# Patient Record
Sex: Female | Born: 1960 | Race: White | Hispanic: No | Smoking: Former smoker
Health system: Southern US, Community
[De-identification: ages and names within clinical notes are randomized; demographics above are authoritative.]

## PROBLEM LIST (undated history)

## (undated) DIAGNOSIS — J449 Chronic obstructive pulmonary disease, unspecified: Secondary | ICD-10-CM

## (undated) DIAGNOSIS — E039 Hypothyroidism, unspecified: Secondary | ICD-10-CM

## (undated) DIAGNOSIS — I10 Essential (primary) hypertension: Secondary | ICD-10-CM

## (undated) DIAGNOSIS — I2510 Atherosclerotic heart disease of native coronary artery without angina pectoris: Secondary | ICD-10-CM

## (undated) DIAGNOSIS — I2699 Other pulmonary embolism without acute cor pulmonale: Secondary | ICD-10-CM

## (undated) HISTORY — PX: LEFT HEART CATH: CATH118248

---

## 1998-09-12 ENCOUNTER — Encounter: Admission: RE | Admit: 1998-09-12 | Discharge: 1998-12-11 | Payer: Self-pay | Admitting: Anesthesiology

## 2002-12-08 DIAGNOSIS — I2510 Atherosclerotic heart disease of native coronary artery without angina pectoris: Secondary | ICD-10-CM

## 2002-12-08 HISTORY — DX: Atherosclerotic heart disease of native coronary artery without angina pectoris: I25.10

## 2003-04-18 ENCOUNTER — Encounter: Payer: Self-pay | Admitting: Cardiovascular Disease

## 2003-04-18 ENCOUNTER — Inpatient Hospital Stay (HOSPITAL_COMMUNITY): Admission: EM | Admit: 2003-04-18 | Discharge: 2003-04-19 | Payer: Self-pay | Admitting: Emergency Medicine

## 2004-06-28 ENCOUNTER — Encounter: Admission: RE | Admit: 2004-06-28 | Discharge: 2004-06-28 | Payer: Self-pay | Admitting: Internal Medicine

## 2015-05-09 ENCOUNTER — Emergency Department (HOSPITAL_BASED_OUTPATIENT_CLINIC_OR_DEPARTMENT_OTHER): Payer: Self-pay

## 2015-05-09 ENCOUNTER — Emergency Department (HOSPITAL_BASED_OUTPATIENT_CLINIC_OR_DEPARTMENT_OTHER)
Admission: EM | Admit: 2015-05-09 | Discharge: 2015-05-09 | Disposition: A | Discharge status: Home / Self Care | Payer: Self-pay | Attending: Emergency Medicine | Admitting: Emergency Medicine

## 2015-05-09 ENCOUNTER — Encounter (HOSPITAL_BASED_OUTPATIENT_CLINIC_OR_DEPARTMENT_OTHER): Payer: Self-pay | Admitting: *Deleted

## 2015-05-09 DIAGNOSIS — L539 Erythematous condition, unspecified: Secondary | ICD-10-CM

## 2015-05-09 DIAGNOSIS — I2510 Atherosclerotic heart disease of native coronary artery without angina pectoris: Secondary | ICD-10-CM | POA: Insufficient documentation

## 2015-05-09 DIAGNOSIS — J449 Chronic obstructive pulmonary disease, unspecified: Secondary | ICD-10-CM | POA: Insufficient documentation

## 2015-05-09 DIAGNOSIS — L03119 Cellulitis of unspecified part of limb: Secondary | ICD-10-CM

## 2015-05-09 DIAGNOSIS — L03113 Cellulitis of right upper limb: Secondary | ICD-10-CM | POA: Insufficient documentation

## 2015-05-09 HISTORY — DX: Atherosclerotic heart disease of native coronary artery without angina pectoris: I25.10

## 2015-05-09 HISTORY — DX: Chronic obstructive pulmonary disease, unspecified: J44.9

## 2015-05-09 LAB — BASIC METABOLIC PANEL
Anion gap: 8 (ref 5–15)
BUN: 12 mg/dL (ref 6–20)
CALCIUM: 9.7 mg/dL (ref 8.9–10.3)
CHLORIDE: 107 mmol/L (ref 101–111)
CO2: 27 mmol/L (ref 22–32)
CREATININE: 0.57 mg/dL (ref 0.44–1.00)
GFR calc Af Amer: >60 mL/min (ref >60)
GFR calc non Af Amer: >60 mL/min (ref >60)
Glucose, Bld: 99 mg/dL (ref 65–99)
Potassium: 4.3 mmol/L (ref 3.5–5.1)
Sodium: 142 mmol/L (ref 135–145)

## 2015-05-09 LAB — CBC
HCT: 45.1 % (ref 36.0–46.0)
HEMOGLOBIN: 15.4 g/dL — AB (ref 12.0–15.0)
MCH: 30.8 pg (ref 26.0–34.0)
MCHC: 34.1 g/dL (ref 30.0–36.0)
MCV: 90.2 fL (ref 78.0–100.0)
Platelets: 214 10*3/uL (ref 150–400)
RBC: 5 MIL/uL (ref 3.87–5.11)
RDW: 12.9 % (ref 11.5–15.5)
WBC: 11.4 10*3/uL — ABNORMAL HIGH (ref 4.0–10.5)

## 2015-05-09 MED ORDER — CLINDAMYCIN HCL 150 MG PO CAPS
300.0000 mg | ORAL_CAPSULE | Freq: Once | ORAL | Status: AC
  Administered 2015-05-09: 300 mg via ORAL
  Filled 2015-05-09: qty 2

## 2015-05-09 MED ORDER — CLINDAMYCIN HCL 300 MG PO CAPS: 300.0000 mg | ORAL_CAPSULE | Freq: Three times a day (TID) | ORAL | Status: DC

## 2015-05-09 NOTE — ED Notes (Signed)
NP at bedside.

## 2015-05-09 NOTE — ED Notes (Addendum)
Patient states she had a dog jump up on her two days ago and now has pain, redness, and swelling in there right wrist.

## 2015-05-09 NOTE — Discharge Instructions (Signed)
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
## 2017-09-04 ENCOUNTER — Encounter (HOSPITAL_COMMUNITY): Payer: Self-pay | Admitting: *Deleted

## 2017-09-04 ENCOUNTER — Emergency Department (HOSPITAL_COMMUNITY)
Admission: EM | Admit: 2017-09-04 | Discharge: 2017-09-04 | Disposition: A | Discharge status: Home / Self Care | Payer: No Typology Code available for payment source | Attending: Emergency Medicine | Admitting: Emergency Medicine

## 2017-09-04 ENCOUNTER — Emergency Department (HOSPITAL_COMMUNITY): Payer: No Typology Code available for payment source

## 2017-09-04 DIAGNOSIS — Y999 Unspecified external cause status: Secondary | ICD-10-CM | POA: Diagnosis not present

## 2017-09-04 DIAGNOSIS — M791 Myalgia: Secondary | ICD-10-CM | POA: Diagnosis not present

## 2017-09-04 DIAGNOSIS — I2510 Atherosclerotic heart disease of native coronary artery without angina pectoris: Secondary | ICD-10-CM | POA: Insufficient documentation

## 2017-09-04 DIAGNOSIS — Y9301 Activity, walking, marching and hiking: Secondary | ICD-10-CM | POA: Insufficient documentation

## 2017-09-04 DIAGNOSIS — J449 Chronic obstructive pulmonary disease, unspecified: Secondary | ICD-10-CM | POA: Insufficient documentation

## 2017-09-04 DIAGNOSIS — T07XXXA Unspecified multiple injuries, initial encounter: Secondary | ICD-10-CM | POA: Insufficient documentation

## 2017-09-04 DIAGNOSIS — Y92481 Parking lot as the place of occurrence of the external cause: Secondary | ICD-10-CM | POA: Insufficient documentation

## 2017-09-04 DIAGNOSIS — F17210 Nicotine dependence, cigarettes, uncomplicated: Secondary | ICD-10-CM | POA: Diagnosis not present

## 2017-09-04 DIAGNOSIS — M7918 Myalgia, other site: Secondary | ICD-10-CM

## 2017-09-04 MED ORDER — NAPROXEN 500 MG PO TABS: 500.0000 mg | ORAL_TABLET | Freq: Two times a day (BID) | ORAL | 0 refills | Status: DC

## 2017-09-04 MED ORDER — METHOCARBAMOL 500 MG PO TABS: 1000.0000 mg | ORAL_TABLET | Freq: Four times a day (QID) | ORAL | 0 refills | Status: DC

## 2017-09-04 MED ORDER — METHOCARBAMOL 500 MG PO TABS
500.0000 mg | ORAL_TABLET | Freq: Once | ORAL | Status: AC
  Administered 2017-09-04: 500 mg via ORAL
  Filled 2017-09-04: qty 1

## 2017-09-04 MED ORDER — TRAMADOL HCL 50 MG PO TABS: 50.0000 mg | ORAL_TABLET | Freq: Four times a day (QID) | ORAL | 0 refills | Status: DC | PRN

## 2017-09-04 MED ORDER — OXYCODONE-ACETAMINOPHEN 5-325 MG PO TABS
1.0000 | ORAL_TABLET | Freq: Once | ORAL | Status: AC
  Administered 2017-09-04: 1 via ORAL
  Filled 2017-09-04: qty 1

## 2017-09-04 MED ORDER — LIDOCAINE 5 % EX PTCH: 1.0000 | MEDICATED_PATCH | CUTANEOUS | 0 refills | Status: DC
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
## 2017-09-04 NOTE — ED Notes (Signed)
Pt to radiology via stretcher.  
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
## 2017-09-07 ENCOUNTER — Emergency Department (HOSPITAL_COMMUNITY)
Admission: EM | Admit: 2017-09-07 | Discharge: 2017-09-08 | Disposition: A | Discharge status: Home / Self Care | Payer: No Typology Code available for payment source | Attending: Emergency Medicine | Admitting: Emergency Medicine

## 2017-09-07 ENCOUNTER — Encounter (HOSPITAL_COMMUNITY): Payer: Self-pay

## 2017-09-07 DIAGNOSIS — T148XXA Other injury of unspecified body region, initial encounter: Secondary | ICD-10-CM

## 2017-09-07 DIAGNOSIS — Y998 Other external cause status: Secondary | ICD-10-CM | POA: Diagnosis not present

## 2017-09-07 DIAGNOSIS — F17210 Nicotine dependence, cigarettes, uncomplicated: Secondary | ICD-10-CM | POA: Diagnosis not present

## 2017-09-07 DIAGNOSIS — R51 Headache: Secondary | ICD-10-CM | POA: Diagnosis not present

## 2017-09-07 DIAGNOSIS — S161XXA Strain of muscle, fascia and tendon at neck level, initial encounter: Secondary | ICD-10-CM | POA: Diagnosis not present

## 2017-09-07 DIAGNOSIS — Y92410 Unspecified street and highway as the place of occurrence of the external cause: Secondary | ICD-10-CM | POA: Insufficient documentation

## 2017-09-07 DIAGNOSIS — S199XXA Unspecified injury of neck, initial encounter: Secondary | ICD-10-CM | POA: Diagnosis present

## 2017-09-07 DIAGNOSIS — I2510 Atherosclerotic heart disease of native coronary artery without angina pectoris: Secondary | ICD-10-CM | POA: Diagnosis not present

## 2017-09-07 DIAGNOSIS — Y9301 Activity, walking, marching and hiking: Secondary | ICD-10-CM | POA: Diagnosis not present

## 2017-09-07 DIAGNOSIS — J449 Chronic obstructive pulmonary disease, unspecified: Secondary | ICD-10-CM | POA: Insufficient documentation

## 2017-09-07 HISTORY — DX: Other pulmonary embolism without acute cor pulmonale: I26.99
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
## 2017-09-07 NOTE — ED Notes (Signed)
Bed: [facility]1 Expected date:  Expected time:  Means of arrival:  Comments: 

## 2017-09-07 NOTE — ED Triage Notes (Signed)
When patient was told that she had to go tot he waiting room, her visitor became angry and stated that the patient was having chest pain, patient confirmed.

## 2017-09-08 ENCOUNTER — Encounter (HOSPITAL_COMMUNITY): Payer: Self-pay | Admitting: Emergency Medicine

## 2017-09-08 ENCOUNTER — Emergency Department (HOSPITAL_COMMUNITY): Payer: No Typology Code available for payment source

## 2017-09-08 MED ORDER — DIPHENHYDRAMINE HCL 25 MG PO CAPS
25.0000 mg | ORAL_CAPSULE | Freq: Once | ORAL | Status: AC
  Administered 2017-09-08: 25 mg via ORAL
  Filled 2017-09-08: qty 1

## 2017-09-08 MED ORDER — KETOROLAC TROMETHAMINE 60 MG/2ML IM SOLN
30.0000 mg | Freq: Once | INTRAMUSCULAR | Status: AC
  Administered 2017-09-08: 30 mg via INTRAMUSCULAR
  Filled 2017-09-08: qty 2

## 2017-09-08 MED ORDER — METOCLOPRAMIDE HCL 5 MG/ML IJ SOLN
10.0000 mg | Freq: Once | INTRAMUSCULAR | Status: AC
  Administered 2017-09-08: 10 mg via INTRAMUSCULAR
  Filled 2017-09-08: qty 2

## 2017-09-08 MED ORDER — MELOXICAM 7.5 MG PO TABS: 7.5000 mg | ORAL_TABLET | Freq: Every day | ORAL | 0 refills | Status: DC
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
## 2017-10-15 ENCOUNTER — Ambulatory Visit (INDEPENDENT_AMBULATORY_CARE_PROVIDER_SITE_OTHER): Payer: Self-pay

## 2017-10-15 ENCOUNTER — Ambulatory Visit (INDEPENDENT_AMBULATORY_CARE_PROVIDER_SITE_OTHER): Payer: Self-pay | Admitting: Orthopedic Surgery

## 2017-10-15 ENCOUNTER — Encounter (INDEPENDENT_AMBULATORY_CARE_PROVIDER_SITE_OTHER): Payer: Self-pay | Admitting: Orthopedic Surgery

## 2017-10-15 DIAGNOSIS — M25512 Pain in left shoulder: Secondary | ICD-10-CM

## 2017-10-15 DIAGNOSIS — G8929 Other chronic pain: Secondary | ICD-10-CM

## 2017-10-15 DIAGNOSIS — M545 Low back pain: Secondary | ICD-10-CM

## 2017-10-15 DIAGNOSIS — M5136 Other intervertebral disc degeneration, lumbar region: Secondary | ICD-10-CM
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
## 2017-11-04 ENCOUNTER — Ambulatory Visit (HOSPITAL_COMMUNITY)
Admission: RE | Admit: 2017-11-04 | Discharge: 2017-11-04 | Disposition: A | Discharge status: Home / Self Care | Payer: No Typology Code available for payment source | Source: Ambulatory Visit | Attending: Orthopedic Surgery | Admitting: Orthopedic Surgery

## 2017-11-04 DIAGNOSIS — M5136 Other intervertebral disc degeneration, lumbar region: Secondary | ICD-10-CM | POA: Insufficient documentation

## 2017-11-04 DIAGNOSIS — M47816 Spondylosis without myelopathy or radiculopathy, lumbar region: Secondary | ICD-10-CM | POA: Diagnosis not present

## 2017-11-04 DIAGNOSIS — M1288 Other specific arthropathies, not elsewhere classified, other specified site: Secondary | ICD-10-CM | POA: Insufficient documentation

## 2017-11-04 DIAGNOSIS — M48061 Spinal stenosis, lumbar region without neurogenic claudication: Secondary | ICD-10-CM | POA: Insufficient documentation

## 2017-11-06 ENCOUNTER — Ambulatory Visit (INDEPENDENT_AMBULATORY_CARE_PROVIDER_SITE_OTHER): Payer: Self-pay | Admitting: Orthopedic Surgery

## 2017-11-06 ENCOUNTER — Encounter (INDEPENDENT_AMBULATORY_CARE_PROVIDER_SITE_OTHER): Payer: Self-pay | Admitting: Orthopedic Surgery

## 2017-11-06 DIAGNOSIS — M545 Low back pain: Secondary | ICD-10-CM

## 2017-11-07 ENCOUNTER — Encounter (INDEPENDENT_AMBULATORY_CARE_PROVIDER_SITE_OTHER): Payer: Self-pay | Admitting: Orthopedic Surgery
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
## 2017-11-19 ENCOUNTER — Encounter (INDEPENDENT_AMBULATORY_CARE_PROVIDER_SITE_OTHER): Payer: Self-pay | Admitting: Physical Medicine and Rehabilitation

## 2017-11-19 ENCOUNTER — Ambulatory Visit (INDEPENDENT_AMBULATORY_CARE_PROVIDER_SITE_OTHER): Payer: Self-pay | Admitting: Physical Medicine and Rehabilitation

## 2017-11-19 ENCOUNTER — Ambulatory Visit (INDEPENDENT_AMBULATORY_CARE_PROVIDER_SITE_OTHER): Payer: Self-pay

## 2017-11-19 VITALS — BP 154/87 | HR 98 | Temp 98.0°F

## 2017-11-19 DIAGNOSIS — M4125 Other idiopathic scoliosis, thoracolumbar region: Secondary | ICD-10-CM | POA: Insufficient documentation

## 2017-11-19 DIAGNOSIS — M5416 Radiculopathy, lumbar region: Secondary | ICD-10-CM

## 2017-11-19 MED ORDER — LIDOCAINE HCL (PF) 1 % IJ SOLN
2.0000 mL | Freq: Once | INTRAMUSCULAR | Status: AC
  Administered 2017-11-19: 2 mL

## 2017-11-19 MED ORDER — METHYLPREDNISOLONE ACETATE 80 MG/ML IJ SUSP
80.0000 mg | Freq: Once | INTRAMUSCULAR | Status: AC
  Administered 2017-11-19: 80 mg

## 2017-11-19 NOTE — Patient Instructions (Signed)
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
## 2017-11-27 NOTE — Procedures (Signed)
Cheyenne Lucero is a 56 year old female who is followed by Dr. August Saucerean in our office and is failed conservative care for her low back and right hip pain.  She reports 8 out of 10 pain particularly into the right hip.  Her symptoms are consistent potentially with radicular pain or even facet joint mediated pain.  MRI shows severe scoliosis with facet arthropathy at L4-5 and small listhesis.  There is lateral recess narrowing but no focal nerve compression and no focal stenosis of the central canal.  The injection  will be diagnostic and hopefully therapeutic. The patient has failed conservative care including time, medications and activity modification.  Lumbar Epidural Steroid Injection - Interlaminar Approach with Fluoroscopic Guidance  Patient: Cheyenne Lucero      Date of Birth: May 19, 1961 MRN: [id number] PCP: Patient, No Pcp Per      Visit Date: 11/19/2017   Universal Protocol:     Consent Given By: the patient  Position: PRONE  Additional Comments: Vital signs were monitored before and after the procedure. Patient was prepped and draped in the usual sterile fashion. The correct patient, procedure, and site was verified.   Injection Procedure Details:  Procedure Site One Meds Administered:  Meds ordered this encounter  Medications  . lidocaine (PF) (XYLOCAINE) 1 % injection 2 mL  . methylPREDNISolone acetate (DEPO-MEDROL) injection 80 mg     Laterality: Right  Location/Site:  L5-S1  Needle size: 20 G  Needle type: Tuohy  Needle Placement: Paramedian epidural  Findings:   -Comments: Excellent flow of contrast into the epidural space.  Procedure Details: Using a paramedian approach from the side mentioned above, the region overlying the inferior lamina was localized under fluoroscopic visualization and the soft tissues overlying this structure were infiltrated with 4 ml. of 1% Lidocaine without Epinephrine. The Tuohy needle was inserted into the epidural space using a paramedian  approach.   The epidural space was localized using loss of resistance along with lateral and bi-planar fluoroscopic views.  After negative aspirate for air, blood, and CSF, a 2 ml. volume of Isovue-250 was injected into the epidural space and the flow of contrast was observed. Radiographs were obtained for documentation purposes.    The injectate was administered into the level noted above.   Additional Comments:  The patient tolerated the procedure well Dressing: Band-Aid    Post-procedure details: Patient was observed during the procedure. Post-procedure instructions were reviewed.  Patient left the clinic in stable condition.  Pertinent Imaging: MRI LUMBAR SPINE WITHOUT CONTRAST  TECHNIQUE: Multiplanar, multisequence MR imaging of the lumbar spine was performed. No intravenous contrast was administered.  COMPARISON:  None.  FINDINGS: Segmentation:  Standard.  Alignment: Severe levoscoliosis of the lumbar spine. 2 mm anterolisthesis of L4 on L5 secondary to facet disease.  Vertebrae:  No fracture, evidence of discitis, or bone lesion.  Conus medullaris and cauda equina: Conus extends to the L1 level. Conus and cauda equina appear normal.  Paraspinal and other soft tissues: No paraspinal abnormality. Large left renal cyst is noted measuring 6.2 x 3.8 cm.  Disc levels:  Disc spaces: Disc desiccation throughout the lumbar spine with reactive endplate changes along the right periphery of the L4 vertebral body.  T12-L1: No significant disc bulge. No evidence of neural foraminal stenosis. No central canal stenosis.  L1-L2: No significant disc bulge. No evidence of neural foraminal stenosis. No central canal stenosis.  L2-L3: Mild broad-based disc bulge. No evidence of neural foraminal stenosis. No central canal stenosis.  L3-L4: Mild  broad-based disc bulge. Right lateral recess narrowing. No evidence of neural foraminal stenosis. No central canal  stenosis.  L4-L5: Broad-based disc bulge flattening the ventral thecal sac. Moderate bilateral facet arthropathy with ligamentum flavum infolding. Bilateral lateral recess narrowing. No evidence of neural foraminal stenosis. Mild central canal stenosis.  L5-S1: Mild broad-based disc bulge. Mild bilateral facet arthropathy. Mild left foraminal stenosis. No central canal stenosis.  IMPRESSION: 1. At L4-5 there is a broad-based disc bulge flattening the ventral thecal sac. Moderate bilateral facet arthropathy with ligamentum flavum infolding. Bilateral lateral recess narrowing. Mild central canal stenosis. 2. Otherwise mild lumbar spine spondylosis as detailed above.   Electronically Signed   By: Elige KoHetal  Patel   On: 11/04/2017 12:17

## 2017-12-10 ENCOUNTER — Encounter (INDEPENDENT_AMBULATORY_CARE_PROVIDER_SITE_OTHER): Payer: Self-pay | Admitting: Physical Medicine and Rehabilitation

## 2017-12-10 ENCOUNTER — Ambulatory Visit (INDEPENDENT_AMBULATORY_CARE_PROVIDER_SITE_OTHER): Payer: Self-pay

## 2017-12-10 ENCOUNTER — Ambulatory Visit (INDEPENDENT_AMBULATORY_CARE_PROVIDER_SITE_OTHER): Payer: BLUE CROSS/BLUE SHIELD | Admitting: Physical Medicine and Rehabilitation

## 2017-12-10 VITALS — BP 182/96 | HR 109 | Temp 98.1°F

## 2017-12-10 DIAGNOSIS — M4125 Other idiopathic scoliosis, thoracolumbar region: Secondary | ICD-10-CM

## 2017-12-10 DIAGNOSIS — M47816 Spondylosis without myelopathy or radiculopathy, lumbar region: Secondary | ICD-10-CM | POA: Diagnosis not present

## 2017-12-10 MED ORDER — METHYLPREDNISOLONE ACETATE 80 MG/ML IJ SUSP
80.0000 mg | Freq: Once | INTRAMUSCULAR | Status: AC
  Administered 2017-12-10: 80 mg

## 2017-12-10 NOTE — Patient Instructions (Signed)
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
## 2017-12-24 ENCOUNTER — Telehealth (INDEPENDENT_AMBULATORY_CARE_PROVIDER_SITE_OTHER): Payer: Self-pay | Admitting: Radiology

## 2017-12-25 NOTE — Telephone Encounter (Signed)
See messages below. I have scheduled patient for a follow up with Dr. August Saucerean on 1/30. Injections have not helped. She wants to know if there is anything she can do until that time to help relieve her back pain.

## 2017-12-25 NOTE — Telephone Encounter (Signed)
Not[facility]

## 2017-12-25 NOTE — Telephone Encounter (Signed)
If the facet injections and epidural injection did not help her back pain at all I am not sure have anything from an interventional standpoint that would help.  She should follow-up with Dr. August Saucerean or get his input on her seeing a spine surgeon for evaluation.  Other options would be to regroup with a good physical therapist looking at more of a myofascial relief and pain relief standpoint.

## 2018-01-06 ENCOUNTER — Ambulatory Visit (INDEPENDENT_AMBULATORY_CARE_PROVIDER_SITE_OTHER): Payer: BLUE CROSS/BLUE SHIELD | Admitting: Orthopedic Surgery

## 2018-01-08 ENCOUNTER — Other Ambulatory Visit: Payer: Self-pay | Admitting: Family Medicine

## 2018-01-08 DIAGNOSIS — E039 Hypothyroidism, unspecified: Secondary | ICD-10-CM

## 2018-01-18 ENCOUNTER — Other Ambulatory Visit (HOSPITAL_COMMUNITY): Payer: Self-pay | Admitting: Family Medicine

## 2018-01-18 ENCOUNTER — Ambulatory Visit
Admission: RE | Admit: 2018-01-18 | Discharge: 2018-01-18 | Disposition: A | Discharge status: Home / Self Care | Payer: BLUE CROSS/BLUE SHIELD | Source: Ambulatory Visit | Attending: Family Medicine | Admitting: Family Medicine

## 2018-01-18 DIAGNOSIS — E039 Hypothyroidism, unspecified: Secondary | ICD-10-CM

## 2018-01-21 ENCOUNTER — Other Ambulatory Visit (HOSPITAL_COMMUNITY): Payer: Self-pay | Admitting: Family Medicine

## 2018-01-21 DIAGNOSIS — E0590 Thyrotoxicosis, unspecified without thyrotoxic crisis or storm: Secondary | ICD-10-CM

## 2018-01-25 ENCOUNTER — Other Ambulatory Visit (HOSPITAL_COMMUNITY): Payer: Self-pay | Admitting: Family Medicine

## 2018-01-25 DIAGNOSIS — E0590 Thyrotoxicosis, unspecified without thyrotoxic crisis or storm: Secondary | ICD-10-CM

## 2018-01-26 ENCOUNTER — Ambulatory Visit (HOSPITAL_COMMUNITY)
Admission: RE | Admit: 2018-01-26 | Discharge: 2018-01-26 | Disposition: A | Discharge status: Home / Self Care | Payer: BLUE CROSS/BLUE SHIELD | Source: Ambulatory Visit | Attending: Family Medicine | Admitting: Family Medicine

## 2018-01-26 DIAGNOSIS — E0590 Thyrotoxicosis, unspecified without thyrotoxic crisis or storm: Secondary | ICD-10-CM | POA: Diagnosis present

## 2018-01-27 ENCOUNTER — Encounter (HOSPITAL_COMMUNITY)
Admission: RE | Admit: 2018-01-27 | Discharge: 2018-01-27 | Disposition: A | Discharge status: Home / Self Care | Payer: BLUE CROSS/BLUE SHIELD | Source: Ambulatory Visit | Attending: Family Medicine | Admitting: Family Medicine

## 2018-01-27 MED ORDER — SODIUM PERTECHNETATE TC 99M INJECTION
10.0000 | Freq: Once | INTRAVENOUS | Status: AC | PRN
  Administered 2018-01-27: 10 via INTRAVENOUS

## 2018-01-27 MED ORDER — SODIUM IODIDE I 131 CAPSULE
10.5000 | Freq: Once | INTRAVENOUS | Status: AC | PRN
  Administered 2018-01-26: 10.5 via ORAL

## 2018-01-28 ENCOUNTER — Ambulatory Visit (HOSPITAL_COMMUNITY)
Admission: RE | Admit: 2018-01-28 | Discharge: 2018-01-28 | Disposition: A | Discharge status: Home / Self Care | Payer: BLUE CROSS/BLUE SHIELD | Source: Ambulatory Visit | Attending: Family Medicine | Admitting: Family Medicine

## 2018-01-28 DIAGNOSIS — E0590 Thyrotoxicosis, unspecified without thyrotoxic crisis or storm: Secondary | ICD-10-CM | POA: Diagnosis not present

## 2018-01-28 LAB — HCG, SERUM, QUALITATIVE: Preg, Serum: NEGATIVE

## 2018-01-28 MED ORDER — SODIUM IODIDE I 131 CAPSULE
18.3000 | Freq: Once | INTRAVENOUS | Status: AC | PRN
  Administered 2018-01-28: 18.3 via ORAL

## 2018-02-06 ENCOUNTER — Encounter (HOSPITAL_COMMUNITY): Payer: Self-pay

## 2018-02-06 ENCOUNTER — Inpatient Hospital Stay (HOSPITAL_COMMUNITY)
Admission: EM | Admit: 2018-02-06 | Discharge: 2018-02-09 | DRG: 645 | Disposition: A | Discharge status: Home / Self Care | Payer: BLUE CROSS/BLUE SHIELD | Attending: Internal Medicine | Admitting: Internal Medicine

## 2018-02-06 DIAGNOSIS — F419 Anxiety disorder, unspecified: Secondary | ICD-10-CM | POA: Diagnosis present

## 2018-02-06 DIAGNOSIS — I2510 Atherosclerotic heart disease of native coronary artery without angina pectoris: Secondary | ICD-10-CM | POA: Diagnosis present

## 2018-02-06 DIAGNOSIS — E0590 Thyrotoxicosis, unspecified without thyrotoxic crisis or storm: Secondary | ICD-10-CM | POA: Diagnosis not present

## 2018-02-06 DIAGNOSIS — E0500 Thyrotoxicosis with diffuse goiter without thyrotoxic crisis or storm: Secondary | ICD-10-CM | POA: Diagnosis not present

## 2018-02-06 DIAGNOSIS — E039 Hypothyroidism, unspecified: Secondary | ICD-10-CM | POA: Insufficient documentation

## 2018-02-06 DIAGNOSIS — F17210 Nicotine dependence, cigarettes, uncomplicated: Secondary | ICD-10-CM | POA: Diagnosis present

## 2018-02-06 DIAGNOSIS — F329 Major depressive disorder, single episode, unspecified: Secondary | ICD-10-CM | POA: Diagnosis present

## 2018-02-06 DIAGNOSIS — R002 Palpitations: Secondary | ICD-10-CM | POA: Diagnosis present

## 2018-02-06 DIAGNOSIS — R110 Nausea: Secondary | ICD-10-CM | POA: Diagnosis present

## 2018-02-06 DIAGNOSIS — Z86711 Personal history of pulmonary embolism: Secondary | ICD-10-CM

## 2018-02-06 DIAGNOSIS — F411 Generalized anxiety disorder: Secondary | ICD-10-CM | POA: Diagnosis present

## 2018-02-06 DIAGNOSIS — G8929 Other chronic pain: Secondary | ICD-10-CM | POA: Diagnosis present

## 2018-02-06 DIAGNOSIS — M545 Low back pain: Secondary | ICD-10-CM | POA: Diagnosis present

## 2018-02-06 DIAGNOSIS — J449 Chronic obstructive pulmonary disease, unspecified: Secondary | ICD-10-CM | POA: Diagnosis present

## 2018-02-06 DIAGNOSIS — I10 Essential (primary) hypertension: Secondary | ICD-10-CM | POA: Diagnosis present

## 2018-02-06 DIAGNOSIS — R001 Bradycardia, unspecified: Secondary | ICD-10-CM | POA: Diagnosis not present

## 2018-02-06 LAB — COMPREHENSIVE METABOLIC PANEL
ALBUMIN: 4.3 g/dL (ref 3.5–5.0)
ALK PHOS: 130 U/L — AB (ref 38–126)
ALT: 34 U/L (ref 14–54)
AST: 30 U/L (ref 15–41)
Anion gap: 12 (ref 5–15)
BILIRUBIN TOTAL: 1 mg/dL (ref 0.3–1.2)
BUN: 10 mg/dL (ref 6–20)
CO2: 20 mmol/L — ABNORMAL LOW (ref 22–32)
CREATININE: 0.62 mg/dL (ref 0.44–1.00)
Calcium: 10.3 mg/dL (ref 8.9–10.3)
Chloride: 108 mmol/L (ref 101–111)
GFR calc Af Amer: >60 mL/min (ref >60)
GFR calc non Af Amer: >60 mL/min (ref >60)
GLUCOSE: 102 mg/dL — AB (ref 65–99)
POTASSIUM: 3.9 mmol/L (ref 3.5–5.1)
Sodium: 140 mmol/L (ref 135–145)
Total Protein: 7.6 g/dL (ref 6.5–8.1)

## 2018-02-06 LAB — CBC WITH DIFFERENTIAL/PLATELET
BASOS ABS: 0 10*3/uL (ref 0.0–0.1)
BASOS PCT: 0 %
Eosinophils Absolute: 0 10*3/uL (ref 0.0–0.7)
Eosinophils Relative: 0 %
HEMATOCRIT: 48.4 % — AB (ref 36.0–46.0)
HEMOGLOBIN: 17.4 g/dL — AB (ref 12.0–15.0)
LYMPHS PCT: 33 %
Lymphs Abs: 3.2 10*3/uL (ref 0.7–4.0)
MCH: 32.3 pg (ref 26.0–34.0)
MCHC: 36 g/dL (ref 30.0–36.0)
MCV: 90 fL (ref 78.0–100.0)
Monocytes Absolute: 0.6 10*3/uL (ref 0.1–1.0)
Monocytes Relative: 6 %
NEUTROS ABS: 6 10*3/uL (ref 1.7–7.7)
NEUTROS PCT: 61 %
Platelets: 243 10*3/uL (ref 150–400)
RBC: 5.38 MIL/uL — AB (ref 3.87–5.11)
RDW: 13.4 % (ref 11.5–15.5)
WBC: 9.9 10*3/uL (ref 4.0–10.5)

## 2018-02-06 LAB — T4, FREE: Free T4: 3.06 ng/dL — ABNORMAL HIGH (ref 0.61–1.12)

## 2018-02-06 LAB — MAGNESIUM: Magnesium: 1.9 mg/dL (ref 1.7–2.4)

## 2018-02-06 LAB — I-STAT TROPONIN, ED: Troponin i, poc: 0 ng/mL (ref 0.00–0.08)

## 2018-02-06 LAB — TSH: TSH: <0.01 u[IU]/mL — ABNORMAL LOW (ref 0.350–4.500)

## 2018-02-06 LAB — PHOSPHORUS: Phosphorus: <1 mg/dL — CL (ref 2.5–4.6)

## 2018-02-06 MED ORDER — NEBIVOLOL HCL 10 MG PO TABS
10.0000 mg | ORAL_TABLET | Freq: Two times a day (BID) | ORAL | Status: DC
  Administered 2018-02-06 – 2018-02-07 (×2): 10 mg via ORAL
  Filled 2018-02-06 (×2): qty 1

## 2018-02-06 MED ORDER — SODIUM CHLORIDE 0.9 % IV BOLUS (SEPSIS)
1000.0000 mL | Freq: Once | INTRAVENOUS | Status: AC
  Administered 2018-02-06: 1000 mL via INTRAVENOUS

## 2018-02-06 MED ORDER — METHIMAZOLE 10 MG PO TABS
20.0000 mg | ORAL_TABLET | Freq: Two times a day (BID) | ORAL | Status: DC
  Administered 2018-02-06 – 2018-02-09 (×6): 20 mg via ORAL
  Filled 2018-02-06 (×6): qty 2

## 2018-02-06 MED ORDER — K PHOS MONO-SOD PHOS DI & MONO 155-852-130 MG PO TABS
500.0000 mg | ORAL_TABLET | Freq: Once | ORAL | Status: AC
  Administered 2018-02-06: 500 mg via ORAL
  Filled 2018-02-06: qty 2

## 2018-02-06 MED ORDER — SODIUM CHLORIDE 0.9 % IV SOLN: Freq: Once | INTRAVENOUS | Status: DC

## 2018-02-06 MED ORDER — CLONAZEPAM 0.5 MG PO TABS
0.5000 mg | ORAL_TABLET | Freq: Two times a day (BID) | ORAL | Status: DC | PRN
  Administered 2018-02-07 – 2018-02-09 (×4): 0.5 mg via ORAL
  Filled 2018-02-06 (×4): qty 1

## 2018-02-06 MED ORDER — PROPRANOLOL HCL 1 MG/ML IV SOLN
1.0000 mg | Freq: Once | INTRAVENOUS | Status: AC
  Administered 2018-02-06: 1 mg via INTRAVENOUS
  Filled 2018-02-06: qty 1

## 2018-02-06 MED ORDER — ALPRAZOLAM 0.25 MG PO TABS
0.2500 mg | ORAL_TABLET | Freq: Every evening | ORAL | Status: DC | PRN
  Administered 2018-02-06: 0.25 mg via ORAL
  Filled 2018-02-06: qty 1

## 2018-02-06 MED ORDER — ACETAMINOPHEN 325 MG PO TABS: 650.0000 mg | ORAL_TABLET | Freq: Four times a day (QID) | ORAL | Status: DC | PRN

## 2018-02-06 MED ORDER — METHYLPREDNISOLONE SODIUM SUCC 125 MG IJ SOLR
125.0000 mg | Freq: Once | INTRAMUSCULAR | Status: AC
  Administered 2018-02-06: 125 mg via INTRAVENOUS
  Filled 2018-02-06: qty 2

## 2018-02-06 MED ORDER — ALPRAZOLAM 0.25 MG PO TABS: 0.2500 mg | ORAL_TABLET | Freq: Every day | ORAL | Status: DC

## 2018-02-06 MED ORDER — VITAMIN D 1000 UNITS PO TABS
2000.0000 [IU] | ORAL_TABLET | Freq: Every day | ORAL | Status: DC
  Administered 2018-02-07 – 2018-02-09 (×3): 2000 [IU] via ORAL
  Filled 2018-02-06 (×4): qty 2

## 2018-02-06 MED ORDER — METHIMAZOLE 10 MG PO TABS
10.0000 mg | ORAL_TABLET | Freq: Two times a day (BID) | ORAL | Status: DC
  Filled 2018-02-06: qty 1

## 2018-02-06 MED ORDER — CLONIDINE HCL 0.1 MG PO TABS
0.1000 mg | ORAL_TABLET | Freq: Every day | ORAL | Status: DC
  Administered 2018-02-06 – 2018-02-08 (×3): 0.1 mg via ORAL
  Filled 2018-02-06 (×3): qty 1

## 2018-02-06 MED ORDER — NICOTINE 14 MG/24HR TD PT24
14.0000 mg | MEDICATED_PATCH | Freq: Every day | TRANSDERMAL | Status: DC
  Administered 2018-02-06 – 2018-02-09 (×4): 14 mg via TRANSDERMAL
  Filled 2018-02-06 (×4): qty 1

## 2018-02-06 MED ORDER — IBUPROFEN 400 MG PO TABS
400.0000 mg | ORAL_TABLET | Freq: Four times a day (QID) | ORAL | Status: DC | PRN
  Administered 2018-02-06: 400 mg via ORAL
  Filled 2018-02-06: qty 1

## 2018-02-06 MED ORDER — LORAZEPAM 2 MG/ML IJ SOLN
1.0000 mg | Freq: Once | INTRAMUSCULAR | Status: AC
  Administered 2018-02-06: 1 mg via INTRAVENOUS
  Filled 2018-02-06: qty 1

## 2018-02-06 MED ORDER — ENOXAPARIN SODIUM 40 MG/0.4ML ~~LOC~~ SOLN
40.0000 mg | SUBCUTANEOUS | Status: DC
  Administered 2018-02-07 – 2018-02-08 (×2): 40 mg via SUBCUTANEOUS
  Filled 2018-02-06 (×3): qty 0.4

## 2018-02-06 NOTE — ED Triage Notes (Signed)
Pt brought in by EMS from MD office due to pt having abnormal TSH and T4 labs. Pt had thyroid ablation last Thursday for Hyperthyroidism. Pt c/o tremors, SOB, and CP. Pt c/o right sided neck swelling. Pt a&ox4. Pt sent here for concern of thyroid storm.

## 2018-02-06 NOTE — ED Notes (Signed)
Date and time results received: 02/06/18 13:25 (use smartphrase ".now" to insert current time)  Test: Phosphorus  Critical Value: <1.0  Name of Provider Notified: Dr. Silverio LayYao  Orders Received? Or Actions Taken?: New orders received

## 2018-02-06 NOTE — H&P (Addendum)
Date: 02/06/2018               Patient Name:  Cheyenne Lucero MRN: [id number]  DOB: [birth year] Age / Sex: 57 y.o., female   PCP: Dois Davenport, MD         Medical Service: Internal Medicine Teaching Service         Attending Physician: Dr. Levert Feinstein, MD    First Contact: Dr. Evelene Croon Pager: [phone]  Second Contact: Dr. Samuella Cota Pager: [phone]       After Hours (After 5p/  First Contact Pager: [phone]  weekends / holidays): Second Contact Pager: [phone]   Chief Complaint: Chest pain, tremors   History of Present Illness:  Cheyenne Lucero is a 57 yo female with history of Grave's disease s/p RAI 2/21, HTN, COPD, chronic LBP, and MDD who presents to the ED per PCP's advise due to concern for thyroid storm. Patient was recently diagnosed with Graves' disease and underwent radioactive iodine ablation on 2/21. States she had been doing well after the procedure until 1.5 weeks ago when she noticed tremors on upper extremities, chest pain, blurry vision, and sore throat (which is expected after RAI).  Symptoms increase in frequency she went to her PCPs office yesterday (3/1) for lab work which revealed an undetectable TSH and T4 of 19.  She was then started on methimazole and sent to the ED due to concern for thyroid storm. She denies recent illness, fever, chills, HA, palpitations, abdominal pain, N/V, changes in bowel movements, and LE edema.   ED course: On arrival patient was afebrile, HR 64, normotensive, and oxygenating well on room air.  Labs remarkable for undetectable TSH and free T4 3, undetectable phosphorus and hemoconcentration Hgb 17.4. CMP unremarkable.  She received 1L NS bolus, methylprednisolone 125 mg x1, K phos 500 ng x1, IV Ativan 1 mg x1, and IV propanolol 1 mg x1.   Review of Systems: A complete ROS was negative except as per HPI.   Meds:  Current Meds  Medication Sig  . acetaminophen (TYLENOL) 325 MG tablet Take 650 mg by mouth every 6 (six) hours as needed  for mild pain.  Marland Kitchen ALPRAZolam (XANAX) 0.25 MG tablet Take 0.25 mg by mouth at bedtime.  . chlorhexidine (PERIDEX) 0.12 % solution 15 mLs by Mouth Rinse route daily as needed.   . Cholecalciferol (VITAMIN D3) 2000 units TABS Take 1 tablet by mouth daily.  . clonazePAM (KLONOPIN) 0.5 MG tablet Take 0.5 mg by mouth daily as needed for anxiety.  . cloNIDine (CATAPRES) 0.1 MG tablet Take 0.1 mg by mouth at bedtime.  . methimazole (TAPAZOLE) 5 MG tablet Take 10 mg by mouth 2 (two) times daily.  . nebivolol (BYSTOLIC) 10 MG tablet Take 10 mg by mouth 2 (two) times daily.    Allergies: Allergies as of 02/06/2018 - Review Complete 02/06/2018  Allergen Reaction Noted  . Morphine and related Anaphylaxis 05/09/2015  . Tramadol Nausea And Vomiting 09/07/2017   Past Medical History:  Diagnosis Date  . COPD (chronic obstructive pulmonary disease) (HCC)   . Coronary artery disease 2004   heart event from a blood clot   . Pulmonary embolus (HCC)     Family History:  Family History  Problem Relation Age of Onset  . Lupus Mother     Social History:  Social History   Tobacco Use  . Smoking status: Current Every Day Smoker    Packs/day: 0.45    Types: Cigarettes  .  Smokeless tobacco: Never Used  . Tobacco comment: Pt states she is ready to quit smoking  Substance Use Topics  . Alcohol use: No  . Drug use: No    Physical Exam: Blood pressure (!) 183/96, pulse 76, temperature 97.8 F (36.6 C), temperature source Oral, resp. rate 20, height 5\' 4"  (1.626 m), weight 112 lb 14.4 oz (51.2 kg), SpO2 97 %.  Physical Exam  Constitutional: She is oriented to person, place, and time. She appears well-developed and well-nourished. No distress.  HENT:  Head: Normocephalic and atraumatic.  Mouth/Throat: Oropharynx is clear and moist. No oropharyngeal exudate.  No goiter noted   Eyes:  No exophthalmos was noted  Neck: Normal range of motion. Neck supple. No thyromegaly present.  Brisk R carotid  pulse, no carotid bruit noted   Cardiovascular: Normal rate, regular rhythm, normal heart sounds and intact distal pulses. Exam reveals no gallop and no friction rub.  No murmur heard. Pulmonary/Chest: Breath sounds normal. No respiratory distress. She has no wheezes. She has no rales.  Abdominal: Soft. Bowel sounds are normal. She exhibits no distension. There is no tenderness.  Musculoskeletal: Normal range of motion. She exhibits no edema.  Neurological: She is alert and oriented to person, place, and time.  Skin: Skin is warm. No rash noted. She is not diaphoretic. No erythema.    EKG: personally reviewed my interpretation is SR, nl intervals, no signs of acute ischemia. Prior ones from 09/2017 with ST and RAD   CXR: none obtained   Assessment & Plan by Problem:  [name] is a 57 yo female with history of Grave's disease s/p RAI 2/21, HTN, COPD, chronic LBP, and MDD who presented to the ED per PCP's advise due to concern for thyroid storm.    # Hyperthyroidism: Patient has a history of Grave's disease and is s/p RAI on 2/21. She has been experiencing tremors, chest pain, blurry vision and sore throat. Labs collected at PCP's office on 3/1 revealed undetectable TSH and T4 19. She was sent to the ED due to concern for thyroid storm. However, low suspicion for this at this time as she is alert, hemodynamically stable, and responding well to symptomatic treatment.  Suspect this is persistent hyperthyroidism after RAI.  Radiation thyroiditis also differential given recent RAI (usually seen 1-3 weeks after abltation), but would expect painful goiter. Per literature, symptomatic management is indicated at this time with follow-up TSH and free T4 in 4-6 weeks.  Continue to monitor patient as she could be at risk for developing thyroid storm. - Follow up T3 - Continue methimazole 10 mg QD  - Continue Bystolic 10 mg BID  - Continue home Xanax and Klonopin as below  - Will continue to monitor for  symptoms   # HTN:  - Continue home clonidine 0.1 mg QD   # Anxiety:  - Continue home Xanax 0.25 mg QHS PRN. May increase as she is on 1 mg at home.  - Continue home Klonopin 0.5 mg BID PRN  - Continue home clonidine 0.1 mg QD   # Tobacco abuse: Smokes ~0.5 ppd, usually less.   - Nicotine patch 14 mg QD  - Encouraged smoking cessation   F: none  E: monitoring  N: HH diet   VTE ppx: SQ Lovenox  Code status: Full code, not confirmed on admission    Dispo: Admit patient to Observation with expected length of stay less than 2 midnights.  Signed: Burna CashSantos-Sanchez, Mathieu Schloemer, MD 02/06/2018, 7:24 PM  Pager:  [phone]  

## 2018-02-06 NOTE — ED Provider Notes (Signed)
[facility] EMERGENCY DEPARTMENT Provider Note   CSN: [id number] Arrival date & time: 02/06/18  1219     History   Chief Complaint Chief Complaint  Patient presents with  . Abnormal Lab    TSH & T4- From MD office    HPI Cheyenne Lucero is a 57 y.o. female history of previous PE but not on anticoagulation, Graves' disease, here presenting with symptomatic hyperthyroidism.  Patient was diagnosed with Graves' disease recently by her doctor based on lab tests and clinical exam.  Patient had radioactive iodine ablation done on 2/21.  She was doing well but then about several days ago she started having palpitations and jittery feeling.  She went back to the office 2 days ago and labs came back today that showed TSH that is undetectably low and free T4 is 20.  Patient started back on methimazole yesterday and is currently on Bystolic but she still felt jittery and shaky.  She has some mild neck pain but denies trouble swallowing or significant swelling or fevers. She is sent for symptomatic hypethyroidism vs thyroid storm.   The history is provided by the patient and the spouse.    Past Medical History:  Diagnosis Date  . COPD (chronic obstructive pulmonary disease) (HCC)   . Coronary artery disease 2004   heart event from a blood clot   . Pulmonary embolus [facility])     Patient Active Problem List   Diagnosis Date Noted  . Lumbar radiculopathy 11/19/2017  . Other idiopathic scoliosis, thoracolumbar region 11/19/2017    Past Surgical History:  Procedure Laterality Date  . LEFT HEART CATH      OB History    No data available       Home Medications    Prior to Admission medications   Medication Sig Start Date End Date Taking? Authorizing Provider  clindamycin (CLEOCIN) 300 MG capsule Take 1 capsule (300 mg total) by mouth 3 (three) times daily. Patient not taking: Reported on 11/19/2017 05/09/15   Teressa Lower, NP  lidocaine (LIDODERM) 5 % Place 1 patch  onto the skin daily. Remove & Discard patch within 12 hours or as directed by MD Patient not taking: Reported on 11/19/2017 09/04/17   Antony Madura, PA-C  meloxicam (MOBIC) 7.5 MG tablet Take 1 tablet (7.5 mg total) by mouth daily. Patient not taking: Reported on 11/19/2017 09/08/17   Palumbo, April, MD  methocarbamol (ROBAXIN) 500 MG tablet Take 2 tablets (1,000 mg total) by mouth 4 (four) times daily. Patient not taking: Reported on 11/19/2017 09/04/17   Renne Crigler, PA-C  naproxen (NAPROSYN) 500 MG tablet Take 1 tablet (500 mg total) by mouth 2 (two) times daily. Patient not taking: Reported on 11/19/2017 09/04/17   Antony Madura, PA-C    Family History Family History  Problem Relation Age of Onset  . Lupus Mother     Social History Social History   Tobacco Use  . Smoking status: Current Every Day Smoker    Packs/day: 0.45    Types: Cigarettes  . Smokeless tobacco: Never Used  . Tobacco comment: Pt states she is ready to quit smoking  Substance Use Topics  . Alcohol use: No  . Drug use: No     Allergies   Morphine and related and Tramadol   Review of Systems Review of Systems  Cardiovascular: Positive for palpitations.  Psychiatric/Behavioral: The patient is nervous/anxious.   All other systems reviewed and are negative.    Physical Exam Updated Vital Signs  BP (!) 134/106   Pulse 66   Temp 97.6 F (36.4 C) (Oral)   Resp 16   Ht 5\' 4"  (1.626 m)   Wt 50.3 kg (111 lb)   SpO2 97%   BMI 19.05 kg/m   Physical Exam  Constitutional: She is oriented to person, place, and time.  Anxious, tremulous   HENT:  Head: Normocephalic.  MM slightly dry   Eyes: Conjunctivae and EOM are normal. Pupils are equal, round, and reactive to light.  Neck: Normal range of motion. Neck supple.  Cardiovascular: Normal rate, regular rhythm and normal heart sounds.  Pulmonary/Chest: Effort normal and breath sounds normal. No stridor. No respiratory distress. She has no wheezes.    Abdominal: Soft. Bowel sounds are normal. She exhibits no distension. There is no tenderness. There is no guarding.  Musculoskeletal: Normal range of motion.  Neurological: She is alert and oriented to person, place, and time. She displays normal reflexes. No cranial nerve deficit. Coordination normal.  Skin: Skin is warm.  Psychiatric: She has a normal mood and affect.  Anxious   Nursing note and vitals reviewed.    ED Treatments / Results  Labs (all labs ordered are listed, but only abnormal results are displayed) Labs Reviewed  PHOSPHORUS - Abnormal; Notable for the following components:      Result Value   Phosphorus <1.0 (*)    All other components within normal limits  CBC WITH DIFFERENTIAL/PLATELET - Abnormal; Notable for the following components:   RBC 5.38 (*)    Hemoglobin 17.4 (*)    HCT 48.4 (*)    All other components within normal limits  COMPREHENSIVE METABOLIC PANEL - Abnormal; Notable for the following components:   CO2 20 (*)    Glucose, Bld 102 (*)    Alkaline Phosphatase 130 (*)    All other components within normal limits  TSH - Abnormal; Notable for the following components:   TSH <0.010 (*)    All other components within normal limits  T4, FREE - Abnormal; Notable for the following components:   Free T4 3.06 (*)    All other components within normal limits  MAGNESIUM  T3  I-STAT TROPONIN, ED    EKG  EKG Interpretation  Date/Time:  Saturday February 06 2018 12:21:24 EST Ventricular Rate:  75 PR Interval:    QRS Duration: 93 QT Interval:  581 QTC Calculation: 650 R Axis:   94 Text Interpretation:  Sinus rhythm Borderline right axis deviation ST depr, consider ischemia, inferior leads Prolonged QT interval Baseline wander in lead(s) II III aVR aVL aVF V3 V4 V5 V6 No significant change since last tracing Confirmed by Richardean CanalYao, Zakhai Meisinger H [phone]([id number]) on 02/06/2018 12:24:53 PM       Radiology No results found.  Procedures Procedures (including critical care  time)  CRITICAL CARE Performed by: Richardean Canalavid H Gisselle Galvis   Total critical care time: 30 minutes  Critical care time was exclusive of separately billable procedures and treating other patients.  Critical care was necessary to treat or prevent imminent or life-threatening deterioration.  Critical care was time spent personally by me on the following activities: development of treatment plan with patient and/or surrogate as well as nursing, discussions with consultants, evaluation of patient's response to treatment, examination of patient, obtaining history from patient or surrogate, ordering and performing treatments and interventions, ordering and review of laboratory studies, ordering and review of radiographic studies, pulse oximetry and re-evaluation of patient's condition.   Medications Ordered in ED Medications  phosphorus (K PHOS NEUTRAL) tablet 500 mg (not administered)  methylPREDNISolone sodium succinate (SOLU-MEDROL) 125 mg/2 mL injection 125 mg (not administered)  sodium chloride 0.9 % bolus 1,000 mL (0 mLs Intravenous Stopped 02/06/18 1406)  propranolol (INDERAL) injection 1 mg (1 mg Intravenous Given 02/06/18 1410)  LORazepam (ATIVAN) injection 1 mg (1 mg Intravenous Given 02/06/18 1345)     Initial Impression / Assessment and Plan / ED Course  I have reviewed the triage vital signs and the nursing notes.  Pertinent labs & imaging results that were available during my care of the patient were reviewed by me and considered in my medical decision making (see chart for details).     Nirvi Lennon is a 57 y.o. female with recent radioactive ablation here with symptomatic hyperthyroidism. Reviewed labs from clinic. TSH < 0.01, T4 19. She is not tachycardic and has no fever. Will repeat TSH, free T4, T3, labs. Will hydrate and give IV propanolol and ativan. She just started back on methimazole already   2:15pm Phos < 1. TSH < 0.01, free T4 3. Given propanolol and ativan and IV steroids. Will  admit for observation and symptom control.     Final Clinical Impressions(s) / ED Diagnoses   Final diagnoses:  None    ED Discharge Orders    None       Charlynne Pander, MD 02/06/18 1437

## 2018-02-06 NOTE — Progress Notes (Signed)
Informed by CNA patients BP 200/162. I manually  rechecked BP and got 180/88. 2200 BP med's given. Message sent to covering HSP via Amion

## 2018-02-06 NOTE — ED Notes (Signed)
Admitting MD at bedside.

## 2018-02-07 ENCOUNTER — Other Ambulatory Visit: Payer: Self-pay

## 2018-02-07 DIAGNOSIS — I10 Essential (primary) hypertension: Secondary | ICD-10-CM | POA: Diagnosis present

## 2018-02-07 DIAGNOSIS — E0590 Thyrotoxicosis, unspecified without thyrotoxic crisis or storm: Secondary | ICD-10-CM | POA: Diagnosis present

## 2018-02-07 DIAGNOSIS — R001 Bradycardia, unspecified: Secondary | ICD-10-CM | POA: Diagnosis not present

## 2018-02-07 DIAGNOSIS — J449 Chronic obstructive pulmonary disease, unspecified: Secondary | ICD-10-CM | POA: Diagnosis present

## 2018-02-07 DIAGNOSIS — M545 Low back pain: Secondary | ICD-10-CM | POA: Diagnosis present

## 2018-02-07 DIAGNOSIS — G8929 Other chronic pain: Secondary | ICD-10-CM | POA: Diagnosis present

## 2018-02-07 DIAGNOSIS — F419 Anxiety disorder, unspecified: Secondary | ICD-10-CM | POA: Diagnosis present

## 2018-02-07 DIAGNOSIS — E0500 Thyrotoxicosis with diffuse goiter without thyrotoxic crisis or storm: Secondary | ICD-10-CM | POA: Diagnosis present

## 2018-02-07 DIAGNOSIS — F411 Generalized anxiety disorder: Secondary | ICD-10-CM | POA: Diagnosis present

## 2018-02-07 DIAGNOSIS — Z923 Personal history of irradiation: Secondary | ICD-10-CM | POA: Diagnosis not present

## 2018-02-07 DIAGNOSIS — H6123 Impacted cerumen, bilateral: Secondary | ICD-10-CM | POA: Diagnosis not present

## 2018-02-07 DIAGNOSIS — I2510 Atherosclerotic heart disease of native coronary artery without angina pectoris: Secondary | ICD-10-CM | POA: Diagnosis present

## 2018-02-07 DIAGNOSIS — R110 Nausea: Secondary | ICD-10-CM | POA: Diagnosis present

## 2018-02-07 DIAGNOSIS — R232 Flushing: Secondary | ICD-10-CM

## 2018-02-07 DIAGNOSIS — R002 Palpitations: Secondary | ICD-10-CM | POA: Diagnosis present

## 2018-02-07 DIAGNOSIS — F329 Major depressive disorder, single episode, unspecified: Secondary | ICD-10-CM

## 2018-02-07 DIAGNOSIS — F17210 Nicotine dependence, cigarettes, uncomplicated: Secondary | ICD-10-CM

## 2018-02-07 DIAGNOSIS — Z79899 Other long term (current) drug therapy: Secondary | ICD-10-CM

## 2018-02-07 DIAGNOSIS — Z86711 Personal history of pulmonary embolism: Secondary | ICD-10-CM | POA: Diagnosis not present

## 2018-02-07 LAB — CBC
HCT: 40.3 % (ref 36.0–46.0)
Hemoglobin: 14.3 g/dL (ref 12.0–15.0)
MCH: 32 pg (ref 26.0–34.0)
MCHC: 35.5 g/dL (ref 30.0–36.0)
MCV: 90.2 fL (ref 78.0–100.0)
PLATELETS: 196 10*3/uL (ref 150–400)
RBC: 4.47 MIL/uL (ref 3.87–5.11)
RDW: 13.1 % (ref 11.5–15.5)
WBC: 17 10*3/uL — ABNORMAL HIGH (ref 4.0–10.5)

## 2018-02-07 LAB — PHOSPHORUS: PHOSPHORUS: 3.1 mg/dL (ref 2.5–4.6)

## 2018-02-07 LAB — T3: T3 TOTAL: 247 ng/dL — AB (ref 71–180)

## 2018-02-07 LAB — HIV ANTIBODY (ROUTINE TESTING W REFLEX): HIV Screen 4th Generation wRfx: NONREACTIVE

## 2018-02-07 MED ORDER — NEBIVOLOL HCL 10 MG PO TABS
10.0000 mg | ORAL_TABLET | Freq: Once | ORAL | Status: AC
  Administered 2018-02-07: 10 mg via ORAL
  Filled 2018-02-07: qty 1

## 2018-02-07 MED ORDER — ALPRAZOLAM 0.25 MG PO TABS
0.2500 mg | ORAL_TABLET | Freq: Every evening | ORAL | Status: DC | PRN
  Administered 2018-02-07 – 2018-02-08 (×2): 0.5 mg via ORAL
  Filled 2018-02-07 (×2): qty 2

## 2018-02-07 MED ORDER — ALPRAZOLAM 0.25 MG PO TABS
0.2500 mg | ORAL_TABLET | Freq: Once | ORAL | Status: AC
  Administered 2018-02-07: 0.25 mg via ORAL
  Filled 2018-02-07: qty 1

## 2018-02-07 MED ORDER — NEBIVOLOL HCL 10 MG PO TABS
20.0000 mg | ORAL_TABLET | Freq: Every day | ORAL | Status: DC
  Administered 2018-02-08: 20 mg via ORAL
  Filled 2018-02-07: qty 2

## 2018-02-07 MED ORDER — NEBIVOLOL HCL 10 MG PO TABS
10.0000 mg | ORAL_TABLET | Freq: Every day | ORAL | Status: DC
  Administered 2018-02-07: 10 mg via ORAL
  Filled 2018-02-07: qty 1

## 2018-02-07 MED ORDER — ONDANSETRON HCL 4 MG/2ML IJ SOLN: 4.0000 mg | Freq: Three times a day (TID) | INTRAMUSCULAR | Status: DC | PRN

## 2018-02-07 MED ORDER — NEBIVOLOL HCL 10 MG PO TABS
10.0000 mg | ORAL_TABLET | Freq: Once | ORAL | Status: DC
  Filled 2018-02-07: qty 1

## 2018-02-07 NOTE — Progress Notes (Signed)
Patient c/o "jumpiness/ shakiness " and inability to sleep. RN reported this to teaching service, new order initiated  with instructions to page teaching service if shakiness does  Not subside

## 2018-02-07 NOTE — Discharge Summary (Signed)
Name: Cheyenne Lucero MRN: [id number] DOB: 01-Sep-1961 57 y.o. PCP: Dois Davenport, MD  Date of Admission: 02/06/2018 12:19 PM Date of Discharge: 02/09/2018 Attending Physician: Dr. Mikey Bussing   Discharge Diagnosis: 1. Hyperthyroidism   Active Problems:   Hyperthyroidism   Anxiety state   Essential hypertension   Discharge Medications: Allergies as of 02/09/2018      Reactions   Morphine And Related Anaphylaxis   Tramadol Nausea And Vomiting      Medication List    STOP taking these medications   BYSTOLIC 10 MG tablet Generic drug:  nebivolol     TAKE these medications   acetaminophen 325 MG tablet Commonly known as:  TYLENOL Take 650 mg by mouth every 6 (six) hours as needed for mild pain.   ALPRAZolam 0.25 MG tablet Commonly known as:  XANAX Take 0.25 mg by mouth at bedtime.   chlorhexidine 0.12 % solution Commonly known as:  PERIDEX 15 mLs by Mouth Rinse route daily as needed.   clonazePAM 1 MG tablet Commonly known as:  KLONOPIN Take 1 tablet (1 mg total) by mouth 2 (two) times daily as needed for anxiety. What changed:    medication strength  how much to take  when to take this   cloNIDine 0.1 MG tablet Commonly known as:  CATAPRES Take 1 tablet (0.1 mg total) by mouth 2 (two) times daily. What changed:  when to take this   methimazole 10 MG tablet Commonly known as:  TAPAZOLE Take 2 tablets (20 mg total) by mouth 2 (two) times daily. What changed:    medication strength  how much to take   propranolol 10 MG tablet Commonly known as:  INDERAL Take 1 tablet (10 mg total) by mouth 3 (three) times daily.   Vitamin D3 2000 units Tabs Take 1 tablet by mouth daily.       Disposition and follow-up:   Ms.Londan Fallen was discharged from [facility] in Stable condition.  At the hospital follow up visit please address:  1.  Please assess for ongoing symptoms of hyperthyroidism (chest pain, tremors, anxiety). Please assess for  compliance with methimazole 20 mg QD and propanolol 10 mg TID. Will need close follow up and likely further adjustments medications doses.   2.  Labs / imaging needed at time of follow-up: None. Will need repeat TSH and free T4 in 4 weeks   3.  Pending labs/ test needing follow-up: None   Follow-up Appointments: Follow-up Information    Dois Davenport, MD Follow up.   Specialty:  Family Medicine Why:  Please make sure to go to your appointment with your doctor on Thursday.  Contact information: [address] [place] [address] [phone]           Hospital Course by problem list: Active Problems:   Hyperthyroidism   Anxiety state   Essential hypertension   1. Hyperthyroidism: Patient has a history of Grave's disease s/p RAI ablation 10 days prior to presentation. One week after procedure she developed worsening chest pain, tremors, and anxiety. Thyroid studies with undetectable TSH and T4 19 and she was advice to go to the ED due to concern for thyroid storm. She was hemodynamically stable on arrival and treatment consisted on symptom management. Nevibolol initially increased with poor response in symptoms and changed to propanolol. Adjustments in dose limited by bradycardia. Klonopin increased as above during this admission for tremors and anxiety. She remained hemodynamically stable during this admission  and was advised to continue to follow up closely for PCP.    Discharge Vitals:   BP (!) 159/74 (BP Location: Right Arm)   Pulse (!) 52   Temp 98 F (36.7 C) (Oral)   Resp 16   Ht 5\' 4"  (1.626 m)   Wt 112 lb 14.4 oz (51.2 kg)   SpO2 94%   BMI 19.38 kg/m   Pertinent Labs, Studies, and Procedures:   CBC Latest Ref Rng & Units 02/07/2018 02/06/2018 05/09/2015  WBC 4.0 - 10.5 K/uL 17.0(H) 9.9 11.4(H)  Hemoglobin 12.0 - 15.0 g/dL 16.114.3 17.4(H) 15.4(H)  Hematocrit 36.0 - 46.0 % 40.3 48.4(H) 45.1  Platelets 150 - 400 K/uL 196 243 214   BMP Latest Ref Rng &  Units 02/06/2018 05/09/2015  Glucose 65 - 99 mg/dL 096(E102(H) 99  BUN 6 - 20 mg/dL 10 12  Creatinine 4.540.44 - 1.00 mg/dL 0.980.62 1.190.57  Sodium 147135 - 145 mmol/L 140 142  Potassium 3.5 - 5.1 mmol/L 3.9 4.3  Chloride 101 - 111 mmol/L 108 107  CO2 22 - 32 mmol/L 20(L) 27  Calcium 8.9 - 10.3 mg/dL 82.910.3 9.7   TSH undetectable  FT4 3.06   Discharge Instructions: Discharge Instructions    Call MD for:  difficulty breathing, headache or visual disturbances   Complete by:  As directed    Call MD for:  extreme fatigue   Complete by:  As directed    Call MD for:  persistant dizziness or light-headedness   Complete by:  As directed    Call MD for:  persistant nausea and vomiting   Complete by:  As directed    Call MD for:  temperature >100.4   Complete by:  As directed    Diet - low sodium heart healthy   Complete by:  As directed    Discharge instructions   Complete by:  As directed    Ms. Finch,   The symptoms you are experiencing can take some time to resolve after an ablation. It is important that we continue to treat symptoms while they are still occurring.   STOP taking nevibolol. We started you on propanolol 10 mg three times a day. We hope this will help with the chest pain, tachycardia, anxiety and tremors. Your doctor can increase the dose if needed.   We increase your methimazole dose to 20 mg daily. Will send a new prescription to your pharmacy for this medication.   We also increase your Klonopin dose. You will take 1 mg twice a day as needed. I sent a prescription for the next 5 days. Your doctor can refill it if you still needed after then.   Please follow up with your primary doctor within 1 week of discharge. This is very important. You will need repeat labs in 4 weeks. This includes TSH and free T4.   Consider asking your primary care doctor for a referral to an endocrinologist if your symptoms do not improve.   Please call us if you have any questions.   Increase activity  slowly   Complete by:  As directed       Signed: Burna CashSantos-Sanchez, Cadan Maggart, MD 02/10/2018, 11:49 AM   Pager: [phone]

## 2018-02-07 NOTE — Progress Notes (Signed)
   Subjective:  No acute events overnight.  Patient reports that she is feeling worse today and endorses tremulousness, intermittent chest pain, palpitations, and nausea. Discussed with patient symptoms will take time to resolve and symptom targeted treatment is best course of action at this time.  Patient is understandable but does not feel comfortable going home today.  All questions answered.  Objective:  Vital signs in last 24 hours: Vitals:   02/06/18 2216 02/07/18 0000 02/07/18 0645 02/07/18 0700  BP: (!) 180/88 (!) 150/72 (!) 132/54 (!) 160/82  Pulse:   70   Resp:   18   Temp:   97.8 F (36.6 C)   TempSrc:   Oral   SpO2:   95%   Weight:      Height:       Physical Exam  Constitutional: She is oriented to person, place, and time. She appears well-developed and well-nourished. No distress.  Appears anxious on exam. Skin flushing noted    HENT:  Head: Normocephalic and atraumatic.  Mouth/Throat: No oropharyngeal exudate.  Eyes: Conjunctivae and EOM are normal. Pupils are equal, round, and reactive to light.  No exophthalmos or lid lag retraction noted   Neck: Normal range of motion. Neck supple. No thyromegaly present.  Cardiovascular: Normal rate, regular rhythm and normal heart sounds. Exam reveals no gallop and no friction rub.  No murmur heard. Pulmonary/Chest: Effort normal and breath sounds normal. No respiratory distress. She has no wheezes. She has no rales.  Abdominal: Soft. Bowel sounds are normal. She exhibits no distension. There is no tenderness.  Musculoskeletal: Normal range of motion. She exhibits no edema.  Neurological: She is alert and oriented to person, place, and time. She displays normal reflexes. No cranial nerve deficit. Coordination normal.  Skin: Skin is warm. No rash noted. She is not diaphoretic. There is erythema.  Palmar ertyhema    Assessment/Plan:  Cheyenne Lucero is a 57 yo female with history of Grave's disease s/p RAI 2/21, HTN, COPD,  chronic LBP, and MDD who presented to the ED per PCP's advise due to concern for thyroid storm.    # Hyperthyroidism: Patient has a history of Grave's disease and is s/p RAI on 2/21. States she is feeling worse this morning. Endorses nausea and appears more tremulous on exam. Cardiac and neuro exam are reassuring. Discussed with patient symptoms will take time to resolved as ablation does not provide immediate resolution of hyperthyroidism. Will continue symptomatic management. Will increase nebivolol as below and continue to monitor. Will need repeat TSH and FT4 in 4 weeks.  - Methimazole 20 mg QD  - Bystolic 10 mg BID --> 20 mg in AM and 10 mg in PM  - Continue home Xanax and Klonopin as below  - Will continue to monitor symptoms   # HTN:  - Continue home clonidine 0.1 mg QD   # Anxiety:  - Continue home Xanax 0.25 mg QHS PRN. May increase as she is on 1 mg at home.  - Continue home Klonopin 0.5 mg BID PRN  - Continue home clonidine 0.1 mg QD   # Tobacco abuse: Smokes ~0.5 ppd, usually less.   - Nicotine patch 14 mg QD  - Encouraged smoking cessation    Dispo: Anticipated discharge in approximately 1 day(s).   Burna CashSantos-Sanchez, Cheyenne Mcmasters, MD 02/07/2018, 7:54 AM Pager: [phone]

## 2018-02-08 DIAGNOSIS — R001 Bradycardia, unspecified: Secondary | ICD-10-CM

## 2018-02-08 MED ORDER — PROPRANOLOL HCL 10 MG PO TABS
10.0000 mg | ORAL_TABLET | Freq: Three times a day (TID) | ORAL | Status: DC
  Administered 2018-02-08 – 2018-02-09 (×3): 10 mg via ORAL
  Filled 2018-02-08 (×3): qty 1

## 2018-02-08 NOTE — Progress Notes (Signed)
   Subjective:  Patient bradycardic overnight with lowest HR 40 after increase in nevibolol. This morning she feels frustrated as she believes nevibolol is not helping with her symptoms. She is asking about changing to propanolol. Discussed with patient this is an appropriate change and will change therapy today. Once again discussed will continue symptom management at this time.   Objective:  Vital signs in last 24 hours: Vitals:   02/07/18 0700 02/07/18 1309 02/07/18 2100 02/08/18 0500  BP: (!) 160/82 (!) 113/96 (!) 164/102 (!) 149/84  Pulse:  68 80 (!) 55  Resp:  17 18 18   Temp:  97.9 F (36.6 C) 97.9 F (36.6 C) 97.7 F (36.5 C)  TempSrc:  Oral Oral Oral  SpO2:  98% 95% 97%  Weight:      Height:       Physical Exam  Constitutional: She is oriented to person, place, and time. She appears well-developed and well-nourished. No distress.  Pleasant female, noted to be tremulous but in no acute distress   HENT:  Head: Normocephalic and atraumatic.  Neck: Normal range of motion. Neck supple. No thyromegaly present.  Cardiovascular: Regular rhythm and normal heart sounds. Exam reveals no gallop and no friction rub.  No murmur heard. Bradycardic   Pulmonary/Chest: Effort normal and breath sounds normal. No respiratory distress. She has no wheezes. She has no rales.  Abdominal: Soft. Bowel sounds are normal. She exhibits no distension. There is no tenderness.  Musculoskeletal: Normal range of motion. She exhibits no edema.  Neurological: She is alert and oriented to person, place, and time.  Skin: Skin is warm and dry.    Assessment/Plan:  Active Problems:   Hyperthyroidism  Alver FisherDawn Sherr is a 57 yo female with history of Grave's disease s/p RAI 2/21, HTN, COPD, chronic LBP, and MDD who presented to the ED per PCP's advise due to concern for thyroid storm.   # Hyperthyroidism:Patient has a history of Grave's disease and is s/p RAI on 2/21. She continues to experience  intermittent chest pain, anxiety and tremulousness despite increase in nevibolol dose. Discussed with patient symptoms will take time to resolved as ablation does not provide immediate resolution of hyperthyroidism. Will continue symptomatic management, but will change therapy to propanolol as below. Will need repeat TSH and FT4 in 4 weeks.  - Start propranolol 10 mg TID  - Methimazole 20 mg QD  - Discontinue Bystolic  - Continue home Xanax and Klonopin as below  - Will continue to monitor symptoms   # HTN:  - Continue home clonidine 0.1 mg QD   # Anxiety:  - Continue home Xanax 0.25 mg QHS PRN. May increase as she is on 1 mg at home.  - Continue home Klonopin 0.5 mg BID PRN  - Continue home clonidine 0.1 mg QD   # Tobacco abuse:Smokes ~0.5 ppd, usually less.  - Nicotine patch 14 mg QD  - Encouraged smoking cessation    Dispo: Anticipated discharge in approximately 1-2 day(s).   Burna CashSantos-Sanchez, Berneita Sanagustin, MD 02/08/2018, 7:26 AM Pager: [phone]

## 2018-02-09 DIAGNOSIS — I10 Essential (primary) hypertension: Secondary | ICD-10-CM | POA: Diagnosis present

## 2018-02-09 DIAGNOSIS — H6123 Impacted cerumen, bilateral: Secondary | ICD-10-CM

## 2018-02-09 DIAGNOSIS — F411 Generalized anxiety disorder: Secondary | ICD-10-CM | POA: Diagnosis present

## 2018-02-09 DIAGNOSIS — Z885 Allergy status to narcotic agent status: Secondary | ICD-10-CM

## 2018-02-09 MED ORDER — METHIMAZOLE 10 MG PO TABS: 20.0000 mg | ORAL_TABLET | Freq: Two times a day (BID) | ORAL | 0 refills | Status: DC

## 2018-02-09 MED ORDER — CLONIDINE HCL 0.1 MG PO TABS: 0.1000 mg | ORAL_TABLET | Freq: Two times a day (BID) | ORAL | 0 refills | Status: DC

## 2018-02-09 MED ORDER — CLONAZEPAM 1 MG PO TABS: 1.0000 mg | ORAL_TABLET | Freq: Two times a day (BID) | ORAL | 0 refills | Status: DC | PRN

## 2018-02-09 MED ORDER — PROPRANOLOL HCL 10 MG PO TABS: 10.0000 mg | ORAL_TABLET | Freq: Three times a day (TID) | ORAL | 0 refills | Status: DC

## 2018-02-09 MED ORDER — CLONIDINE HCL 0.1 MG PO TABS
0.1000 mg | ORAL_TABLET | Freq: Two times a day (BID) | ORAL | Status: DC
  Administered 2018-02-09: 0.1 mg via ORAL
  Filled 2018-02-09: qty 1

## 2018-02-09 NOTE — Care Management Note (Signed)
Case Management Note  Patient Details  Name: Cheyenne FisherDawn Lucero MRN: [id number] Date of Birth: 04-04-1961  Subjective/Objective:     Pt admitted with hyperthyroidism. She is from home alone.               Action/Plan: Pt discharging home with self care. Pt has PCP, insurance and transportation home. CM signing off.   Expected Discharge Date:  02/07/18               Expected Discharge Plan:  Home/Self Care  In-House Referral:     Discharge planning Services     Post Acute Care Choice:    Choice offered to:     DME Arranged:    DME Agency:     HH Arranged:    HH Agency:     Status of Service:  Completed, signed off  If discussed at [employer] Length of Stay Meetings, dates discussed:    Additional Comments:  [name], RN 02/09/2018, 3:42 PM

## 2018-02-09 NOTE — Progress Notes (Signed)
Patient is complaining of right sided neck pain of 5/10.  Patient stated that she received steroid's around admission time that relieved her neck pain and it is getting sore again.  Will continue to monitor.

## 2018-02-09 NOTE — Progress Notes (Signed)
   Subjective:  No acute events overnight.  Remains bradycardic overnight but stable. This morning patient states her symptoms have not improved. States she is aware it will take time for the propanolol to help with symptoms. States Klonopin on longer helping with tremors. Also continues to complain of sore throat and neck pain that extends to her ear. Discussed with patient we expect symptoms to improve with time and to continue to follow up closely with her PCP.   Objective:  Vital signs in last 24 hours: Vitals:   02/08/18 0900 02/08/18 1335 02/08/18 2150 02/09/18 0448  BP: (!) 162/78 (!) 188/86 (!) 180/60 (!) 159/74  Pulse:  (!) 55 (!) 54 (!) 52  Resp:  17 18 16   Temp:  97.7 F (36.5 C) 97.8 F (36.6 C) 98 F (36.7 C)  TempSrc:  Oral Oral Oral  SpO2:  100% 97% 94%  Weight:      Height:       Physical Exam  Constitutional: She is oriented to person, place, and time. She appears well-developed and well-nourished. No distress.  Noticeable tremors of bilateral UE and LE. Patient appears anxious   HENT:  Head: Normocephalic and atraumatic.  Mouth/Throat: No oropharyngeal exudate.  Cerumen impaction on bilateral ear canals   Neck: Normal range of motion. Neck supple. No thyromegaly present.  Cardiovascular: Regular rhythm and normal heart sounds. Exam reveals no gallop and no friction rub.  No murmur heard. Bradycardic   Pulmonary/Chest: Effort normal and breath sounds normal. No respiratory distress. She has no wheezes.  Abdominal: Soft. Bowel sounds are normal. She exhibits no distension. There is no tenderness.  Musculoskeletal: Normal range of motion. She exhibits no edema.  Neurological: She is alert and oriented to person, place, and time.  Skin: Skin is warm and dry.    Assessment/Plan:  Active Problems:   Hyperthyroidism  Cheyenne Lucero is a 57 yo female with history of Grave's disease s/p RAI 2/21, HTN, COPD, chronic LBP, and MDD who presented to the ED per PCP's  advise due to concern for thyroid storm.   # Hyperthyroidism:Patient has a history of Grave's disease and is s/p RAI on 2/21.She continues to experience intermittent chest pain, anxiety and tremulousness despite change to propanolol yesterday. Discussed with patient symptoms will take time to resolve as ablation does not provide immediate resolution of hyperthyroidism. Will continue symptomatic managemen. Will increase clonidine and Klonopin dose as below. Will need repeat TSH and FT4 in 4 weeks. - Continue propranolol 10 mg TID  -Methimazole20mg  QD  - Continue home Xanax and Klonopin as below  - Will continue to monitor symptoms. Advised patient to continue to follow up closely with PCP.   # HTN:  - Increase home clonidine 0.1--> 0.2 mg QD   # Anxiety:  - Continue home Xanax 0.25 mg QHS PRN. May increase as she is on 1 mg at home.  - Increase home Klonopin 0.5 mg --> 1mg  BID PRN  - Increase home clonidine as above   # Tobacco abuse:Smokes ~0.5 ppd, usually less.  - Nicotine patch 14 mg QD  - Encouraged smoking cessation   Dispo: Anticipated discharge today.   Burna CashSantos-Sanchez, Leron Stoffers, MD 02/09/2018, 6:27 AM Pager: [phone]

## 2018-02-09 NOTE — Progress Notes (Signed)
Patient's heart rate decreased while sleeping into the upper 40's, once awakened it was back into the upper 50's.  Patient resting with no complaints.  Will continue to monitor.

## 2018-02-23 ENCOUNTER — Emergency Department (HOSPITAL_COMMUNITY)
Admission: EM | Admit: 2018-02-23 | Discharge: 2018-02-23 | Disposition: A | Discharge status: Home / Self Care | Payer: BLUE CROSS/BLUE SHIELD | Attending: Emergency Medicine | Admitting: Emergency Medicine

## 2018-02-23 ENCOUNTER — Emergency Department (HOSPITAL_COMMUNITY): Payer: BLUE CROSS/BLUE SHIELD

## 2018-02-23 ENCOUNTER — Encounter (HOSPITAL_COMMUNITY): Payer: Self-pay | Admitting: Emergency Medicine

## 2018-02-23 DIAGNOSIS — G44221 Chronic tension-type headache, intractable: Secondary | ICD-10-CM | POA: Diagnosis not present

## 2018-02-23 DIAGNOSIS — R0789 Other chest pain: Secondary | ICD-10-CM | POA: Diagnosis not present

## 2018-02-23 DIAGNOSIS — E0590 Thyrotoxicosis, unspecified without thyrotoxic crisis or storm: Secondary | ICD-10-CM | POA: Diagnosis not present

## 2018-02-23 DIAGNOSIS — F17210 Nicotine dependence, cigarettes, uncomplicated: Secondary | ICD-10-CM | POA: Diagnosis not present

## 2018-02-23 DIAGNOSIS — I2510 Atherosclerotic heart disease of native coronary artery without angina pectoris: Secondary | ICD-10-CM | POA: Insufficient documentation

## 2018-02-23 DIAGNOSIS — J441 Chronic obstructive pulmonary disease with (acute) exacerbation: Secondary | ICD-10-CM | POA: Insufficient documentation

## 2018-02-23 DIAGNOSIS — R5383 Other fatigue: Secondary | ICD-10-CM | POA: Diagnosis present

## 2018-02-23 DIAGNOSIS — Z79899 Other long term (current) drug therapy: Secondary | ICD-10-CM | POA: Insufficient documentation

## 2018-02-23 DIAGNOSIS — R51 Headache: Secondary | ICD-10-CM

## 2018-02-23 DIAGNOSIS — I119 Hypertensive heart disease without heart failure: Secondary | ICD-10-CM | POA: Insufficient documentation

## 2018-02-23 DIAGNOSIS — G8929 Other chronic pain: Secondary | ICD-10-CM

## 2018-02-23 LAB — I-STAT BETA HCG BLOOD, ED (MC, WL, AP ONLY): I-stat hCG, quantitative: 11.2 m[IU]/mL — ABNORMAL HIGH (ref <5)

## 2018-02-23 LAB — BASIC METABOLIC PANEL
ANION GAP: 10 (ref 5–15)
BUN: 8 mg/dL (ref 6–20)
CALCIUM: 9.5 mg/dL (ref 8.9–10.3)
CO2: 22 mmol/L (ref 22–32)
CREATININE: 0.52 mg/dL (ref 0.44–1.00)
Chloride: 109 mmol/L (ref 101–111)
GFR calc Af Amer: >60 mL/min (ref >60)
Glucose, Bld: 85 mg/dL (ref 65–99)
Potassium: 3.9 mmol/L (ref 3.5–5.1)
Sodium: 141 mmol/L (ref 135–145)

## 2018-02-23 LAB — CBC
HCT: 42.6 % (ref 36.0–46.0)
Hemoglobin: 14.9 g/dL (ref 12.0–15.0)
MCH: 31.4 pg (ref 26.0–34.0)
MCHC: 35 g/dL (ref 30.0–36.0)
MCV: 89.9 fL (ref 78.0–100.0)
PLATELETS: 184 10*3/uL (ref 150–400)
RBC: 4.74 MIL/uL (ref 3.87–5.11)
RDW: 13.1 % (ref 11.5–15.5)
WBC: 12.3 10*3/uL — AB (ref 4.0–10.5)

## 2018-02-23 LAB — D-DIMER, QUANTITATIVE: D-Dimer, Quant: 0.28 ug/mL-FEU (ref 0.00–0.50)

## 2018-02-23 LAB — I-STAT TROPONIN, ED
Troponin i, poc: 0 ng/mL (ref 0.00–0.08)
Troponin i, poc: 0 ng/mL (ref 0.00–0.08)

## 2018-02-23 LAB — MAGNESIUM: MAGNESIUM: 1.9 mg/dL (ref 1.7–2.4)

## 2018-02-23 LAB — T4, FREE: Free T4: 3.3 ng/dL — ABNORMAL HIGH (ref 0.61–1.12)

## 2018-02-23 MED ORDER — CLONIDINE HCL 0.1 MG PO TABS
0.1000 mg | ORAL_TABLET | Freq: Once | ORAL | Status: AC
  Administered 2018-02-23: 0.1 mg via ORAL
  Filled 2018-02-23: qty 1

## 2018-02-23 MED ORDER — ALBUTEROL SULFATE HFA 108 (90 BASE) MCG/ACT IN AERS
2.0000 | INHALATION_SPRAY | Freq: Once | RESPIRATORY_TRACT | Status: AC
  Administered 2018-02-23: 2 via RESPIRATORY_TRACT
  Filled 2018-02-23: qty 6.7

## 2018-02-23 MED ORDER — SODIUM CHLORIDE 0.9 % IV BOLUS (SEPSIS)
1000.0000 mL | Freq: Once | INTRAVENOUS | Status: AC
  Administered 2018-02-23: 1000 mL via INTRAVENOUS

## 2018-02-23 MED ORDER — ASPIRIN 81 MG PO CHEW: 324.0000 mg | CHEWABLE_TABLET | Freq: Once | ORAL | Status: DC

## 2018-02-23 MED ORDER — KETOROLAC TROMETHAMINE 30 MG/ML IJ SOLN
30.0000 mg | Freq: Once | INTRAMUSCULAR | Status: AC
  Administered 2018-02-23: 30 mg via INTRAVENOUS
  Filled 2018-02-23: qty 1

## 2018-02-23 MED ORDER — DOXYCYCLINE HYCLATE 100 MG PO CAPS: 100.0000 mg | ORAL_CAPSULE | Freq: Two times a day (BID) | ORAL | 0 refills | Status: DC

## 2018-02-23 MED ORDER — DIPHENHYDRAMINE HCL 50 MG/ML IJ SOLN
25.0000 mg | Freq: Once | INTRAMUSCULAR | Status: AC
  Administered 2018-02-23: 25 mg via INTRAVENOUS
  Filled 2018-02-23: qty 1

## 2018-02-23 MED ORDER — PROCHLORPERAZINE EDISYLATE 5 MG/ML IJ SOLN
10.0000 mg | Freq: Once | INTRAMUSCULAR | Status: AC
  Administered 2018-02-23: 10 mg via INTRAVENOUS
  Filled 2018-02-23: qty 2

## 2018-02-23 MED ORDER — METOCLOPRAMIDE HCL 5 MG/ML IJ SOLN
10.0000 mg | Freq: Once | INTRAMUSCULAR | Status: AC
  Administered 2018-02-23: 10 mg via INTRAVENOUS
  Filled 2018-02-23: qty 2

## 2018-02-23 NOTE — Discharge Instructions (Addendum)
We discussed your symptoms including headache, fatigue, diarrhea, chest pain, shortness of breath. Today we ruled out emergent causes of chest pain. Your chest x-ray, EKG, heart enzymes were ok today. Given your recent cough with phlegm, we will treat you with doxycyline.   Call Dr Hal Hopeichter in 24 hours to check in. You need endocrinology follow up.

## 2018-02-23 NOTE — ED Notes (Signed)
Dr. Jeraldine LootsLockwood explained plan of care to pt.

## 2018-02-23 NOTE — ED Triage Notes (Signed)
Pt was at dr office called out for CP been going on for 3 weeks, increased in last 3 days, intermittent, dull center of chest to jaw, N, diarrhea, SOB and dizziness as well. 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 Pt was changed to propanolol 3 weeks ago. Given 324 mg aspirin, 4/10 pain, 1 nitro now 0/10 pain, 4mg  zofran

## 2018-02-23 NOTE — ED Provider Notes (Signed)
[facility]   CSN: [id number] Arrival date & time: 02/23/18  1742     History   Chief Complaint No chief complaint on file.   HPI Cheyenne Lucero is a 57 y.o. female with history of Graves' disease status post ablation on 2/21, hypertension, COPD, chronic low back pain, depression and anxiety is sent to the ED by PCP (Dr.Richter) for evaluation of persistent symptoms related to hyperthyroidism. These include generalized fatigue, weight loss, headaches, chest pain, jitteriness, fluctuating HTN, intermittent blurred vision.  Pt states all these symptoms started and worsened after ablation on 2/21.  Specifically, chest pain, shortness of breath, cough, headache worsened in last 2-3 days. Chest discomfort is described as moderate, intermittent, no precipitation factors and occasionally occurs while at reset and randomly.  Exertion or deep inspirations do not affect it. CP is central and radiates to her lateral rib cage, jaw, teeth acutely worsening over the last 4 days. Associated symptoms include worsening cough with sputum 2 days, worsening shortness of breath 2 days.  History of PE 15-20 years ago. Reports over 3-4 days she has developed posterior bilateral calf pain worse with palpation, no leg swelling.  Diarrhea today after every meal 3. Patient has been compliant with clonidine, methimazole, propranolol. Does take Klonopin when necessary for anxiety and Xanax daily at bedtime. 10 cigarettes daily.   HPI  Past Medical History:  Diagnosis Date  . COPD (chronic obstructive pulmonary disease) (HCC)   . Coronary artery disease 2004   heart event from a blood clot   . Pulmonary embolus [facility](HCC)     Patient Active Problem List   Diagnosis Date Noted  . Anxiety state 02/09/2018  . Essential hypertension 02/09/2018  . Hypothyroidism 02/06/2018  . Hyperthyroidism 02/06/2018  . Lumbar radiculopathy 11/19/2017  . Other idiopathic scoliosis,  thoracolumbar region 11/19/2017    Past Surgical History:  Procedure Laterality Date  . LEFT HEART CATH      OB History    No data available       Home Medications    Prior to Admission medications   Medication Sig Start Date End Date Taking? Authorizing Provider  acetaminophen (TYLENOL) 325 MG tablet Take 650 mg by mouth every 6 (six) hours as needed for mild pain.   Yes [provider]  ALPRAZolam Prudy Feeler(XANAX) 0.5 MG tablet Take 0.5 mg by mouth at bedtime.   Yes [provider]  amoxicillin (AMOXIL) 500 MG tablet Take 500 mg by mouth 3 (three) times daily. 02/16/18  Yes [provider]  Cholecalciferol (VITAMIN D3) 2000 units TABS Take 1 tablet by mouth daily.   Yes [provider]  clonazePAM (KLONOPIN) 1 MG tablet Take 1 tablet (1 mg total) by mouth 2 (two) times daily as needed for anxiety. 02/09/18  Yes Santos-Sanchez, Chelsea PrimusIdalys, MD  cloNIDine (CATAPRES) 0.1 MG tablet Take 1 tablet (0.1 mg total) by mouth 2 (two) times daily. 02/09/18  Yes Santos-Sanchez, Chelsea PrimusIdalys, MD  methimazole (TAPAZOLE) 10 MG tablet Take 2 tablets (20 mg total) by mouth 2 (two) times daily. 02/09/18  Yes Santos-Sanchez, Chelsea PrimusIdalys, MD  propranolol (INDERAL) 10 MG tablet Take 1 tablet (10 mg total) by mouth 3 (three) times daily. 02/09/18  Yes Santos-Sanchez, Chelsea PrimusIdalys, MD  doxycycline (VIBRAMYCIN) 100 MG capsule Take 1 capsule (100 mg total) by mouth 2 (two) times daily. 02/23/18   Liberty HandyGibbons, Mashelle Busick J, PA-C    Family History Family History  Problem Relation Age of Onset  . Lupus Mother  Social History Social History   Tobacco Use  . Smoking status: Current Every Day Smoker    Packs/day: 0.45    Types: Cigarettes  . Smokeless tobacco: Never Used  . Tobacco comment: Pt states she is ready to quit smoking  Substance Use Topics  . Alcohol use: No  . Drug use: No     Allergies   Morphine and related and Tramadol   Review of Systems Review of Systems  HENT: Positive for sore  throat.   Respiratory: Positive for cough and shortness of breath.   Cardiovascular: Positive for chest pain.       Calf pain bilateral  Gastrointestinal: Positive for diarrhea.  Musculoskeletal: Positive for neck pain.  Neurological: Positive for headaches.  All other systems reviewed and are negative.    Physical Exam Updated Vital Signs BP (!) 144/113   Pulse 78   Temp 98.7 F (37.1 C) (Oral)   Resp 18   Ht 5\' 4"  (1.626 m)   Wt 50.8 kg (112 lb)   SpO2 93%   BMI 19.22 kg/m   Physical Exam  Constitutional: She is oriented to person, place, and time. She appears well-developed and well-nourished. No distress.  Non toxic  HENT:  Head: Normocephalic and atraumatic.  Nose: Nose normal.  Mouth/Throat: No oropharyngeal exudate.  Dry lips and mucous membranes.   Eyes: Conjunctivae and EOM are normal. Pupils are equal, round, and reactive to light.  Neck: Normal range of motion.  Cardiovascular: Normal rate, regular rhythm and intact distal pulses.  No murmur heard. Bilateral calf tenderness without edema or skin changes. 2+ DP and radial pulses bilaterally.   Pulmonary/Chest: Effort normal and breath sounds normal. No respiratory distress. She has no wheezes. She has no rales.  Abdominal: Soft. Bowel sounds are normal. There is no tenderness.  No G/R/R. No suprapubic or CVA tenderness.   Musculoskeletal: Normal range of motion. She exhibits no deformity.  Neurological: She is alert and oriented to person, place, and time.  Skin: Skin is warm and dry. Capillary refill takes less than 2 seconds.  Psychiatric: She has a normal mood and affect. Her behavior is normal. Judgment and thought content normal.  Nursing note and vitals reviewed.    ED Treatments / Results  Labs (all labs ordered are listed, but only abnormal results are displayed) Labs Reviewed  CBC - Abnormal; Notable for the following components:      Result Value   WBC 12.3 (*)    All other components within  normal limits  T4, FREE - Abnormal; Notable for the following components:   Free T4 3.30 (*)    All other components within normal limits  I-STAT BETA HCG BLOOD, ED ([facility], [facility], [facility] ONLY) - Abnormal; Notable for the following components:   I-stat hCG, quantitative 11.2 (*)    All other components within normal limits  BASIC METABOLIC PANEL  MAGNESIUM  D-DIMER, QUANTITATIVE (NOT AT [facility])  TSH  I-STAT TROPONIN, ED  I-STAT TROPONIN, ED  I-STAT TROPONIN, ED  CBG MONITORING, ED    EKG  EKG Interpretation  Date/Time:  Tuesday February 23 2018 18:09:59 EDT Ventricular Rate:  72 PR Interval:    QRS Duration: 96 QT Interval:  427 QTC Calculation: 468 R Axis:   80 Text Interpretation:  Sinus rhythm Probable left ventricular hypertrophy Baseline wander in lead(s) II aVF Artifact Abnormal ekg Confirmed by Gerhard Munch [phone]) on 02/23/2018 7:20:44 PM       Radiology Dg Chest 2 View  Result Date: 02/23/2018 CLINICAL DATA:  Chest pain EXAM: CHEST - 2 VIEW COMPARISON:  09/04/2017 FINDINGS: Scoliosis of the spine. No acute consolidation or pleural effusion. Minimal atelectasis at the lingula. Stable cardiomediastinal silhouette. No pneumothorax. IMPRESSION: No active cardiopulmonary disease. Minimal atelectasis at the lingula. Electronically Signed   By: Jasmine Pang M.D.   On: 02/23/2018 20:14    Procedures Procedures (including critical care time)  Medications Ordered in ED Medications  aspirin chewable tablet 324 mg (324 mg Oral Not Given 02/23/18 1910)  cloNIDine (CATAPRES) tablet 0.1 mg (not administered)  metoCLOPramide (REGLAN) injection 10 mg (10 mg Intravenous Given 02/23/18 2204)  sodium chloride 0.9 % bolus 1,000 mL (1,000 mLs Intravenous New Bag/Given 02/23/18 2200)  prochlorperazine (COMPAZINE) injection 10 mg (10 mg Intravenous Given 02/23/18 2310)  diphenhydrAMINE (BENADRYL) injection 25 mg (25 mg Intravenous Given 02/23/18 2310)  ketorolac (TORADOL) 30 MG/ML injection 30 mg (30  mg Intravenous Given 02/23/18 2310)     Initial Impression / Assessment and Plan / ED Course  I have reviewed the triage vital signs and the nursing notes.  Pertinent labs & imaging results that were available during my care of the patient were reviewed by me and considered in my medical decision making (see chart for details).  Clinical Course as of Feb 24 2336  Tue Feb 23, 2018  2042 T4,Free(Direct): (!) 3.30 [CG]  2042 WBC: (!) 12.3 [CG]  2311 Malignant htn weak 0.01 TSH clonidine 0.1 yest 240/120s, bystolic.   [CG]    Clinical Course User Index [CG] Liberty Handy, PA-C   57 yo female with CP.  Intermittent.  Non pleuritic non exertional. Associated with SOB, cough, sputum in setting of tobacco abuse and COPD. CP pertinent risk factors include HTN, smoking.  Ho PE.  On exam VS are wnl. CXR, EKG, troponin x2 WNL.  CBC and BMP unremarkable. D-dimer negative.  EKG with borderline LVH.  Pt complaining of headache and given migraine cocktail. HEART score = 3. Given symptoms, reassuring ED work up, low risk HEART score will d/c with cardiology in regards to today's hospital visit and chest pain.  Given productive cough in setting of COPD will d/c with doxycyline.   2330: Pt and family requested to speak to me. Frustrated because they were under the impression they would see an endocrinologist and radiologist today and get admitted. Dr. Hal Hope called and we spoke regarding pt ED work up. She was concerned about fluctuating HTN with readings as high as 240/120 yesterday despite antihypertensives and improvement in TSH/T3 levels. Pt's HR and BP have been acceptable in ED ranging from 107-180/64-125, last 144/113. No tachycardia. Other than 3 episodes of diarrhea today, pt has no diagnostic criteria for thyroid storm, score = 10 due to diarrhea but has no fever, agitation, delirium, psychosis, seizure, abdominal pain, tachycardia, signs of HF. Complete work up today shared with Dr Hal Hope via  phone, I will also send her a message privately. Discussed CT head for ongoing HA not indicated, and DR Hal Hope agreed. Pt had complete resolution of HA after second migraine cocktail. Had lengthy discussion with pt and family at bedside. They are agreeable with discharge at this time and f/u with Dr Hal Hope in 24 hrs and endocrinology urgentntly. Discussed return precautions.  Final Clinical Impressions(s) / ED Diagnoses   Final diagnoses:  Hyperthyroidism  Atypical chest pain  Chronic nonintractable headache, unspecified headache type  COPD exacerbation [facility])    ED Discharge Orders  Ordered    doxycycline (VIBRAMYCIN) 100 MG capsule  2 times daily     02/23/18 2326       Jerrell Mylar 02/23/18 2337    Gerhard Munch, MD 02/25/18 [phone]

## 2018-02-23 NOTE — ED Notes (Signed)
Cheyenne Lucero. Gibbons PA at bedside speaking with pt. and family on plan of care .

## 2018-03-04 ENCOUNTER — Other Ambulatory Visit (HOSPITAL_COMMUNITY): Payer: Self-pay | Admitting: Family Medicine

## 2018-03-04 DIAGNOSIS — I10 Essential (primary) hypertension: Secondary | ICD-10-CM

## 2018-03-09 ENCOUNTER — Telehealth: Payer: Self-pay

## 2018-03-09 NOTE — Telephone Encounter (Signed)
SENT REFERRAL TO SCHEDULING 

## 2018-03-12 ENCOUNTER — Ambulatory Visit (HOSPITAL_COMMUNITY)
Admission: RE | Admit: 2018-03-12 | Discharge: 2018-03-12 | Disposition: A | Discharge status: Home / Self Care | Payer: BLUE CROSS/BLUE SHIELD | Source: Ambulatory Visit | Attending: Cardiovascular Disease | Admitting: Cardiovascular Disease

## 2018-03-12 DIAGNOSIS — I10 Essential (primary) hypertension: Secondary | ICD-10-CM | POA: Diagnosis present

## 2018-03-12 DIAGNOSIS — F17200 Nicotine dependence, unspecified, uncomplicated: Secondary | ICD-10-CM | POA: Diagnosis not present

## 2018-04-06 ENCOUNTER — Other Ambulatory Visit: Payer: Self-pay | Admitting: Ophthalmology

## 2018-04-06 DIAGNOSIS — H5340 Unspecified visual field defects: Secondary | ICD-10-CM

## 2018-04-13 ENCOUNTER — Ambulatory Visit
Admission: RE | Admit: 2018-04-13 | Discharge: 2018-04-13 | Disposition: A | Discharge status: Home / Self Care | Payer: BLUE CROSS/BLUE SHIELD | Source: Ambulatory Visit | Attending: Ophthalmology | Admitting: Ophthalmology

## 2018-04-13 DIAGNOSIS — H5340 Unspecified visual field defects: Secondary | ICD-10-CM

## 2018-04-13 MED ORDER — IOPAMIDOL (ISOVUE-300) INJECTION 61%
75.0000 mL | Freq: Once | INTRAVENOUS | Status: AC | PRN
  Administered 2018-04-13: 75 mL via INTRAVENOUS

## 2018-06-27 IMAGING — CR DG CHEST 2V
2 series · 2 of 2 positions shown · non-contrast
Comparison: 09/04/2017

CLINICAL DATA: Chest pain

EXAM:
CHEST - 2 VIEW

[chest lat]
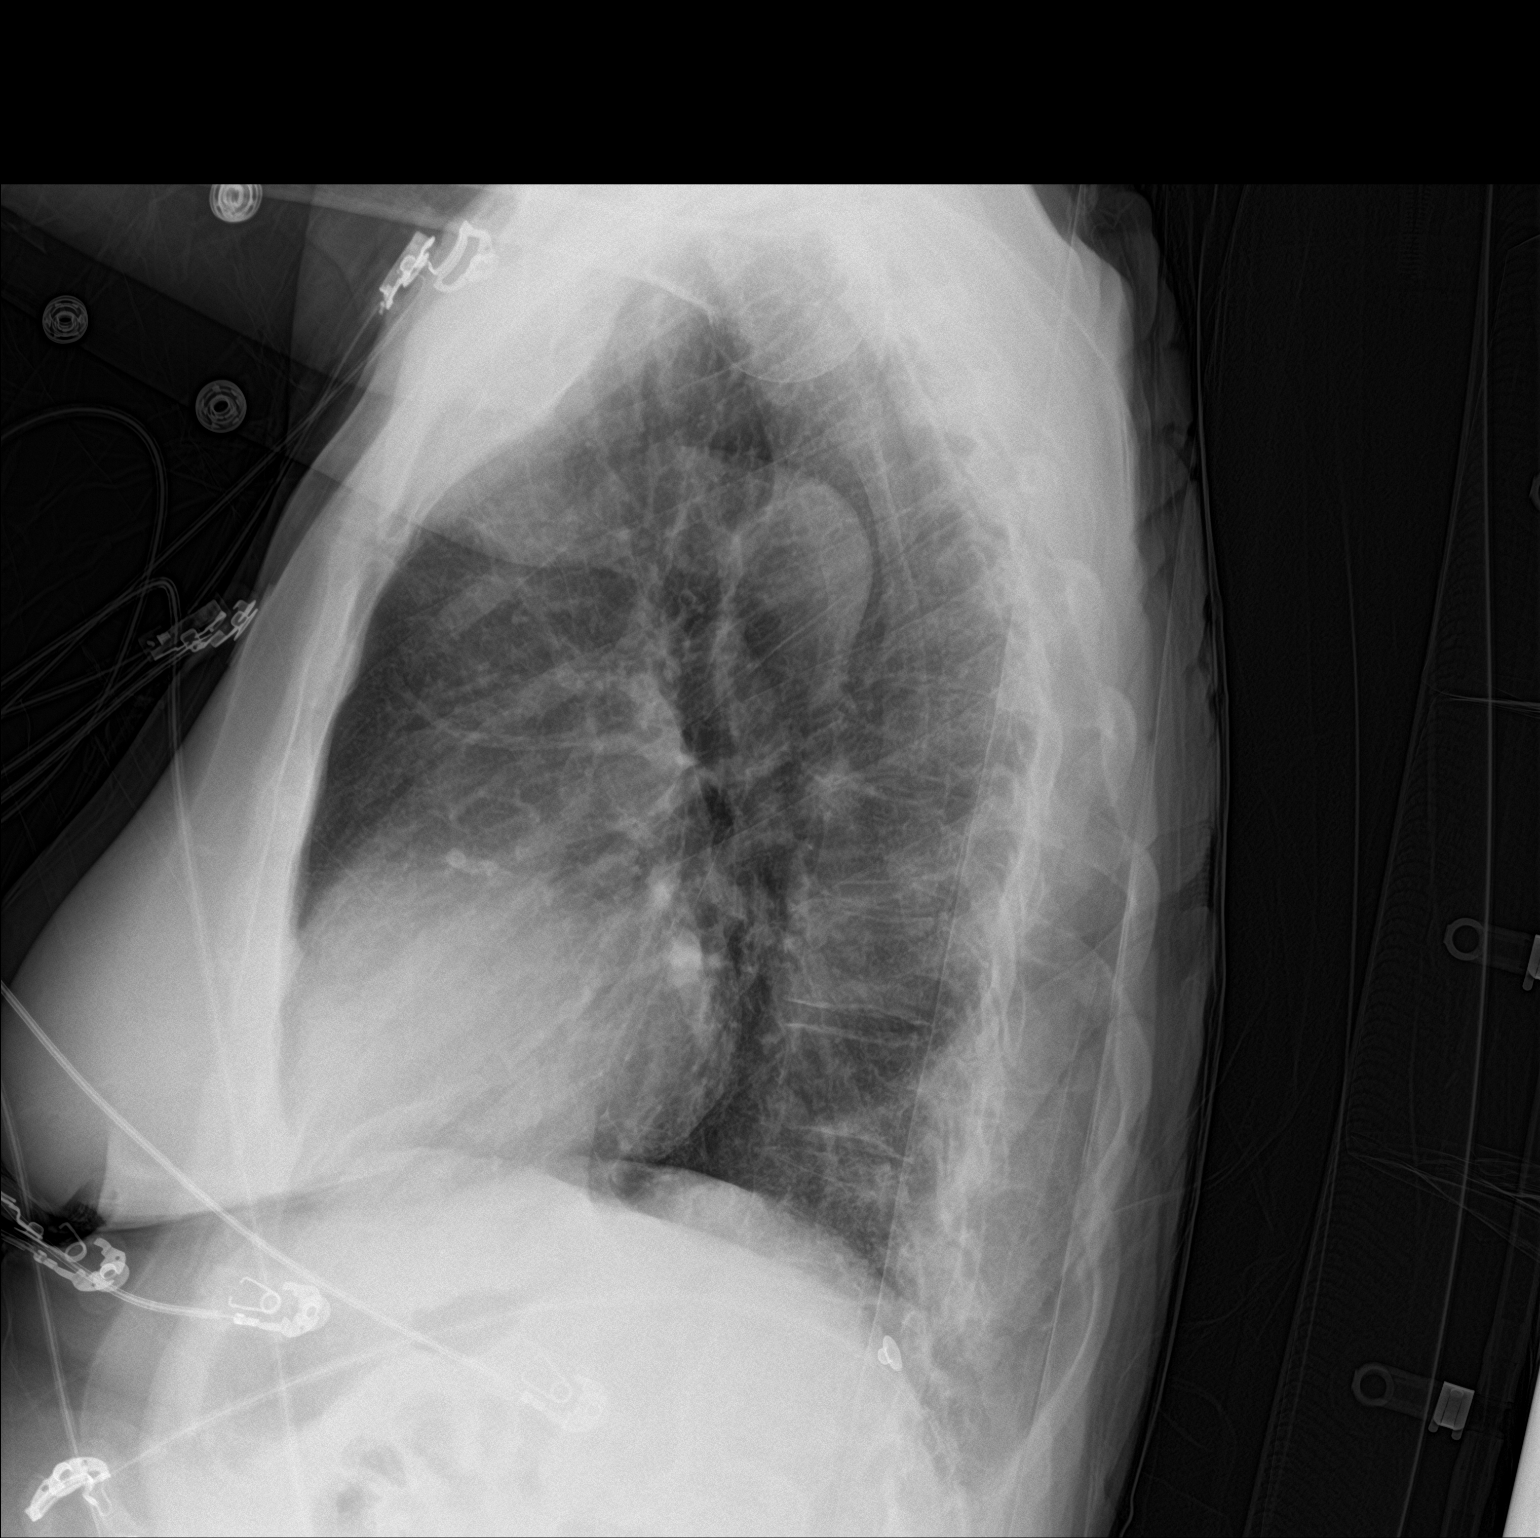

[chest ap]
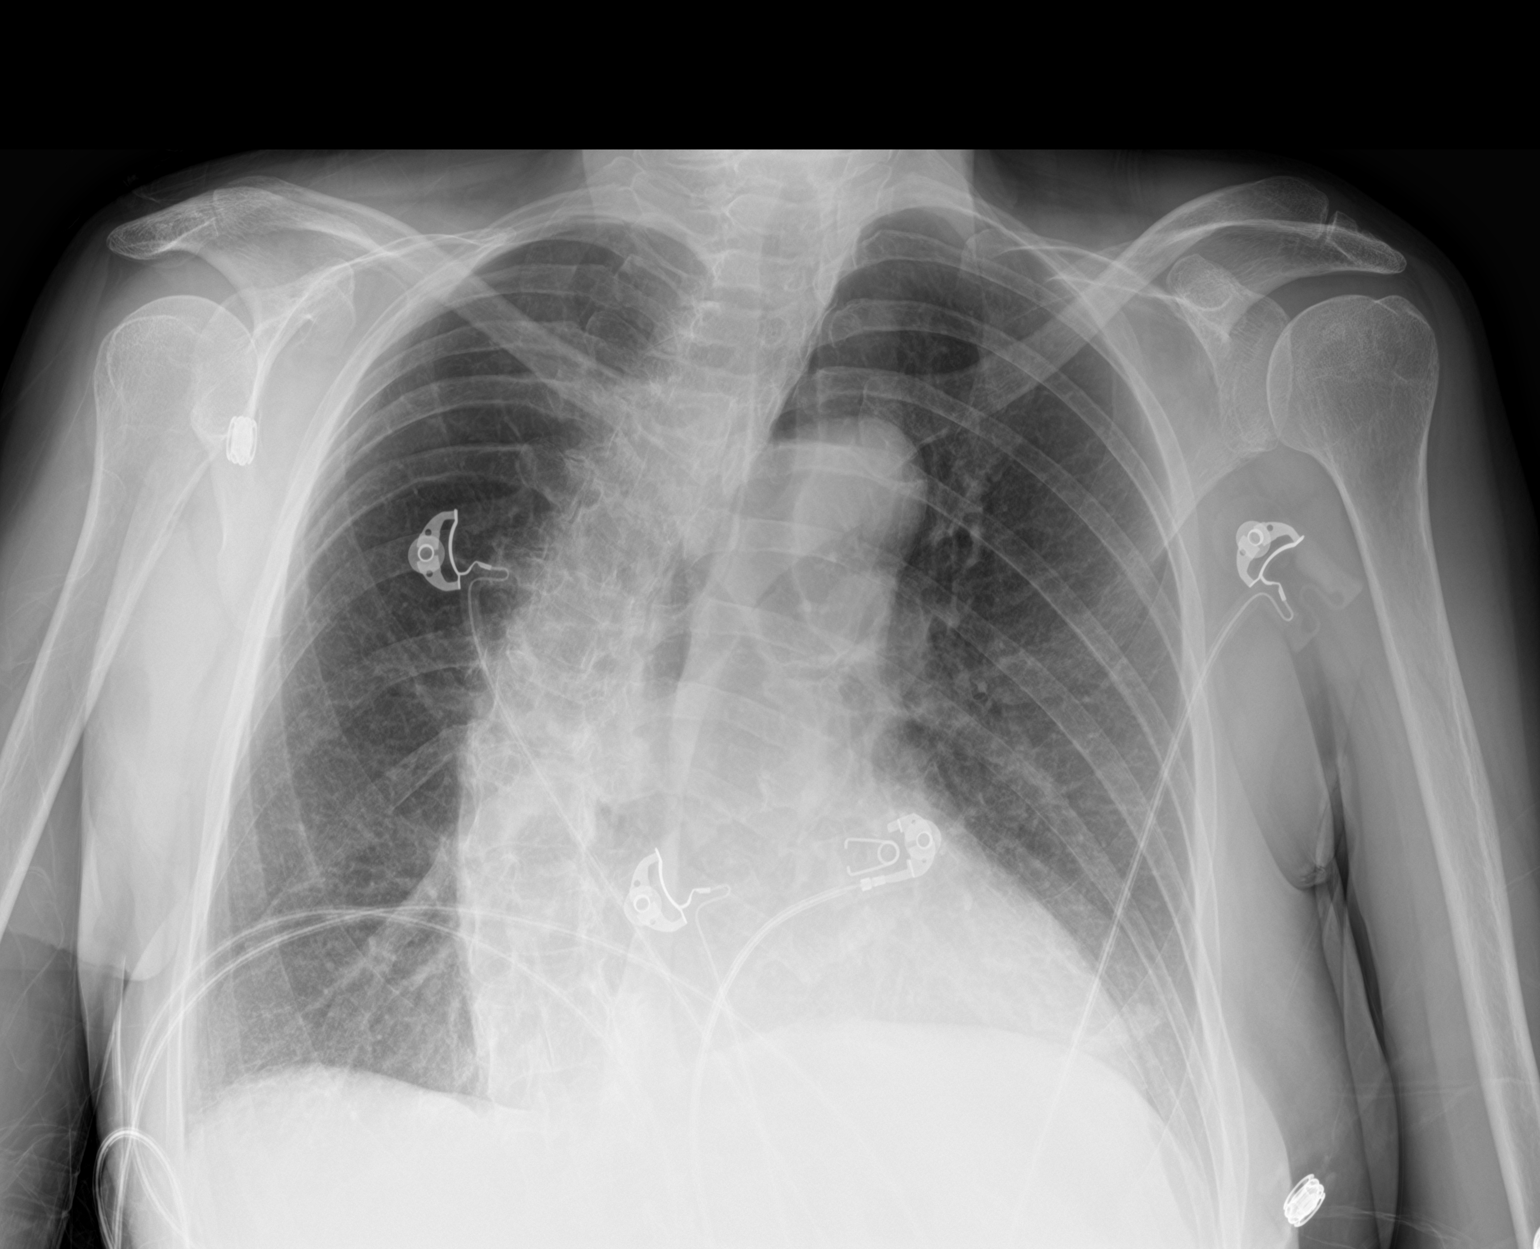

[2 of 2 positions shown; findings below may reference images not displayed]

FINDINGS: Scoliosis of the spine. No acute consolidation or pleural effusion.
Minimal atelectasis at the lingula. Stable cardiomediastinal
silhouette. No pneumothorax.
IMPRESSION: No active cardiopulmonary disease. Minimal atelectasis at the
lingula.

## 2018-08-15 IMAGING — CT CT ORBITS W/ CM
3 of 4 series · 15 of 47 positions shown, 18 images · IV contrast (iopamidol)
Comparison: Head CT 09/08/2017

CLINICAL DATA: Visual field defect. History of Graves disease and
headaches.

EXAM:
CT ORBITS WITH CONTRAST
TECHNIQUE: Multidetector CT images was performed according to the standard
protocol following intravenous contrast administration.
CONTRAST:  75mL KGTW7Y-PEE IOPAMIDOL (KGTW7Y-PEE) INJECTION 61%

[Series 3: orbits 2.00 hr36 s3 orbits soft · axial · 0.31mm/px · z∈[-512,-410]mm · 10 of 61 slices shown, 13 images]
[im 5/61  brain]
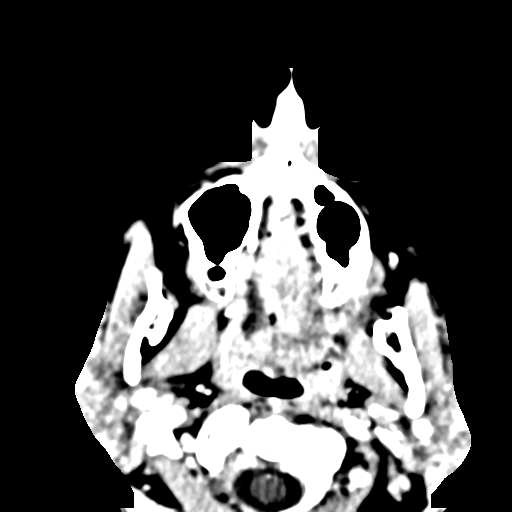
[im 5/61  bone]
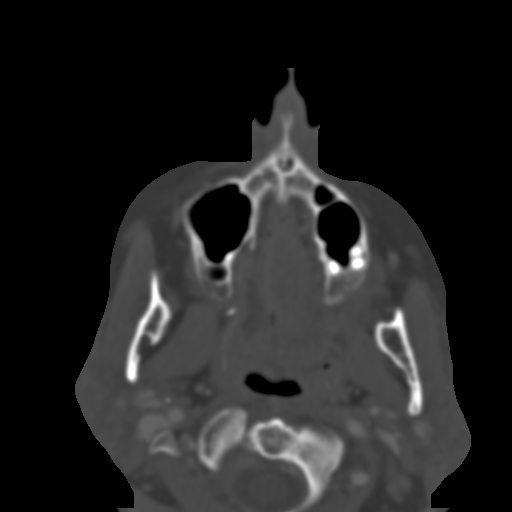
[im 11/61  bone]
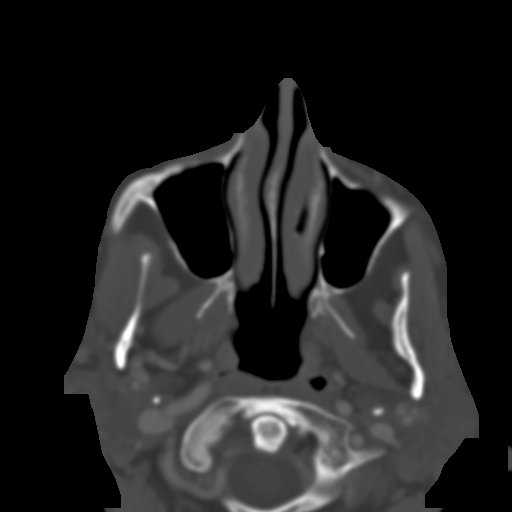
[im 17/61  bone]
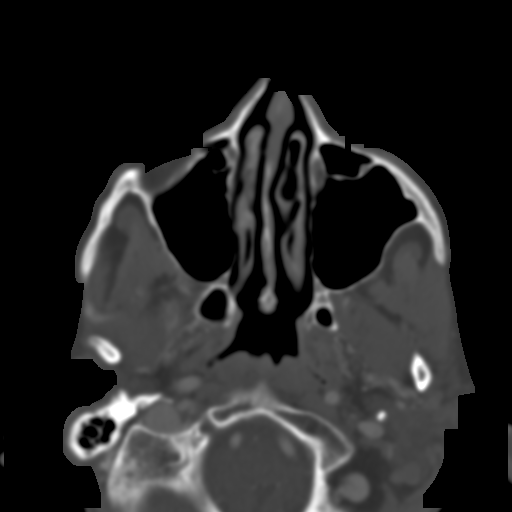
[im 21/61  bone]
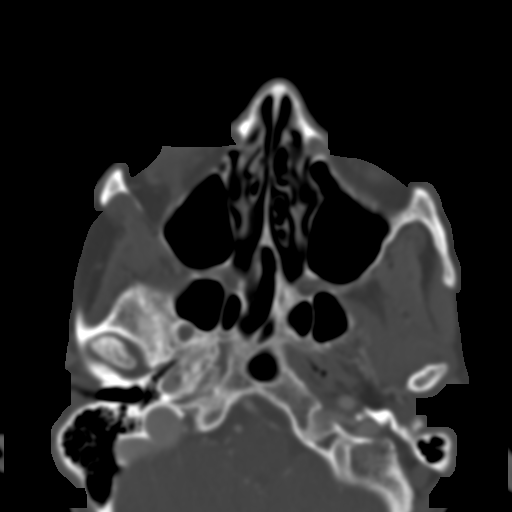
[im 27/61  brain]
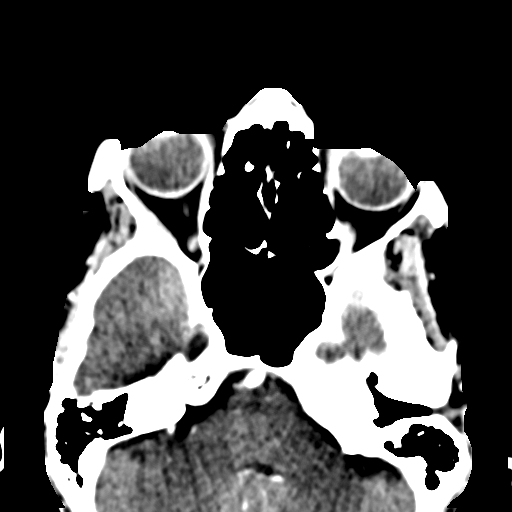
[im 27/61  bone]
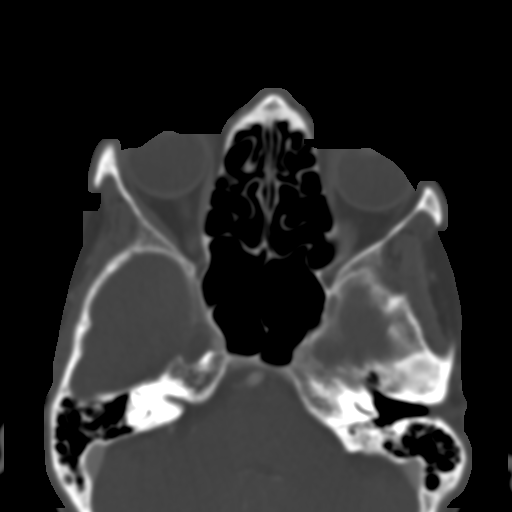
[im 34/61  bone]
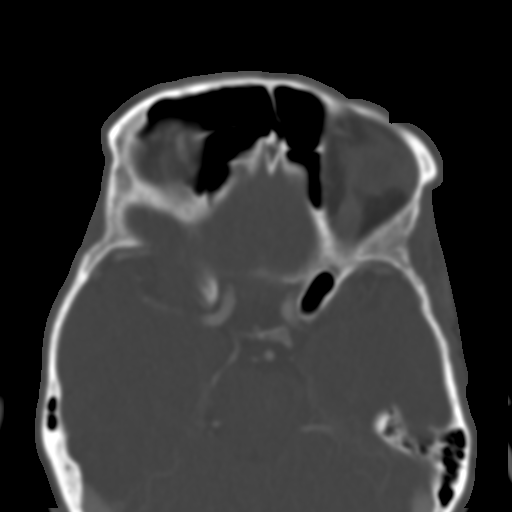
[im 40/61  bone]
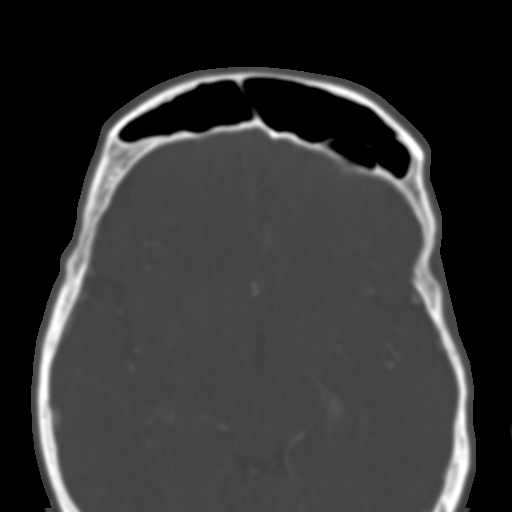
[im 46/61  bone]
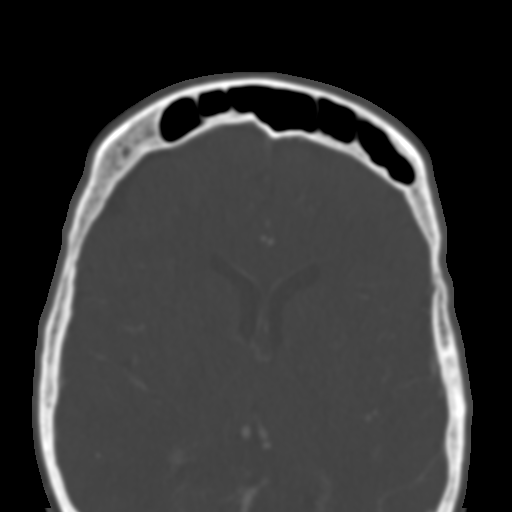
[im 50/61  brain]
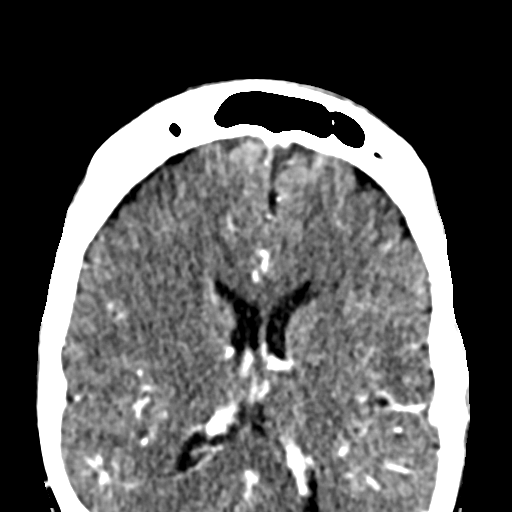
[im 50/61  bone]
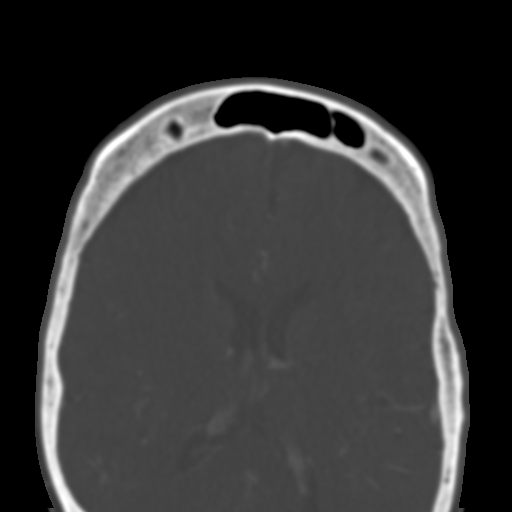
[im 56/61  bone]
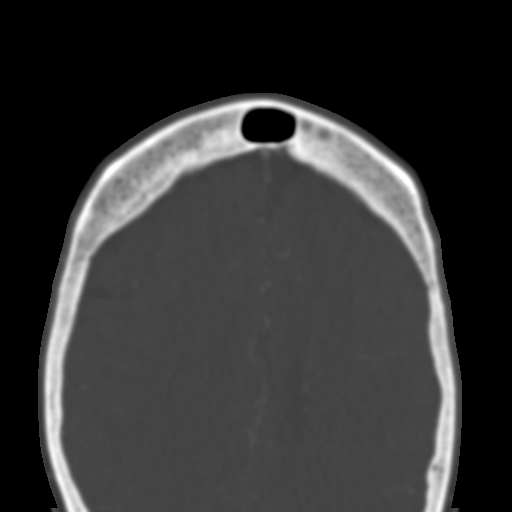

[Series 4: orbits 2.00 br36 s3 cor cor soft · coronal · 0.24mm/px · 3 of 72 slices shown]
[im 24/72  bone]
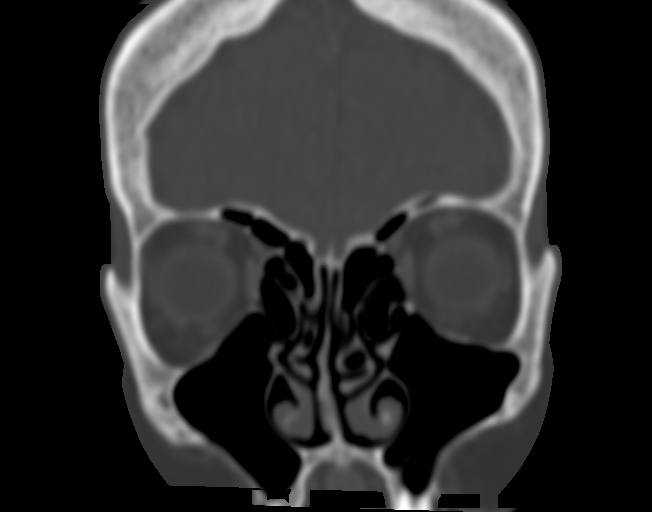
[im 32/72  bone]
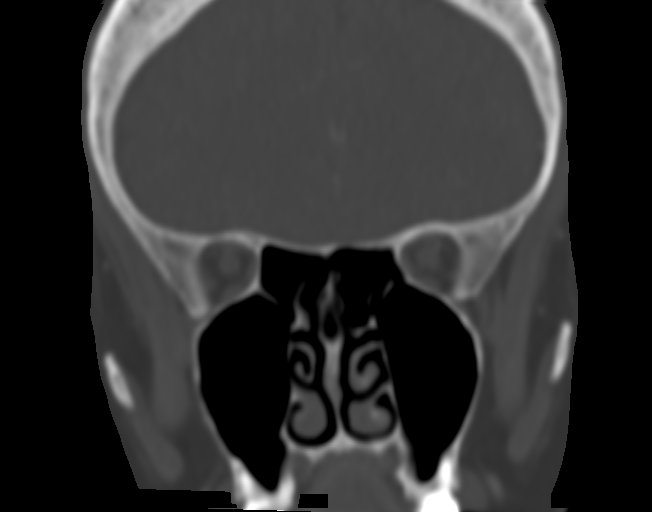
[im 40/72  bone]
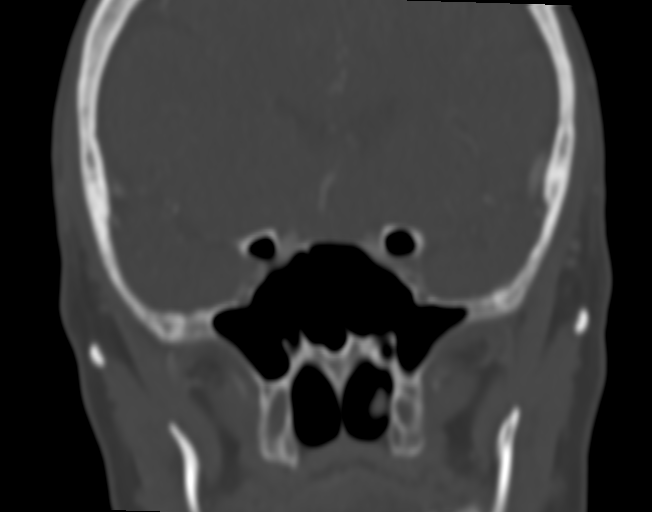

[Series 10: orbits 2.00 hr60 s3 sag sag soft · sagittal · 0.24mm/px · 2 of 78 slices shown]
[im 26/78  bone]
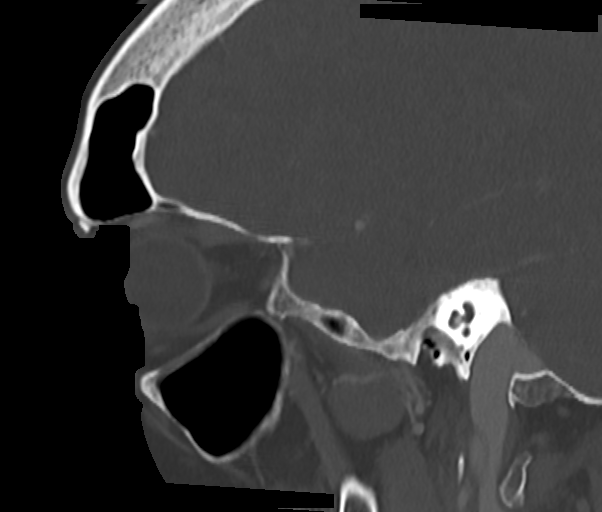
[im 52/78  bone]
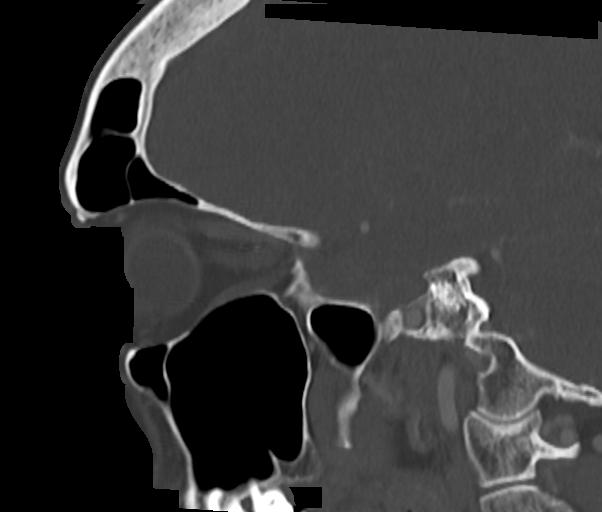

[15 of 47 positions shown; findings below may reference images not displayed]

FINDINGS: Orbits: No extraocular muscle thickening or proptosis for [place]
ophthalmopathy. Normal appearance of the globes, lacrimal glands,
orbital fat and optic nerve sheath complexes. Symmetric superior
ophthalmic veins.

Visualized sinuses: Clear

Soft tissues: Negative

Limited intracranial: Negative
IMPRESSION: No explanation for visual field defect. No evidence of [place]
ophthalmopathy.

## 2018-08-23 ENCOUNTER — Other Ambulatory Visit: Payer: Self-pay | Admitting: Family Medicine

## 2018-08-23 DIAGNOSIS — E2839 Other primary ovarian failure: Secondary | ICD-10-CM

## 2018-08-23 DIAGNOSIS — Z1231 Encounter for screening mammogram for malignant neoplasm of breast: Secondary | ICD-10-CM

## 2018-09-14 ENCOUNTER — Other Ambulatory Visit: Payer: Self-pay | Admitting: Internal Medicine

## 2018-10-19 ENCOUNTER — Ambulatory Visit
Admission: RE | Admit: 2018-10-19 | Discharge: 2018-10-19 | Disposition: A | Discharge status: Home / Self Care | Payer: BLUE CROSS/BLUE SHIELD | Source: Ambulatory Visit | Attending: Family Medicine | Admitting: Family Medicine

## 2018-10-19 DIAGNOSIS — Z1231 Encounter for screening mammogram for malignant neoplasm of breast: Secondary | ICD-10-CM

## 2018-10-19 DIAGNOSIS — E2839 Other primary ovarian failure: Secondary | ICD-10-CM

## 2018-11-22 ENCOUNTER — Other Ambulatory Visit: Payer: Self-pay | Admitting: Family Medicine

## 2018-11-23 ENCOUNTER — Other Ambulatory Visit: Payer: Self-pay | Admitting: Family Medicine

## 2018-11-23 DIAGNOSIS — M545 Low back pain, unspecified: Secondary | ICD-10-CM

## 2018-11-29 ENCOUNTER — Other Ambulatory Visit: Payer: BLUE CROSS/BLUE SHIELD

## 2019-08-29 ENCOUNTER — Other Ambulatory Visit (HOSPITAL_COMMUNITY): Payer: Self-pay | Admitting: Family Medicine

## 2019-08-29 DIAGNOSIS — R0789 Other chest pain: Secondary | ICD-10-CM

## 2019-09-02 ENCOUNTER — Ambulatory Visit (HOSPITAL_COMMUNITY): Payer: BC Managed Care – PPO | Attending: Cardiology

## 2019-09-02 ENCOUNTER — Other Ambulatory Visit: Payer: Self-pay

## 2019-09-02 DIAGNOSIS — R0789 Other chest pain: Secondary | ICD-10-CM | POA: Diagnosis not present

## 2020-03-09 ENCOUNTER — Ambulatory Visit: Payer: BC Managed Care – PPO | Attending: Internal Medicine

## 2020-03-09 DIAGNOSIS — Z23 Encounter for immunization: Secondary | ICD-10-CM

## 2020-03-09 NOTE — Progress Notes (Signed)
   Covid-19 Vaccination Clinic  Name:  Cheyenne Lucero    MRN: [id number] DOB: Mar 17, 1961  03/09/2020  [name] was observed post Covid-19 immunization for 15 minutes without incident. She was provided with Vaccine Information Sheet and instruction to access the V-Safe system.   [name] was instructed to call 911 with any severe reactions post vaccine: Marland Kitchen Difficulty breathing  . Swelling of face and throat  . A fast heartbeat  . A bad rash all over body  . Dizziness and weakness   Immunizations Administered    Name Date Dose VIS Date Route   Pfizer COVID-19 Vaccine 03/09/2020 12:58 PM 0.3 mL 11/18/2019 Intramuscular   Manufacturer: [employer], [employer]   Lot: [id number]   NDC: [address]-1

## 2020-04-02 ENCOUNTER — Ambulatory Visit: Payer: BC Managed Care – PPO | Attending: Internal Medicine

## 2020-04-02 DIAGNOSIS — Z23 Encounter for immunization: Secondary | ICD-10-CM

## 2020-04-02 NOTE — Progress Notes (Signed)
   Covid-19 Vaccination Clinic  Name:  Sahar Ryback    MRN: [id number] DOB: [birth year]  04/02/2020  Ms. Lehnen was observed post Covid-19 immunization for 15 minutes without incident. She was provided with Vaccine Information Sheet and instruction to access the V-Safe system.   Ms. Orebaugh was instructed to call 911 with any severe reactions post vaccine: Marland Kitchen Difficulty breathing  . Swelling of face and throat  . A fast heartbeat  . A bad rash all over body  . Dizziness and weakness   Immunizations Administered    Name Date Dose VIS Date Route   Pfizer COVID-19 Vaccine 04/02/2020  1:28 PM 0.3 mL 02/01/2019 Intramuscular   Manufacturer: [employer], [employer]   Lot: [id number]   NDC: [address]-7

## 2021-01-02 ENCOUNTER — Other Ambulatory Visit: Payer: Self-pay | Admitting: Family Medicine

## 2021-01-02 DIAGNOSIS — M8580 Other specified disorders of bone density and structure, unspecified site: Secondary | ICD-10-CM

## 2021-01-02 DIAGNOSIS — Z1231 Encounter for screening mammogram for malignant neoplasm of breast: Secondary | ICD-10-CM

## 2021-01-23 LAB — COLOGUARD: COLOGUARD: NEGATIVE

## 2021-05-08 ENCOUNTER — Telehealth: Payer: Self-pay | Admitting: Hematology and Oncology

## 2021-05-08 NOTE — Telephone Encounter (Signed)
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 Cheyenne Lucero returned my call and has been scheduled to see Cheyenne Lucero on 6/6 at 1pm. Pt aware to arrive 20 minutes early.

## 2021-05-09 ENCOUNTER — Other Ambulatory Visit: Payer: Self-pay

## 2021-05-09 ENCOUNTER — Ambulatory Visit
Admission: RE | Admit: 2021-05-09 | Discharge: 2021-05-09 | Disposition: A | Discharge status: Home / Self Care | Payer: BC Managed Care – PPO | Source: Ambulatory Visit | Attending: Family Medicine | Admitting: Family Medicine

## 2021-05-09 DIAGNOSIS — Z1231 Encounter for screening mammogram for malignant neoplasm of breast: Secondary | ICD-10-CM

## 2021-05-09 DIAGNOSIS — M8580 Other specified disorders of bone density and structure, unspecified site: Secondary | ICD-10-CM

## 2021-05-13 ENCOUNTER — Inpatient Hospital Stay: Payer: BC Managed Care – PPO

## 2021-05-13 ENCOUNTER — Inpatient Hospital Stay: Payer: BC Managed Care – PPO | Attending: Hematology and Oncology | Admitting: Hematology and Oncology

## 2021-05-13 ENCOUNTER — Other Ambulatory Visit: Payer: Self-pay

## 2021-05-13 ENCOUNTER — Encounter: Payer: Self-pay | Admitting: Hematology and Oncology

## 2021-05-13 VITALS — BP 140/90 | HR 58 | Temp 97.9°F | Resp 19 | Ht 64.0 in | Wt 152.7 lb

## 2021-05-13 DIAGNOSIS — J449 Chronic obstructive pulmonary disease, unspecified: Secondary | ICD-10-CM | POA: Insufficient documentation

## 2021-05-13 DIAGNOSIS — F17210 Nicotine dependence, cigarettes, uncomplicated: Secondary | ICD-10-CM | POA: Diagnosis not present

## 2021-05-13 DIAGNOSIS — Z86711 Personal history of pulmonary embolism: Secondary | ICD-10-CM | POA: Diagnosis not present

## 2021-05-13 DIAGNOSIS — D751 Secondary polycythemia: Secondary | ICD-10-CM | POA: Diagnosis present

## 2021-05-13 DIAGNOSIS — G4733 Obstructive sleep apnea (adult) (pediatric): Secondary | ICD-10-CM | POA: Insufficient documentation

## 2021-05-13 DIAGNOSIS — Z79899 Other long term (current) drug therapy: Secondary | ICD-10-CM | POA: Diagnosis not present

## 2021-05-13 LAB — CBC WITH DIFFERENTIAL (CANCER CENTER ONLY)
Abs Immature Granulocytes: 0.03 10*3/uL (ref 0.00–0.07)
Basophils Absolute: 0.1 10*3/uL (ref 0.0–0.1)
Basophils Relative: 1 %
Eosinophils Absolute: 0.1 10*3/uL (ref 0.0–0.5)
Eosinophils Relative: 1 %
HCT: 46.2 % — ABNORMAL HIGH (ref 36.0–46.0)
Hemoglobin: 16.2 g/dL — ABNORMAL HIGH (ref 12.0–15.0)
Immature Granulocytes: 0 %
Lymphocytes Relative: 31 %
Lymphs Abs: 2.8 10*3/uL (ref 0.7–4.0)
MCH: 34.1 pg — ABNORMAL HIGH (ref 26.0–34.0)
MCHC: 35.1 g/dL (ref 30.0–36.0)
MCV: 97.3 fL (ref 80.0–100.0)
Monocytes Absolute: 0.6 10*3/uL (ref 0.1–1.0)
Monocytes Relative: 7 %
Neutro Abs: 5.5 10*3/uL (ref 1.7–7.7)
Neutrophils Relative %: 60 %
Platelet Count: 276 10*3/uL (ref 150–400)
RBC: 4.75 MIL/uL (ref 3.87–5.11)
RDW: 12.4 % (ref 11.5–15.5)
WBC Count: 9 10*3/uL (ref 4.0–10.5)
nRBC: 0 % (ref 0.0–0.2)

## 2021-05-13 LAB — CMP (CANCER CENTER ONLY)
ALT: 32 U/L (ref 0–44)
AST: 21 U/L (ref 15–41)
Albumin: 4 g/dL (ref 3.5–5.0)
Alkaline Phosphatase: 105 U/L (ref 38–126)
Anion gap: 10 (ref 5–15)
BUN: 19 mg/dL (ref 6–20)
CO2: 28 mmol/L (ref 22–32)
Calcium: 9.7 mg/dL (ref 8.9–10.3)
Chloride: 102 mmol/L (ref 98–111)
Creatinine: 1.39 mg/dL — ABNORMAL HIGH (ref 0.44–1.00)
GFR, Estimated: 43 mL/min — ABNORMAL LOW (ref >60)
Glucose, Bld: 101 mg/dL — ABNORMAL HIGH (ref 70–99)
Potassium: 3.5 mmol/L (ref 3.5–5.1)
Sodium: 140 mmol/L (ref 135–145)
Total Bilirubin: 0.7 mg/dL (ref 0.3–1.2)
Total Protein: 7.6 g/dL (ref 6.5–8.1)

## 2021-05-13 LAB — IRON AND TIBC
Iron: 96 ug/dL (ref 41–142)
Saturation Ratios: 28 % (ref 21–57)
TIBC: 339 ug/dL (ref 236–444)
UIBC: 242 ug/dL (ref 120–384)

## 2021-05-13 LAB — FERRITIN: Ferritin: 126 ng/mL (ref 11–307)

## 2021-05-13 NOTE — Progress Notes (Signed)
[facility] [phone]   [phone]  INITIAL CONSULT NOTE  Patient Care Team: Dois Davenport, MD as PCP - General (Family Medicine)  Hematological/Oncological History # Polycythemia # Leukocytosis 05/09/2015: WBC 11.4, Hgb 15.4, MCV 90.2, Plt 214 02/07/2018: WBC 17.0, Hgb 14.3, MCV 90.2, Plt 196 05/13/2021: establish care with Dr. Leonides Schanz   CHIEF COMPLAINTS/PURPOSE OF CONSULTATION:  "Polycythemia "  HISTORY OF PRESENTING ILLNESS:  Cheyenne Lucero 60 y.o. female with medical history significant for COPD, PE, CAD, and OSA who presents for evaluation of polycythemia.   On review of the previous records [name] has a history of polycythemia dating back to at least 05/09/2015.  At that time she had a hemoglobin of 15.4.  In March 2019 she was found to have a hemoglobin of 17.4.  Most recently on 02/23/2018 the patient was found to have a hemoglobin 14.9, MCV of 89.9, white blood cell count of 12.3, and a platelet count of 184.  Due to concern for this patient's longstanding history of polycythemia she was referred to hematology for further evaluation management.  On exam today [name] reports that she has been told her red blood cell was high previously by the [employer].  She notes that this has been a problem for her for a few years.  She notes that she was a smoker but quit approximately 3 years ago.  She also has symptoms of snoring at night "more often than not".  She does not have any headaches or sleepiness throughout the day.  She does have a history of Graves' disease with radiation to the thyroid.  She does endorse having some occasional itching when getting out of the shower.  On further discussion her family history is remarkable for lupus in her mother.  She notes that her daughter also has hypothyroidism.  The patient does not currently drink any alcohol and is currently unemployed but previously worked in a [employer].  She notes that she  did recently have 1 episode of hives but no other major changes in her health.  Also of note the patient had a pulmonary embolism in November 2003 with an unclear cause.  She does that she received heparin in the hospital but never received any additional therapy.  She currently denies any fevers, chills, sweats, nausea, vomiting or diarrhea.  A full 10 point ROS is listed below.  MEDICAL HISTORY:  Past Medical History:  Diagnosis Date   COPD (chronic obstructive pulmonary disease) (HCC)    Coronary artery disease 2004   heart event from a blood clot    Pulmonary embolus (HCC)     SURGICAL HISTORY: Past Surgical History:  Procedure Laterality Date   LEFT HEART CATH      SOCIAL HISTORY: Social History   Socioeconomic History   Marital status: Media planner    Spouse name: Not on file   Number of children: Not on file   Years of education: Not on file   Highest education level: Not on file  Occupational History   Not on file  Tobacco Use   Smoking status: Every Day    Packs/day: 0.45    Pack years: 0.00    Types: Cigarettes   Smokeless tobacco: Never   Tobacco comments:    Pt states she is ready to quit smoking  Vaping Use   Vaping Use: Never used  Substance and Sexual Activity   Alcohol use: No   Drug use: No   Sexual  activity: Not on file  Other Topics Concern   Not on file  Social History Narrative   Not on file   Social Determinants of Health   Financial Resource Strain: Not on file  Food Insecurity: Not on file  Transportation Needs: Not on file  Physical Activity: Not on file  Stress: Not on file  Social Connections: Not on file  Intimate Partner Violence: Not on file    FAMILY HISTORY: Family History  Problem Relation Age of Onset   Lupus Mother     ALLERGIES:  is allergic to morphine and related and tramadol.  MEDICATIONS:  Current Outpatient Medications  Medication Sig Dispense Refill   cloNIDine (CATAPRES) 0.3 MG tablet Take 0.3 mg by  mouth daily.     Evolocumab (REPATHA) 140 MG/ML SOSY Inject into the skin.     acetaminophen (TYLENOL) 325 MG tablet Take 650 mg by mouth every 6 (six) hours as needed for mild pain.     buPROPion (WELLBUTRIN SR) 150 MG 12 hr tablet Take 150 mg by mouth daily.     Cholecalciferol (VITAMIN D3) 2000 units TABS Take 1 tablet by mouth daily.     EUTHYROX 100 MCG tablet Take 100 mcg by mouth every morning.     gabapentin (NEURONTIN) 300 MG capsule gabapentin 300 mg capsule     hydrochlorothiazide (HYDRODIURIL) 25 MG tablet      liothyronine (CYTOMEL) 5 MCG tablet liothyronine 5 mcg tablet     losartan (COZAAR) 100 MG tablet Take 1 tablet by mouth daily.     Metoprolol Succinate 50 MG CS24 1 in am, 2 at HS     No current facility-administered medications for this visit.    REVIEW OF SYSTEMS:   Constitutional: ( - ) fevers, ( - )  chills , ( - ) night sweats Eyes: ( - ) blurriness of vision, ( - ) double vision, ( - ) watery eyes Ears, nose, mouth, throat, and face: ( - ) mucositis, ( - ) sore throat Respiratory: ( - ) cough, ( - ) dyspnea, ( - ) wheezes Cardiovascular: ( - ) palpitation, ( - ) chest discomfort, ( - ) lower extremity swelling Gastrointestinal:  ( - ) nausea, ( - ) heartburn, ( - ) change in bowel habits Skin: ( - ) abnormal skin rashes Lymphatics: ( - ) new lymphadenopathy, ( - ) easy bruising Neurological: ( - ) numbness, ( - ) tingling, ( - ) new weaknesses Behavioral/Psych: ( - ) mood change, ( - ) new changes  All other systems were reviewed with the patient and are negative.  PHYSICAL EXAMINATION:  Vitals:   05/13/21 1309  BP: 140/90  Pulse: (!) 58  Resp: 19  Temp: 97.9 F (36.6 C)  SpO2: 98%   Filed Weights   05/13/21 1309  Weight: 152 lb 11.2 oz (69.3 kg)    GENERAL: well appearing middle-aged Caucasian female in NAD  SKIN: skin color, texture, turgor are normal, no rashes or significant lesions EYES: conjunctiva are pink and non-injected, sclera  clear LUNGS: clear to auscultation and percussion with normal breathing effort HEART: regular rate & rhythm and no murmurs and no lower extremity edema PSYCH: alert & oriented x 3, fluent speech NEURO: no focal motor/sensory deficits  LABORATORY DATA:  I have reviewed the data as listed CBC Latest Ref Rng & Units 05/13/2021 02/23/2018 02/07/2018  WBC 4.0 - 10.5 K/uL 9.0 12.3(H) 17.0(H)  Hemoglobin 12.0 - 15.0 g/dL 16.2(H) 14.9 14.3  Hematocrit  36.0 - 46.0 % 46.2(H) 42.6 40.3  Platelets 150 - 400 K/uL 276 184 196    CMP Latest Ref Rng & Units 05/13/2021 02/23/2018 02/06/2018  Glucose 70 - 99 mg/dL 956(O101(H) 85 130(Q102(H)  BUN 6 - 20 mg/dL 19 8 10   Creatinine 0.44 - 1.00 mg/dL 6.57(Q1.39(H) 4.690.52 6.290.62  Sodium 135 - 145 mmol/L 140 141 140  Potassium 3.5 - 5.1 mmol/L 3.5 3.9 3.9  Chloride 98 - 111 mmol/L 102 109 108  CO2 22 - 32 mmol/L 28 22 20(L)  Calcium 8.9 - 10.3 mg/dL 9.7 9.5 52.810.3  Total Protein 6.5 - 8.1 g/dL 7.6 - 7.6  Total Bilirubin 0.3 - 1.2 mg/dL 0.7 - 1.0  Alkaline Phos 38 - 126 U/L 105 - 130(H)  AST 15 - 41 U/L 21 - 30  ALT 0 - 44 U/L 32 - 34    RADIOGRAPHIC STUDIES: DG BONE DENSITY (DXA)  Result Date: 05/09/2021 EXAM: DUAL X-RAY ABSORPTIOMETRY (DXA) FOR BONE MINERAL DENSITY IMPRESSION: Referring Physician:  Dois DavenportKAREN L RICHTER Your patient completed a bone mineral density test using GE Lunar iDXA system (analysis version: 16). Technologist: EH PATIENT: Name: Jola BabinskiSirmans, Beckham M Patient ID: [id number] Birth Date: [birth year] Height: 64.0 in. Sex: Female Measured: 05/09/2021 Weight: 150.6 lbs. Indications: Caucasian, Estrogen Deficient, Gabapentin, Graves Disease, History of Fracture (Adult) (V15.51), Hypothyroid, Levothyroxine, scoliosis, Wellbutrin, Secondary Osteoporosis Fractures: Finger, Left wrist Treatments: Vitamin D (E933.5) ASSESSMENT: The BMD measured at Forearm Radius 33% is 0.698 g/cm2 with a T-score of -2.1. This patient is considered osteopenic/low bone mass according to World Health  Organization [facility](WHO) criteria. The quality of the exam is good. The lumbar spine was excluded due to scoliosis as it was excluded on previous exam. Site Region Measured Date Measured Age YA BMD Significant CHANGE T-score Left Forearm Radius 33% 05/09/2021 60.6 -2.1 0.698 g/cm2 Left Forearm Radius 33% 10/19/2018 58.0 -2.3 0.683 g/cm2 DualFemur Total Left 05/09/2021 60.6 -1.6 0.800 g/cm2 * DualFemur Total Left 10/19/2018 58.0 -2.2 0.726 g/cm2 DualFemur Total Mean 05/09/2021 60.6 -1.6 0.801 g/cm2 * DualFemur Total Mean 10/19/2018 58.0 -2.1 0.741 g/cm2 World Health Organization [facility](WHO) criteria for post-menopausal, Caucasian Women: Normal       T-score at or above -1 SD Osteopenia   T-score between -1 and -2.5 SD Osteoporosis T-score at or below -2.5 SD RECOMMENDATION: 1. All patients should optimize calcium and vitamin D intake. 2. Consider FDA-approved medical therapies in postmenopausal women and men aged 61 years and older, based on the following: a. A hip or vertebral (clinical or morphometric) fracture. b. T-score = -2.5 at the femoral neck or spine after appropriate evaluation to exclude secondary causes. c. Low bone mass (T-score between -1.0 and -2.5 at the femoral neck or spine) and a 10-year probability of a hip fracture = 3% or a 10-year probability of a major osteoporosis-related fracture = 20% based on the [place]-adapted WHO algorithm. d. Clinician judgment and/or patient preferences may indicate treatment for people with 10-year fracture probabilities above or below these levels. FOLLOW-UP: Patients with diagnosis of osteoporosis or at high risk for fracture should have regular bone mineral density tests.? Patients eligible for Medicare are allowed routine testing every 2 years.? The testing frequency can be increased to one year for patients who have rapidly progressing disease, are receiving or discontinuing medical therapy to restore bone mass, or have additional risk factors. I have reviewed this study and  agree with the findings. [facility] FRAX* 10-year Probability of Fracture Based on femoral neck BMD: DualFemur (  Left) Major Osteoporotic Fracture: 14.4% Hip Fracture:                1.4% Population:                  [place] (Caucasian) Risk Factors: History of Fracture (Adult) (V15.51), Secondary Osteoporosis *FRAX is a Armed forces logistics/support/administrative officer of the [employer] for Metabolic Bone Disease, a World Science writer (WHO) [employer]. ASSESSMENT: The probability of a major osteoporotic fracture is 14.4% within the next ten years. The probability of a hip fracture is 14.4% within the next ten years. Electronically Signed   By: Bretta Bang III M.D.   On: 05/09/2021 15:11   MM 3D SCREEN BREAST BILATERAL  Result Date: 05/14/2021 CLINICAL DATA:  Screening. EXAM: DIGITAL SCREENING BILATERAL MAMMOGRAM WITH TOMOSYNTHESIS AND CAD TECHNIQUE: Bilateral screening digital craniocaudal and mediolateral oblique mammograms were obtained. Bilateral screening digital breast tomosynthesis was performed. The images were evaluated with computer-aided detection. COMPARISON:  Previous exam(s). ACR Breast Density Category b: There are scattered areas of fibroglandular density. FINDINGS: There are no findings suspicious for malignancy. The images were evaluated with computer-aided detection. IMPRESSION: No mammographic evidence of malignancy. A result letter of this screening mammogram will be mailed directly to the patient. RECOMMENDATION: Screening mammogram in one year. (Code:SM-B-01Y) BI-RADS CATEGORY  1: Negative. Electronically Signed   By: Frederico Hamman M.D.   On: 05/14/2021 09:00     ASSESSMENT & PLAN Cheyenne Lucero 60 y.o. female with medical history significant for COPD, PE, CAD, and OSA who presents for evaluation of polycythemia.   After review of the labs, review of the records, and discussion with the patient the patients findings are most consistent with  polycythemia 2/2 to OSA.  Patient has a known history of OSA and her current levels of hemoglobin which fluctuate currently support that diagnosis.  In order to assure that this is the diagnosis we will also order erythropoietin levels, JAK2 panel with reflex, and BCR/ABL FISH.  Assuming these levels are normal there is no need for routine follow-up in our clinic.  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 --The transient nature of this patient's polycythemia also supports this diagnosis. --In order to support the diagnosis we will also order erythropoietin levels, JAK2 panel with reflex, and BCR/ABL FISH -- Encourage patient to follow-up with her obstructive sleep apnea physician and use a CPAP machine. --No need for routine follow-up in our clinic as long as the genetic testing is negative.  Orders Placed This Encounter  Procedures   CBC with Differential (Cancer Center Only)    Standing Status:   Future    Number of Occurrences:   1    Standing Expiration Date:   05/13/2022   CMP (Cancer Center only)    Standing Status:   Future    Number of Occurrences:   1    Standing Expiration Date:   05/13/2022   JAK2 (INCLUDING V617F AND EXON 12), MPL,& CALR W/RFL MPN PANEL (NGS)    Standing Status:   Future    Number of Occurrences:   1    Standing Expiration Date:   05/13/2022   BCR ABL1 FISH ([facility])    Standing Status:   Future    Number of Occurrences:   1    Standing Expiration Date:   05/13/2022   Iron and TIBC    Standing Status:  Future    Number of Occurrences:   1    Standing Expiration Date:   05/13/2022   Ferritin    Standing Status:   Future    Number of Occurrences:   1    Standing Expiration Date:   05/13/2022   Erythropoietin    Standing Status:   Future    Number of Occurrences:   1    Standing Expiration Date:   05/13/2022    All questions were answered. The patient knows to call  the clinic with any problems, questions or concerns.  A total of more than 60 minutes were spent on this encounter with face-to-face time and non-face-to-face time, including preparing to see the patient, ordering tests and/or medications, counseling the patient and coordination of care as outlined above.   Ulysees Barns, MD Department of Hematology/Oncology [facility] at [facility] Phone: [phone] Pager: [phone] Email: [email or web address]  05/21/2021 12:51 PM

## 2021-05-14 LAB — ERYTHROPOIETIN: Erythropoietin: 9.4 m[IU]/mL (ref 2.6–18.5)

## 2021-05-21 ENCOUNTER — Telehealth: Payer: Self-pay

## 2021-05-21 LAB — JAK2 (INCLUDING V617F AND EXON 12), MPL,& CALR W/RFL MPN PANEL (NGS)

## 2021-05-21 LAB — BCR ABL1 FISH (GENPATH)

## 2021-05-21 NOTE — Telephone Encounter (Signed)
-----   Message from Jaci Standard, MD sent at 05/21/2021 12:53 PM EDT ----- Baird Lyons,  Please let Mrs. Tamayo know that her labs showed no concerning abnormalities causing her high Hemoglobin. The high hemoglobin is most likely secondary to her obstructive sleep apnea. There is no need for routine f/u in our clinic.  ----- Message ----- From: Interface, Lab In [place] Sent: 05/13/2021   2:40 PM EDT To: Jaci Standard, MD

## 2021-05-21 NOTE — Telephone Encounter (Signed)
I spoke with the patient to relay the lab results. She verbalized understanding.

## 2022-11-11 ENCOUNTER — Other Ambulatory Visit: Payer: Self-pay | Admitting: Family Medicine

## 2022-11-11 DIAGNOSIS — Z1231 Encounter for screening mammogram for malignant neoplasm of breast: Secondary | ICD-10-CM

## 2023-01-12 ENCOUNTER — Ambulatory Visit
Admission: RE | Admit: 2023-01-12 | Discharge: 2023-01-12 | Disposition: A | Discharge status: Home / Self Care | Payer: BC Managed Care – PPO | Source: Ambulatory Visit | Attending: Family Medicine | Admitting: Family Medicine

## 2023-01-12 DIAGNOSIS — Z1231 Encounter for screening mammogram for malignant neoplasm of breast: Secondary | ICD-10-CM

## 2024-05-19 ENCOUNTER — Encounter (HOSPITAL_COMMUNITY): Payer: Self-pay | Admitting: Family Medicine

## 2024-05-19 ENCOUNTER — Other Ambulatory Visit (HOSPITAL_COMMUNITY): Payer: Self-pay | Admitting: Family Medicine

## 2024-05-19 DIAGNOSIS — I10 Essential (primary) hypertension: Secondary | ICD-10-CM

## 2024-05-20 ENCOUNTER — Ambulatory Visit (HOSPITAL_COMMUNITY)
Admission: RE | Admit: 2024-05-20 | Discharge: 2024-05-20 | Disposition: A | Discharge status: Home / Self Care | Source: Ambulatory Visit | Attending: Vascular Surgery

## 2024-05-20 DIAGNOSIS — I10 Essential (primary) hypertension: Secondary | ICD-10-CM | POA: Diagnosis present

## 2024-07-13 ENCOUNTER — Emergency Department (HOSPITAL_BASED_OUTPATIENT_CLINIC_OR_DEPARTMENT_OTHER): Admitting: Radiology

## 2024-07-13 ENCOUNTER — Inpatient Hospital Stay (HOSPITAL_BASED_OUTPATIENT_CLINIC_OR_DEPARTMENT_OTHER)
Admission: EM | Admit: 2024-07-13 | Discharge: 2024-07-17 | DRG: 871 | Disposition: A | Discharge status: Home / Self Care | Attending: Student | Admitting: Student

## 2024-07-13 ENCOUNTER — Emergency Department (HOSPITAL_BASED_OUTPATIENT_CLINIC_OR_DEPARTMENT_OTHER)

## 2024-07-13 ENCOUNTER — Other Ambulatory Visit: Payer: Self-pay

## 2024-07-13 ENCOUNTER — Encounter (HOSPITAL_BASED_OUTPATIENT_CLINIC_OR_DEPARTMENT_OTHER): Payer: Self-pay | Admitting: Emergency Medicine

## 2024-07-13 DIAGNOSIS — F32A Depression, unspecified: Secondary | ICD-10-CM | POA: Diagnosis present

## 2024-07-13 DIAGNOSIS — J189 Pneumonia, unspecified organism: Secondary | ICD-10-CM | POA: Diagnosis present

## 2024-07-13 DIAGNOSIS — Z7982 Long term (current) use of aspirin: Secondary | ICD-10-CM

## 2024-07-13 DIAGNOSIS — R079 Chest pain, unspecified: Secondary | ICD-10-CM | POA: Diagnosis not present

## 2024-07-13 DIAGNOSIS — F17210 Nicotine dependence, cigarettes, uncomplicated: Secondary | ICD-10-CM | POA: Diagnosis present

## 2024-07-13 DIAGNOSIS — J9601 Acute respiratory failure with hypoxia: Secondary | ICD-10-CM | POA: Diagnosis present

## 2024-07-13 DIAGNOSIS — I3139 Other pericardial effusion (noninflammatory): Secondary | ICD-10-CM | POA: Diagnosis present

## 2024-07-13 DIAGNOSIS — I2489 Other forms of acute ischemic heart disease: Secondary | ICD-10-CM | POA: Diagnosis present

## 2024-07-13 DIAGNOSIS — I951 Orthostatic hypotension: Secondary | ICD-10-CM | POA: Diagnosis not present

## 2024-07-13 DIAGNOSIS — E785 Hyperlipidemia, unspecified: Secondary | ICD-10-CM | POA: Diagnosis present

## 2024-07-13 DIAGNOSIS — G629 Polyneuropathy, unspecified: Secondary | ICD-10-CM | POA: Diagnosis present

## 2024-07-13 DIAGNOSIS — R072 Precordial pain: Principal | ICD-10-CM

## 2024-07-13 DIAGNOSIS — N179 Acute kidney failure, unspecified: Secondary | ICD-10-CM | POA: Diagnosis present

## 2024-07-13 DIAGNOSIS — N1831 Chronic kidney disease, stage 3a: Secondary | ICD-10-CM | POA: Diagnosis present

## 2024-07-13 DIAGNOSIS — Z8679 Personal history of other diseases of the circulatory system: Secondary | ICD-10-CM

## 2024-07-13 DIAGNOSIS — I301 Infective pericarditis: Secondary | ICD-10-CM | POA: Diagnosis present

## 2024-07-13 DIAGNOSIS — Z79899 Other long term (current) drug therapy: Secondary | ICD-10-CM

## 2024-07-13 DIAGNOSIS — R0789 Other chest pain: Secondary | ICD-10-CM | POA: Diagnosis present

## 2024-07-13 DIAGNOSIS — R7989 Other specified abnormal findings of blood chemistry: Secondary | ICD-10-CM

## 2024-07-13 DIAGNOSIS — Z1152 Encounter for screening for COVID-19: Secondary | ICD-10-CM | POA: Diagnosis not present

## 2024-07-13 DIAGNOSIS — I129 Hypertensive chronic kidney disease with stage 1 through stage 4 chronic kidney disease, or unspecified chronic kidney disease: Secondary | ICD-10-CM | POA: Diagnosis present

## 2024-07-13 DIAGNOSIS — A419 Sepsis, unspecified organism: Principal | ICD-10-CM | POA: Diagnosis present

## 2024-07-13 DIAGNOSIS — Z86711 Personal history of pulmonary embolism: Secondary | ICD-10-CM

## 2024-07-13 DIAGNOSIS — J441 Chronic obstructive pulmonary disease with (acute) exacerbation: Secondary | ICD-10-CM | POA: Diagnosis not present

## 2024-07-13 DIAGNOSIS — I2510 Atherosclerotic heart disease of native coronary artery without angina pectoris: Secondary | ICD-10-CM | POA: Diagnosis present

## 2024-07-13 DIAGNOSIS — R6520 Severe sepsis without septic shock: Secondary | ICD-10-CM | POA: Diagnosis present

## 2024-07-13 DIAGNOSIS — Z79890 Hormone replacement therapy (postmenopausal): Secondary | ICD-10-CM | POA: Diagnosis not present

## 2024-07-13 DIAGNOSIS — Z8269 Family history of other diseases of the musculoskeletal system and connective tissue: Secondary | ICD-10-CM

## 2024-07-13 DIAGNOSIS — J440 Chronic obstructive pulmonary disease with acute lower respiratory infection: Secondary | ICD-10-CM | POA: Diagnosis present

## 2024-07-13 DIAGNOSIS — E538 Deficiency of other specified B group vitamins: Secondary | ICD-10-CM | POA: Diagnosis present

## 2024-07-13 DIAGNOSIS — R0602 Shortness of breath: Secondary | ICD-10-CM

## 2024-07-13 DIAGNOSIS — I10 Essential (primary) hypertension: Secondary | ICD-10-CM | POA: Diagnosis not present

## 2024-07-13 DIAGNOSIS — E039 Hypothyroidism, unspecified: Secondary | ICD-10-CM | POA: Diagnosis not present

## 2024-07-13 DIAGNOSIS — Z885 Allergy status to narcotic agent status: Secondary | ICD-10-CM | POA: Diagnosis not present

## 2024-07-13 HISTORY — DX: Essential (primary) hypertension: I10

## 2024-07-13 HISTORY — DX: Hypothyroidism, unspecified: E03.9

## 2024-07-13 LAB — CBC
HCT: 43.9 % (ref 36.0–46.0)
Hemoglobin: 15.1 g/dL — ABNORMAL HIGH (ref 12.0–15.0)
MCH: 33.6 pg (ref 26.0–34.0)
MCHC: 34.4 g/dL (ref 30.0–36.0)
MCV: 97.8 fL (ref 80.0–100.0)
Platelets: 385 K/uL (ref 150–400)
RBC: 4.49 MIL/uL (ref 3.87–5.11)
RDW: 14.1 % (ref 11.5–15.5)
WBC: 14.1 K/uL — ABNORMAL HIGH (ref 4.0–10.5)
nRBC: 0 % (ref 0.0–0.2)

## 2024-07-13 LAB — BASIC METABOLIC PANEL WITH GFR
Anion gap: 15 (ref 5–15)
BUN: 39 mg/dL — ABNORMAL HIGH (ref 8–23)
CO2: 25 mmol/L (ref 22–32)
Calcium: 10 mg/dL (ref 8.9–10.3)
Chloride: 97 mmol/L — ABNORMAL LOW (ref 98–111)
Creatinine, Ser: 2.08 mg/dL — ABNORMAL HIGH (ref 0.44–1.00)
GFR, Estimated: 26 mL/min — ABNORMAL LOW (ref >60)
Glucose, Bld: 129 mg/dL — ABNORMAL HIGH (ref 70–99)
Potassium: 4.4 mmol/L (ref 3.5–5.1)
Sodium: 136 mmol/L (ref 135–145)

## 2024-07-13 LAB — TROPONIN T, HIGH SENSITIVITY
Troponin T High Sensitivity: 34 ng/L — ABNORMAL HIGH (ref <19)
Troponin T High Sensitivity: 29 ng/L — ABNORMAL HIGH (ref <19)

## 2024-07-13 LAB — RESP PANEL BY RT-PCR (RSV, FLU A&B, COVID)  RVPGX2
Influenza A by PCR: NEGATIVE
Influenza B by PCR: NEGATIVE
Resp Syncytial Virus by PCR: NEGATIVE
SARS Coronavirus 2 by RT PCR: NEGATIVE

## 2024-07-13 LAB — D-DIMER, QUANTITATIVE: D-Dimer, Quant: 3.89 ug{FEU}/mL — ABNORMAL HIGH (ref 0.00–0.50)

## 2024-07-13 MED ORDER — MELATONIN 3 MG PO TABS: 3.0000 mg | ORAL_TABLET | Freq: Every evening | ORAL | Status: DC | PRN

## 2024-07-13 MED ORDER — SODIUM CHLORIDE 0.9 % IV SOLN: INTRAVENOUS | Status: DC

## 2024-07-13 MED ORDER — HYDROMORPHONE HCL 1 MG/ML IJ SOLN
1.0000 mg | Freq: Once | INTRAMUSCULAR | Status: AC
  Administered 2024-07-13: 1 mg via INTRAVENOUS
  Filled 2024-07-13: qty 1

## 2024-07-13 MED ORDER — GUAIFENESIN ER 600 MG PO TB12
600.0000 mg | ORAL_TABLET | Freq: Two times a day (BID) | ORAL | Status: DC
  Administered 2024-07-13 – 2024-07-17 (×8): 600 mg via ORAL
  Filled 2024-07-13 (×8): qty 1

## 2024-07-13 MED ORDER — SODIUM CHLORIDE 0.9 % IV SOLN
1.0000 g | Freq: Once | INTRAVENOUS | Status: AC
  Administered 2024-07-13: 1 g via INTRAVENOUS
  Filled 2024-07-13: qty 10

## 2024-07-13 MED ORDER — METHYLPREDNISOLONE SODIUM SUCC 125 MG IJ SOLR
80.0000 mg | Freq: Two times a day (BID) | INTRAMUSCULAR | Status: DC
  Administered 2024-07-14: 80 mg via INTRAVENOUS
  Filled 2024-07-13: qty 1.28

## 2024-07-13 MED ORDER — NALOXONE HCL 0.4 MG/ML IJ SOLN: 0.4000 mg | INTRAMUSCULAR | Status: DC | PRN

## 2024-07-13 MED ORDER — SODIUM CHLORIDE 0.9 % IV SOLN
1.0000 g | INTRAVENOUS | Status: DC
  Administered 2024-07-14 – 2024-07-16 (×3): 1 g via INTRAVENOUS
  Filled 2024-07-13 (×3): qty 10

## 2024-07-13 MED ORDER — ENOXAPARIN SODIUM 80 MG/0.8ML IJ SOSY
1.0000 mg/kg | PREFILLED_SYRINGE | Freq: Once | INTRAMUSCULAR | Status: AC
  Administered 2024-07-13: 70 mg via SUBCUTANEOUS
  Filled 2024-07-13: qty 0.8

## 2024-07-13 MED ORDER — ACETAMINOPHEN 325 MG PO TABS
650.0000 mg | ORAL_TABLET | Freq: Four times a day (QID) | ORAL | Status: DC | PRN
  Administered 2024-07-14: 650 mg via ORAL
  Filled 2024-07-13: qty 2

## 2024-07-13 MED ORDER — ACETAMINOPHEN 650 MG RE SUPP: 650.0000 mg | Freq: Four times a day (QID) | RECTAL | Status: DC | PRN

## 2024-07-13 MED ORDER — BENZONATATE 100 MG PO CAPS: 200.0000 mg | ORAL_CAPSULE | Freq: Three times a day (TID) | ORAL | Status: DC | PRN

## 2024-07-13 MED ORDER — SODIUM CHLORIDE 0.9 % IV SOLN
500.0000 mg | Freq: Once | INTRAVENOUS | Status: AC
  Administered 2024-07-13: 500 mg via INTRAVENOUS
  Filled 2024-07-13: qty 5

## 2024-07-13 MED ORDER — ALBUTEROL SULFATE (2.5 MG/3ML) 0.083% IN NEBU: 2.5000 mg | INHALATION_SOLUTION | RESPIRATORY_TRACT | Status: DC | PRN

## 2024-07-13 MED ORDER — ONDANSETRON HCL 4 MG/2ML IJ SOLN
4.0000 mg | Freq: Once | INTRAMUSCULAR | Status: AC
  Administered 2024-07-13: 4 mg via INTRAVENOUS
  Filled 2024-07-13: qty 2

## 2024-07-13 MED ORDER — SODIUM CHLORIDE 0.9 % IV SOLN
500.0000 mg | INTRAVENOUS | Status: DC
  Filled 2024-07-13: qty 5

## 2024-07-13 MED ORDER — SODIUM CHLORIDE 0.9 % IV BOLUS
500.0000 mL | Freq: Once | INTRAVENOUS | Status: AC
  Administered 2024-07-13: 500 mL via INTRAVENOUS

## 2024-07-13 MED ORDER — ONDANSETRON HCL 4 MG/2ML IJ SOLN
4.0000 mg | Freq: Four times a day (QID) | INTRAMUSCULAR | Status: DC | PRN
  Administered 2024-07-14: 4 mg via INTRAVENOUS
  Filled 2024-07-13: qty 2

## 2024-07-13 MED ORDER — FENTANYL CITRATE PF 50 MCG/ML IJ SOSY
25.0000 ug | PREFILLED_SYRINGE | INTRAMUSCULAR | Status: DC | PRN
  Administered 2024-07-14: 25 ug via INTRAVENOUS
  Filled 2024-07-13: qty 1

## 2024-07-13 MED ORDER — IPRATROPIUM-ALBUTEROL 0.5-2.5 (3) MG/3ML IN SOLN
3.0000 mL | Freq: Four times a day (QID) | RESPIRATORY_TRACT | Status: DC
  Administered 2024-07-15 – 2024-07-16 (×5): 3 mL via RESPIRATORY_TRACT
  Filled 2024-07-13 (×6): qty 3

## 2024-07-13 NOTE — Plan of Care (Signed)

## 2024-07-13 NOTE — ED Notes (Signed)
 Report given to Merlynn PEAK at [facility]. Awaiting carelink for transfer.

## 2024-07-13 NOTE — ED Provider Notes (Addendum)
 [place] [facility] AT [facility] Provider Note   CSN: [id number] Arrival date & time: 07/13/24  1245     Patient presents with: Chest Pain   Cheyenne Lucero is a 63 y.o. female.   Patient with a 4-day history of some cough some exertional shortness of breath and chest pain across the upper left and right side of the chest.  Shortness make is worse with the coughing.  Fatigue some joint pain Home COVID test was negative.  Patient has been diagnosed with COPD in the past.  But not ever had it caused her much problem.  Past medical history significant for COPD coronary artery disease past history of pulmonary embolus.  Patient states that she is quit smoking.  Patient with some respiratory symptoms.  She says does not necessarily feel like COVID.  Somewhat reassuring home COVID test was positive.  Will go ahead and do a respiratory panel to evaluate for flu and RSV.       Prior to Admission medications   Medication Sig Start Date End Date Taking? Authorizing Provider  amLODipine (NORVASC) 10 MG tablet Take 10 mg by mouth daily. 03/31/24  Yes [provider]  ezetimibe  (ZETIA ) 10 MG tablet Take 10 mg by mouth daily. 04/25/24  Yes [provider]  metoprolol  tartrate (LOPRESSOR ) 50 MG tablet Take 50 mg by mouth 2 (two) times daily. 04/25/24  Yes [provider]  pregabalin  (LYRICA ) 50 MG capsule Take 50 mg by mouth 3 (three) times daily. 04/25/24  Yes [provider]  acetaminophen  (TYLENOL ) 325 MG tablet Take 650 mg by mouth every 6 (six) hours as needed for mild pain.    [provider]  buPROPion  (WELLBUTRIN  SR) 150 MG 12 hr tablet Take 150 mg by mouth daily.    [provider]  Cholecalciferol  (VITAMIN D3) 2000 units TABS Take 1 tablet by mouth daily.    [provider]  cloNIDine  (CATAPRES ) 0.3 MG tablet Take 0.3 mg by mouth daily.    [provider]  EUTHYROX  100 MCG tablet Take 100 mcg by  mouth every morning. 03/07/21   [provider]  Evolocumab (REPATHA) 140 MG/ML SOSY Inject into the skin.    [provider]  gabapentin (NEURONTIN) 300 MG capsule gabapentin 300 mg capsule    [provider]  hydrochlorothiazide (HYDRODIURIL) 25 MG tablet     [provider]  liothyronine  (CYTOMEL ) 5 MCG tablet liothyronine  5 mcg tablet    [provider]  losartan  (COZAAR ) 100 MG tablet Take 1 tablet by mouth daily. 03/13/21   [provider]  Metoprolol  Succinate 50 MG CS24 1 in am, 2 at [facility]    [provider]  mirtazapine  (REMERON ) 30 MG tablet Take 30 mg by mouth at bedtime.    [provider]    Allergies: Morphine and codeine and Tramadol     Review of Systems  Constitutional:  Negative for chills and fever.  HENT:  Negative for ear pain and sore throat.   Eyes:  Negative for pain and visual disturbance.  Respiratory:  Positive for cough and shortness of breath.   Cardiovascular:  Positive for chest pain. Negative for palpitations and leg swelling.  Gastrointestinal:  Negative for abdominal pain and vomiting.  Genitourinary:  Negative for dysuria and hematuria.  Musculoskeletal:  Positive for arthralgias. Negative for back pain.  Skin:  Negative for color change and rash.  Neurological:  Negative for seizures and syncope.  All other systems  reviewed and are negative.   Updated Vital Signs BP (!) 120/58 (BP Location: Right Arm)   Pulse (!) 58   Temp 97.8 F (36.6 C) (Oral)   Resp 18   SpO2 94%   Physical Exam Vitals and nursing note reviewed.  Constitutional:      General: She is not in acute distress.    Appearance: Normal appearance. She is well-developed. She is not ill-appearing.  HENT:     Head: Normocephalic and atraumatic.     Mouth/Throat:     Mouth: Mucous membranes are moist.  Eyes:     Extraocular Movements: Extraocular movements intact.     Conjunctiva/sclera: Conjunctivae normal.      Pupils: Pupils are equal, round, and reactive to light.  Cardiovascular:     Rate and Rhythm: Normal rate and regular rhythm.     Heart sounds: No murmur heard. Pulmonary:     Effort: Pulmonary effort is normal. No respiratory distress.     Breath sounds: Normal breath sounds. No stridor. No wheezing, rhonchi or rales.  Abdominal:     Palpations: Abdomen is soft.     Tenderness: There is no abdominal tenderness.  Musculoskeletal:        General: No swelling.     Cervical back: Normal range of motion and neck supple. No rigidity.     Right lower leg: No edema.     Left lower leg: No edema.  Skin:    General: Skin is warm and dry.     Capillary Refill: Capillary refill takes less than 2 seconds.  Neurological:     General: No focal deficit present.     Mental Status: She is alert and oriented to person, place, and time.  Psychiatric:        Mood and Affect: Mood normal.     (all labs ordered are listed, but only abnormal results are displayed) Labs Reviewed  BASIC METABOLIC PANEL WITH GFR - Abnormal; Notable for the following components:      Result Value   Chloride 97 (*)    Glucose, Bld 129 (*)    BUN 39 (*)    Creatinine, Ser 2.08 (*)    GFR, Estimated 26 (*)    All other components within normal limits  CBC - Abnormal; Notable for the following components:   WBC 14.1 (*)    Hemoglobin 15.1 (*)    All other components within normal limits  D-DIMER, QUANTITATIVE - Abnormal; Notable for the following components:   D-Dimer, Quant 3.89 (*)    All other components within normal limits  TROPONIN T, HIGH SENSITIVITY - Abnormal; Notable for the following components:   Troponin T High Sensitivity 34 (*)    All other components within normal limits  RESP PANEL BY RT-PCR (RSV, FLU A&B, COVID)  [id number]  TROPONIN T, HIGH SENSITIVITY    EKG: EKG Interpretation Date/Time:  Wednesday July 13 2024 12:56:04 EDT Ventricular Rate:  60 PR Interval:  162 QRS Duration:  92 QT  Interval:  344 QTC Calculation: 344 R Axis:   106  Text Interpretation: Sinus rhythm Right axis deviation Borderline T abnormalities, anterior leads Borderline ST elevation, inferior leads Confirmed by Jazzma Neidhardt [id number]) on 07/13/2024 1:38:34 PM  Radiology: [facility] Chest 2 View Result Date: 07/13/2024 CLINICAL DATA:  chest pain EXAM: CHEST - 2 VIEW COMPARISON:  February 23, 2018 FINDINGS: Small left pleural effusion with patchy left basilar airspace opacities, likely atelectasis. Mild cardiomegaly. Tortuous aorta with aortic atherosclerosis. Moderate dextrocurvature  of the thoracic spine, unchanged. No acute fracture or destructive lesions. Multilevel thoracic osteophytosis. IMPRESSION: Small left pleural effusion. Patchy left basilar airspace opacities, which may represent atelectasis or a developing bronchopneumonia, in the correct clinical context. Electronically Signed   By: Rogelia Myers M.D.   On: 07/13/2024 14:27     Procedures   Medications Ordered in the ED - No data to display                                  Medical Decision Making Amount and/or Complexity of Data Reviewed Labs: ordered. Radiology: ordered.  Risk Prescription drug management. Decision regarding hospitalization.   Patient's oxygen level 94%.  Does have cough does have exertional shortness of breath past history of pulmonary embolus.  Will get D-dimer.  Initial troponin was somewhat elevated at 34.  Will need delta troponin.  Symptoms somewhat atypical for coronary disease.  But needs to be ruled out.  White count is 14.1 hemoglobin 15.1 platelets 385.  Chest x-ray is pending.  And D-dimer has been ordered and pending.  Also will get respiratory panel.  Renal function GFR is 26 with a creatinine of 2.08.  Last time patient had anything checked was in 2022.  And creatinine was 1.38 at that time.  2019 renal function was normal.  And renal function was essentially normal then.  Electrolyte wise sodium potassium  glucose all normal.  Anion gap is normal at 15.  Patient is D-dimer markedly elevated at 3.89.  As mentioned already patient's GFR 26 with a creatinine of 2.80 is significantly changed.  Will probably add on CT abdomen as well just to make sure there is no renal obstruction.  Patient's D-dimer being elevated raises concerns for PE.  Patient's GFR probably does not allow for CT angio so probably need VQ scan.  Will need admission probably due to the acute kidney injury and also for possible PE and VQ scan.  Patient's respiratory panel was negative.  Will going get CT chest/CT abdomen without to evaluate for the chest x-ray findings were pneumonia is raised.  And to make sure no renal obstruction.  Chest x-ray noted small left pleural effusion patchy left basilar airspace opacities which could be represent developing bronchopneumonia.  Patient does have a little bit elevated white count so this could be appropriate as well.  Will start with antibiotics for the pneumonia.  Will get CT chest without to further evaluate possibility of pneumonia.  And may need to be admitted for VQ scan and treatment for the acute kidney injury.  Started on low Lovenox  for now.  Delta troponin second 129 which is lower.  No evidence of any acute cardiac event.  Will contact hospitalist for admission.  In addition patient now has an oxygen requirement does not have oxygen at home.  Sats on on room air were going down to around 88%.  So started on 2 L.  Discussed with the hospitalist they will admit.      Final diagnoses:  Precordial pain  Exertional shortness of breath  Acute kidney injury (HCC)  Elevated d-dimer  Pneumonia of left lower lobe due to infectious organism    ED Discharge Orders     None          Geraldene Hamilton, MD 07/13/24 1352    Geraldene Hamilton, MD 07/13/24 1359    Geraldene Hamilton, MD 07/13/24 1454    Swayzie Choate,  MD 07/13/24 1524    Geraldene Hamilton, MD 07/13/24  1652    Geraldene Hamilton, MD 07/13/24 1726

## 2024-07-13 NOTE — H&P (Signed)
 History and Physical      Cheyenne Lucero FMW:[id number] DOB: 1961-11-01 DOA: 07/13/2024; DOS: 07/13/2024  PCP: Burney Darice CROME, MD *** Patient coming from: home ***  I have personally briefly reviewed patient's old medical records in [facility]  Chief Complaint: ***  HPI: Cheyenne Lucero is a 63 y.o. female with medical history significant for *** who is admitted to [facility] on 07/13/2024 with *** after presenting from home*** to [facility] ED complaining of ***.    ***       ***   ED Course:  Vital signs in the ED were notable for the following: ***  Labs were notable for the following: ***  Per my interpretation, EKG in ED demonstrated the following:  ***  Imaging in the ED, per corresponding formal radiology read, was notable for the following:  ***  While in the ED, the following were administered: ***  Subsequently, the patient was admitted  ***  ***red    Review of Systems: As per HPI otherwise 10 point review of systems negative.   Past Medical History:  Diagnosis Date   COPD (chronic obstructive pulmonary disease) (HCC)    Coronary artery disease 2004   heart event from a blood clot    Pulmonary embolus [facility])     Past Surgical History:  Procedure Laterality Date   LEFT HEART CATH      Social History:  reports that she has been smoking cigarettes. She has never used smokeless tobacco. She reports that she does not drink alcohol and does not use drugs.   Allergies  Allergen Reactions   Morphine And Codeine Anaphylaxis   Tramadol  Nausea And Vomiting    Family History  Problem Relation Age of Onset   Lupus Mother     Family history reviewed and not pertinent ***   Prior to Admission medications   Medication Sig Start Date End Date Taking? Authorizing Provider  amLODipine (NORVASC) 10 MG tablet Take 10 mg by mouth daily. 03/31/24  Yes [provider]  ezetimibe  (ZETIA ) 10 MG tablet Take 10 mg by mouth daily.  04/25/24  Yes [provider]  metoprolol  tartrate (LOPRESSOR ) 50 MG tablet Take 50 mg by mouth 2 (two) times daily. 04/25/24  Yes [provider]  pregabalin  (LYRICA ) 50 MG capsule Take 50 mg by mouth 3 (three) times daily. 04/25/24  Yes [provider]  acetaminophen  (TYLENOL ) 325 MG tablet Take 650 mg by mouth every 6 (six) hours as needed for mild pain.    [provider]  buPROPion  (WELLBUTRIN  SR) 150 MG 12 hr tablet Take 150 mg by mouth daily.    [provider]  Cholecalciferol  (VITAMIN D3) 2000 units TABS Take 1 tablet by mouth daily.    [provider]  cloNIDine  (CATAPRES ) 0.3 MG tablet Take 0.3 mg by mouth daily.    [provider]  EUTHYROX  100 MCG tablet Take 100 mcg by mouth every morning. 03/07/21   [provider]  Evolocumab (REPATHA) 140 MG/ML SOSY Inject into the skin.    [provider]  gabapentin (NEURONTIN) 300 MG capsule gabapentin 300 mg capsule    [provider]  hydrochlorothiazide (HYDRODIURIL) 25 MG tablet     [provider]  liothyronine  (CYTOMEL ) 5 MCG tablet liothyronine  5 mcg tablet    [provider]  losartan  (COZAAR ) 100 MG tablet Take 1 tablet by mouth daily. 03/13/21   [provider]  Metoprolol  Succinate 50 MG CS24  1 in am, 2 at [facility]    [provider]  mirtazapine  (REMERON ) 30 MG tablet Take 30 mg by mouth at bedtime.    [provider]     Objective    Physical Exam: Vitals:   07/13/24 1930 07/13/24 2015 07/13/24 2100 07/13/24 2210  BP: (!) 142/73   (!) 171/82  Pulse: 65 72 63 87  Resp: (!) 22 (!) 24 (!) 28 (!) 24  Temp:    98.6 F (37 C)  TempSrc:    Oral  SpO2: 95% 95% 91% 92%  Weight:    71.4 kg  Height:    5' 4 (1.626 m)    General: appears to be stated age; alert, oriented Skin: warm, dry, no rash Head:  AT/[place] Mouth:  Oral mucosa membranes appear moist, normal dentition Neck: supple; trachea  midline Heart:  RRR; did not appreciate any M/R/G Lungs: CTAB, did not appreciate any wheezes, rales, or rhonchi Abdomen: + BS; soft, ND, NT Vascular: 2+ pedal pulses b/l; 2+ radial pulses b/l Extremities: no peripheral edema, no muscle wasting Neuro: strength and sensation intact in upper and lower extremities b/l ***   *** Neuro: 5/5 strength of the proximal and distal flexors and extensors of the upper and lower extremities bilaterally; sensation intact in upper and lower extremities b/l; cranial nerves II through XII grossly intact; no pronator drift; no evidence suggestive of slurred speech, dysarthria, or facial droop; Normal muscle tone. No tremors.  *** Neuro: In the setting of the patient's current mental status and associated inability to follow instructions, unable to perform full neurologic exam at this time.  As such, assessment of strength, sensation, and cranial nerves is limited at this time. Patient noted to spontaneously move all 4 extremities. No tremors.  ***    Labs on Admission: I have personally reviewed following labs and imaging studies  CBC: Recent Labs  Lab 07/13/24 1257  WBC 14.1*  HGB 15.1*  HCT 43.9  MCV 97.8  PLT 385   Basic Metabolic Panel: Recent Labs  Lab 07/13/24 1257  NA 136  K 4.4  CL 97*  CO2 25  GLUCOSE 129*  BUN 39*  CREATININE 2.08*  CALCIUM  10.0   GFR: Estimated Creatinine Clearance: 26.8 mL/min (A) (by C-G formula based on SCr of 2.08 mg/dL (H)). Liver Function Tests: No results for input(s): AST, ALT, ALKPHOS, BILITOT, PROT, ALBUMIN  in the last 168 hours. No results for input(s): LIPASE, AMYLASE in the last 168 hours. No results for input(s): AMMONIA in the last 168 hours. Coagulation Profile: No results for input(s): INR, PROTIME in the last 168 hours. Cardiac Enzymes: No results for input(s): CKTOTAL, CKMB, CKMBINDEX, TROPONINI in the last 168 hours. BNP (last 3 results) No results for  input(s): PROBNP in the last 8760 hours. HbA1C: No results for input(s): HGBA1C in the last 72 hours. CBG: No results for input(s): GLUCAP in the last 168 hours. Lipid Profile: No results for input(s): CHOL, HDL, LDLCALC, TRIG, CHOLHDL, LDLDIRECT in the last 72 hours. Thyroid  Function Tests: No results for input(s): TSH, T4TOTAL, FREET4, T3FREE, THYROIDAB in the last 72 hours. Anemia Panel: No results for input(s): VITAMINB12, FOLATE, FERRITIN, TIBC, IRON, RETICCTPCT in the last 72 hours. Urine analysis: No results found for: COLORURINE, APPEARANCEUR, LABSPEC, PHURINE, GLUCOSEU, HGBUR, BILIRUBINUR, KETONESUR, PROTEINUR, UROBILINOGEN, NITRITE, LEUKOCYTESUR  Radiological Exams on Admission: CT CHEST ABDOMEN PELVIS WO CONTRAST Result Date: 07/13/2024 CLINICAL DATA:  Abdominal pain, acute, nonlocalized Acute kidney injury 4-day history of some cough  some exertional shortness of breath and chest pain across the upper left and right side of the chest. Shortness make is worse with the coughing. HX COPD EXAM: CT CHEST, ABDOMEN AND PELVIS WITHOUT CONTRAST TECHNIQUE: Multidetector CT imaging of the chest, abdomen and pelvis was performed following the standard protocol without IV contrast. RADIATION DOSE REDUCTION: This exam was performed according to the departmental dose-optimization program which includes automated exposure control, adjustment of the mA and/or kV according to patient size and/or use of iterative reconstruction technique. COMPARISON:  None Available. FINDINGS: CT CHEST FINDINGS Cardiovascular: Normal heart size. Small volume pericardial effusion. The thoracic aorta is normal in caliber. No atherosclerotic plaque of the thoracic aorta. No coronary artery calcifications. Mediastinum/Nodes: No enlarged mediastinal, hilar, or axillary lymph nodes. Thyroid  gland, trachea, and esophagus demonstrate no significant findings.  Lungs/Pleura: Moderate centrilobular emphysematous changes. Left lower lobe atelectasis. No pulmonary nodule. No pulmonary mass. Trace a small volume left pleural effusion. No pneumothorax. Musculoskeletal: No chest wall abnormality. No suspicious lytic or blastic osseous lesions. No acute displaced fracture. Dextroscoliosis of the thoracolumbar spine. CT ABDOMEN PELVIS FINDINGS Hepatobiliary: The hepatic parenchyma is diffusely hypodense compared to the splenic parenchyma consistent with fatty infiltration. No focal liver abnormality. No gallstones, gallbladder wall thickening, or pericholecystic fluid. No biliary dilatation. Pancreas: No focal lesion. Normal pancreatic contour. No surrounding inflammatory changes. No main pancreatic ductal dilatation. Spleen: Normal in size without focal abnormality. Adrenals/Urinary Tract: No adrenal nodule bilaterally. No nephrolithiasis and no hydronephrosis. Fluid density lesions likely represent simple renal cysts. Simple renal cysts, in the absence of clinically indicated signs/symptoms, require no independent follow-up. No ureterolithiasis or hydroureter. The urinary bladder is unremarkable. Stomach/Bowel: Stomach is within normal limits. No evidence of bowel wall thickening or dilatation. Stool throughout the majority of the colon. Colonic diverticulosis. Appendix appears normal. Vascular/Lymphatic: No abdominal aorta or iliac aneurysm. Mild atherosclerotic plaque of the aorta and its branches. No abdominal, pelvic, or inguinal lymphadenopathy. Reproductive: Uterus and bilateral adnexa are unremarkable. Other: No intraperitoneal free fluid. No intraperitoneal free gas. No organized fluid collection. Musculoskeletal: No abdominal wall hernia or abnormality. No suspicious lytic or blastic osseous lesions. No acute displaced fracture. Levoscoliosis of the lumbar spine centered at the L3-L4 level with associated degenerative changes. IMPRESSION: 1. Small volume pericardial  effusion. 2. Left lower lobe atelectasis with superimposed infection not excluded. Limited evaluation on this noncontrast study. 3. Trace too small volume left pleural effusion. 4. Hepatic steatosis. 5. Colonic diverticulosis with no acute diverticulitis. 6. Aortic Atherosclerosis (ICD10-I70.0) and Emphysema (ICD10-J43.9). Electronically Signed   By: Morgane  Naveau M.D.   On: 07/13/2024 15:49   DG Chest 2 View Result Date: 07/13/2024 CLINICAL DATA:  chest pain EXAM: CHEST - 2 VIEW COMPARISON:  February 23, 2018 FINDINGS: Small left pleural effusion with patchy left basilar airspace opacities, likely atelectasis. Mild cardiomegaly. Tortuous aorta with aortic atherosclerosis. Moderate dextrocurvature of the thoracic spine, unchanged. No acute fracture or destructive lesions. Multilevel thoracic osteophytosis. IMPRESSION: Small left pleural effusion. Patchy left basilar airspace opacities, which may represent atelectasis or a developing bronchopneumonia, in the correct clinical context. Electronically Signed   By: Rogelia Myers M.D.   On: 07/13/2024 14:27      Assessment/Plan   Principal Problem:   COPD exacerbation (HCC)   ***            ***                  ***                   ***                  ***                  ***                  ***                   ***                  ***                  ***                  ***                  ***                 ***                ***  DVT prophylaxis: SCD's ***  Code Status: Full code*** Family Communication: none*** Disposition Plan: Per Rounding Team Consults called: none***;  Admission status: ***     I SPENT GREATER THAN 75 *** MINUTES IN CLINICAL CARE TIME/MEDICAL DECISION-MAKING IN COMPLETING THIS ADMISSION.       Eva NOVAK Tyriek Hofman DO [facility]  From 7PM - 7AM   07/13/2024, 11:03 PM   ***

## 2024-07-13 NOTE — ED Triage Notes (Signed)
 Chest pain, tightness x 4 days Worse over last 4 days Worse at night Fatigued Joint pain Home covid test -  Hx copd

## 2024-07-14 ENCOUNTER — Inpatient Hospital Stay (HOSPITAL_COMMUNITY)

## 2024-07-14 ENCOUNTER — Encounter (HOSPITAL_COMMUNITY): Payer: Self-pay | Admitting: Internal Medicine

## 2024-07-14 DIAGNOSIS — R6520 Severe sepsis without septic shock: Secondary | ICD-10-CM | POA: Diagnosis present

## 2024-07-14 DIAGNOSIS — R7989 Other specified abnormal findings of blood chemistry: Secondary | ICD-10-CM | POA: Diagnosis present

## 2024-07-14 DIAGNOSIS — Z8679 Personal history of other diseases of the circulatory system: Secondary | ICD-10-CM

## 2024-07-14 DIAGNOSIS — J9601 Acute respiratory failure with hypoxia: Secondary | ICD-10-CM | POA: Diagnosis present

## 2024-07-14 DIAGNOSIS — R079 Chest pain, unspecified: Secondary | ICD-10-CM | POA: Diagnosis not present

## 2024-07-14 DIAGNOSIS — J441 Chronic obstructive pulmonary disease with (acute) exacerbation: Secondary | ICD-10-CM | POA: Diagnosis not present

## 2024-07-14 DIAGNOSIS — J189 Pneumonia, unspecified organism: Secondary | ICD-10-CM | POA: Diagnosis present

## 2024-07-14 DIAGNOSIS — R0789 Other chest pain: Secondary | ICD-10-CM | POA: Diagnosis present

## 2024-07-14 LAB — CBC WITH DIFFERENTIAL/PLATELET
Abs Granulocyte: 6.8 K/uL — ABNORMAL HIGH (ref 1.5–6.5)
Abs Immature Granulocytes: 0.06 K/uL (ref 0.00–0.07)
Basophils Absolute: 0.1 K/uL (ref 0.0–0.1)
Basophils Relative: 1 %
Eosinophils Absolute: 0.3 K/uL (ref 0.0–0.5)
Eosinophils Relative: 3 %
HCT: 41.7 % (ref 36.0–46.0)
Hemoglobin: 13.9 g/dL (ref 12.0–15.0)
Immature Granulocytes: 1 %
Lymphocytes Relative: 26 %
Lymphs Abs: 2.8 K/uL (ref 0.7–4.0)
MCH: 33.3 pg (ref 26.0–34.0)
MCHC: 33.3 g/dL (ref 30.0–36.0)
MCV: 99.8 fL (ref 80.0–100.0)
Monocytes Absolute: 0.7 K/uL (ref 0.1–1.0)
Monocytes Relative: 7 %
Neutro Abs: 6.8 K/uL (ref 1.7–7.7)
Neutrophils Relative %: 62 %
Platelets: 221 K/uL (ref 150–400)
RBC: 4.18 MIL/uL (ref 3.87–5.11)
RDW: 14.2 % (ref 11.5–15.5)
WBC: 10.8 K/uL — ABNORMAL HIGH (ref 4.0–10.5)
nRBC: 0 % (ref 0.0–0.2)

## 2024-07-14 LAB — BLOOD GAS, VENOUS
Acid-base deficit: 0.7 mmol/L (ref 0.0–2.0)
Bicarbonate: 24.2 mmol/L (ref 20.0–28.0)
Drawn by: 8168
O2 Saturation: 86.5 %
Patient temperature: 36.8
pCO2, Ven: 40 mmHg — ABNORMAL LOW (ref 44–60)
pH, Ven: 7.39 (ref 7.25–7.43)
pO2, Ven: 52 mmHg — ABNORMAL HIGH (ref 32–45)

## 2024-07-14 LAB — COMPREHENSIVE METABOLIC PANEL WITH GFR
ALT: 24 U/L (ref 0–44)
AST: 39 U/L (ref 15–41)
Albumin: 2.7 g/dL — ABNORMAL LOW (ref 3.5–5.0)
Alkaline Phosphatase: 80 U/L (ref 38–126)
Anion gap: 10 (ref 5–15)
BUN: 27 mg/dL — ABNORMAL HIGH (ref 8–23)
CO2: 20 mmol/L — ABNORMAL LOW (ref 22–32)
Calcium: 8.2 mg/dL — ABNORMAL LOW (ref 8.9–10.3)
Chloride: 103 mmol/L (ref 98–111)
Creatinine, Ser: 1.35 mg/dL — ABNORMAL HIGH (ref 0.44–1.00)
GFR, Estimated: 44 mL/min — ABNORMAL LOW (ref >60)
Glucose, Bld: 168 mg/dL — ABNORMAL HIGH (ref 70–99)
Potassium: 4 mmol/L (ref 3.5–5.1)
Sodium: 133 mmol/L — ABNORMAL LOW (ref 135–145)
Total Bilirubin: 1.2 mg/dL (ref 0.0–1.2)
Total Protein: 6.8 g/dL (ref 6.5–8.1)

## 2024-07-14 LAB — RESPIRATORY PANEL BY PCR

## 2024-07-14 LAB — MAGNESIUM: Magnesium: 2.1 mg/dL (ref 1.7–2.4)

## 2024-07-14 LAB — PHOSPHORUS: Phosphorus: 2.4 mg/dL — ABNORMAL LOW (ref 2.5–4.6)

## 2024-07-14 LAB — ECHOCARDIOGRAM COMPLETE
AR max vel: 2.45 cm2
AV Peak grad: 10.6 mmHg
Ao pk vel: 1.63 m/s
Area-P 1/2: 3.46 cm2
Height: 64 in
S' Lateral: 3.1 cm
Weight: 2491.2 [oz_av]

## 2024-07-14 LAB — LACTIC ACID, PLASMA: Lactic Acid, Venous: 1.1 mmol/L (ref 0.5–1.9)

## 2024-07-14 LAB — PROCALCITONIN: Procalcitonin: 0.36 ng/mL

## 2024-07-14 MED ORDER — LEVOTHYROXINE SODIUM 100 MCG PO TABS
100.0000 ug | ORAL_TABLET | Freq: Every day | ORAL | Status: DC
  Administered 2024-07-14 – 2024-07-17 (×4): 100 ug via ORAL
  Filled 2024-07-14 (×4): qty 1

## 2024-07-14 MED ORDER — LIOTHYRONINE SODIUM 5 MCG PO TABS: 5.0000 ug | ORAL_TABLET | Freq: Every day | ORAL | Status: DC

## 2024-07-14 MED ORDER — PREGABALIN 100 MG PO CAPS
100.0000 mg | ORAL_CAPSULE | Freq: Every day | ORAL | Status: DC
  Administered 2024-07-14 – 2024-07-16 (×3): 100 mg via ORAL
  Filled 2024-07-14 (×3): qty 1

## 2024-07-14 MED ORDER — ENOXAPARIN SODIUM 80 MG/0.8ML IJ SOSY: 70.0000 mg | PREFILLED_SYRINGE | INTRAMUSCULAR | Status: DC

## 2024-07-14 MED ORDER — CLONIDINE HCL 0.2 MG PO TABS
0.2000 mg | ORAL_TABLET | Freq: Every day | ORAL | Status: DC
Start: 2024-07-14 — End: 2024-07-17
  Administered 2024-07-14 – 2024-07-16 (×3): 0.2 mg via ORAL
  Filled 2024-07-14 (×3): qty 1

## 2024-07-14 MED ORDER — AZITHROMYCIN 250 MG PO TABS
500.0000 mg | ORAL_TABLET | Freq: Every day | ORAL | Status: AC
  Administered 2024-07-14 – 2024-07-17 (×4): 500 mg via ORAL
  Filled 2024-07-14 (×4): qty 2

## 2024-07-14 MED ORDER — IBUPROFEN 600 MG PO TABS: 800.0000 mg | ORAL_TABLET | Freq: Once | ORAL | Status: DC

## 2024-07-14 MED ORDER — HYDRALAZINE HCL 20 MG/ML IJ SOLN: 10.0000 mg | Freq: Four times a day (QID) | INTRAMUSCULAR | Status: DC | PRN

## 2024-07-14 MED ORDER — PREDNISONE 20 MG PO TABS
50.0000 mg | ORAL_TABLET | Freq: Every day | ORAL | Status: DC
  Administered 2024-07-15 – 2024-07-17 (×3): 50 mg via ORAL
  Filled 2024-07-14 (×3): qty 1

## 2024-07-14 MED ORDER — BUPROPION HCL ER (SR) 150 MG PO TB12
150.0000 mg | ORAL_TABLET | Freq: Every day | ORAL | Status: DC
  Administered 2024-07-14: 150 mg via ORAL
  Filled 2024-07-14 (×4): qty 1

## 2024-07-14 MED ORDER — METOPROLOL TARTRATE 25 MG PO TABS
25.0000 mg | ORAL_TABLET | Freq: Two times a day (BID) | ORAL | Status: DC
Start: 2024-07-14 — End: 2024-07-17
  Administered 2024-07-14 – 2024-07-17 (×7): 25 mg via ORAL
  Filled 2024-07-14 (×7): qty 1

## 2024-07-14 MED ORDER — EZETIMIBE 10 MG PO TABS
10.0000 mg | ORAL_TABLET | Freq: Every day | ORAL | Status: DC
  Administered 2024-07-14 – 2024-07-17 (×4): 10 mg via ORAL
  Filled 2024-07-14 (×4): qty 1

## 2024-07-14 MED ORDER — MIRTAZAPINE 15 MG PO TABS
30.0000 mg | ORAL_TABLET | Freq: Every day | ORAL | Status: DC
  Administered 2024-07-14 – 2024-07-16 (×3): 30 mg via ORAL
  Filled 2024-07-14 (×3): qty 2

## 2024-07-14 MED ORDER — ASPIRIN 81 MG PO TBEC
81.0000 mg | DELAYED_RELEASE_TABLET | Freq: Every day | ORAL | Status: DC
  Administered 2024-07-14 – 2024-07-17 (×4): 81 mg via ORAL
  Filled 2024-07-14 (×4): qty 1

## 2024-07-14 MED ORDER — OXYCODONE HCL 5 MG PO TABS
5.0000 mg | ORAL_TABLET | Freq: Four times a day (QID) | ORAL | Status: DC | PRN
  Administered 2024-07-14 (×2): 5 mg via ORAL
  Filled 2024-07-14 (×2): qty 1

## 2024-07-14 MED ORDER — ENOXAPARIN SODIUM 80 MG/0.8ML IJ SOSY
70.0000 mg | PREFILLED_SYRINGE | Freq: Two times a day (BID) | INTRAMUSCULAR | Status: DC
  Administered 2024-07-14 – 2024-07-15 (×3): 70 mg via SUBCUTANEOUS
  Filled 2024-07-14 (×3): qty 0.8

## 2024-07-14 NOTE — Progress Notes (Signed)
 PHARMACY - ANTICOAGULATION CONSULT NOTE  Pharmacy Consult for Lovenox   Indication: Rule out pulmonary embolus  Allergies  Allergen Reactions   Morphine And Codeine Anaphylaxis   Tramadol  Nausea And Vomiting    Patient Measurements: Height: 5' 4 (162.6 cm) Weight: 71.4 kg (157 lb 6.5 oz) IBW/kg (Calculated) : 54.7 HEPARIN DW (KG): 69.3  Vital Signs: Temp: 98.6 F (37 C) (08/06 2210) Temp Source: Oral (08/06 2210) BP: 171/82 (08/06 2210) Pulse Rate: 87 (08/06 2210)  Labs: Recent Labs    07/13/24 1257  HGB 15.1*  HCT 43.9  PLT 385  CREATININE 2.08*    Estimated Creatinine Clearance: 26.8 mL/min (A) (by C-G formula based on SCr of 2.08 mg/dL (H)).   Medical History: Past Medical History:  Diagnosis Date   COPD (chronic obstructive pulmonary disease) (HCC)    Coronary artery disease 2004   heart event from a blood clot    Pulmonary embolus (HCC)      Assessment: 63 y/o with chest tightness and elevated D-dimer. History of PE but no current anti-coagulation. Waiting on VQ scan with elevated Scr. Other labs above reviewed.   Goal of Therapy:  Monitor platelets by anticoagulation protocol: Yes   Plan:  Lovenox  70 mg subcutaneous q24h Daily CBC Monitor for bleeding F/U VQ scan  Lynwood Mckusick, PharmD, BCPS Clinical Pharmacist Phone: [phone]

## 2024-07-14 NOTE — Progress Notes (Signed)
 Echocardiogram 2D Echocardiogram has been performed.  Cheyenne Lucero 07/14/2024, 2:30 PM

## 2024-07-14 NOTE — Progress Notes (Addendum)
 PROGRESS NOTE    Cheyenne Lucero  FMW:[id number] DOB: June 08, 1961 DOA: 07/13/2024 PCP: Burney Darice CROME, MD   Brief Narrative:   Cheyenne Lucero is a 64 y.o. female with medical history significant for COPD, pulmonary embolism 2003 (not on anticoagulation anymore), acquired hypothyroidism, essential hypertension, who presented to ED with shortness of breath and iadmitted to [facility] on 07/13/2024 for major diagnosis of acute hypoxic respiratory secondary to acute COPD exacerbation, left lower lobe pneumonia and possible PE.  Assessment & Plan:   Principal Problem:   COPD exacerbation (HCC) Active Problems:   Acquired hypothyroidism   Acute hypoxic respiratory failure (HCC)   Severe sepsis (HCC)   CAP (community acquired pneumonia)   Elevated d-dimer   Atypical chest pain   Elevated troponin   Elevated serum creatinine   History of essential hypertension  Severe sepsis and acute hypoxic respiratory failure secondary to acute COPD exacerbation and left lower lobe pneumonia/?  PE, POA: Patient met criteria for severe sepsis based on tachypnea, leukocytosis and acute hypoxic respiratory failure.  CT chest without contrast raise concern for left lower lobe pneumonia.  With elevated white cells, tachypnea as well as elevated procalcitonin, this appears to be the right diagnosis.  Also came in with COPD exacerbation.  Elevated D-dimer but due to elevated creatinine, not a candidate for CTA, VQ scan ordered by admitting hospitalist and has been started on empiric Lovenox .  Patient feels slightly better, continue Rocephin  and Zithromax , bronchodilators.  Blood culture in process.  I will check sputum culture, urine antigen for Legionella and streptococci.  Check RVP.  Does not have wheezes, will switch from IV Solu-Medrol  to oral prednisone  50 mg p.o. daily for 4 more doses.  AKI on CKD stage IIIa: Baseline creatinine appears to be around 1.3-1.4.  Presented with 2.08 and now back to  baseline.  Elevated troponin: Slightly elevated and flat with no symptoms of ACS, suggestive of demand ischemia.  Monitor on telemetry.  Acquired hypothyroidism: Continue Synthroid .  Essential hypertension: outpatient antihypertensive regimen including losartan , amlodipine, HCTZ, oral clonidine .  All of them on hold and blood pressure completely stable.  Will monitor off of antihypertensives for now.  DVT prophylaxis: SCDs Start: 07/13/24 2240   Code Status: Full Code  Family Communication: Daughter present at bedside.  Plan of care discussed with patient in length and he/she verbalized understanding and agreed with it.  Status is: Inpatient Remains inpatient appropriate because: Still hypoxic and does not feel better.   Estimated body mass index is 26.73 kg/m as calculated from the following:   Height as of this encounter: 5' 4 (1.626 m).   Weight as of this encounter: 70.6 kg.    Nutritional Assessment: Body mass index is 26.73 kg/m.SABRA Seen by dietician.  I agree with the assessment and plan as outlined below: Nutrition Status:        . Skin Assessment: I have examined the patient's skin and I agree with the wound assessment as performed by the wound care RN as outlined below:    Consultants:  None  Procedures:  None  Antimicrobials:  Anti-infectives (From admission, onward)    Start     Dose/Rate Route Frequency Ordered Stop   07/14/24 1400  azithromycin  (ZITHROMAX ) 500 mg in sodium chloride  0.9 % 250 mL IVPB        500 mg 250 mL/hr over 60 Minutes Intravenous Every 24 hours 07/13/24 2250     07/14/24 1400  cefTRIAXone  (ROCEPHIN ) 1 g in  sodium chloride  0.9 % 100 mL IVPB        1 g 200 mL/hr over 30 Minutes Intravenous Every 24 hours 07/13/24 2250     07/13/24 1500  cefTRIAXone  (ROCEPHIN ) 1 g in sodium chloride  0.9 % 100 mL IVPB        1 g 200 mL/hr over 30 Minutes Intravenous  Once 07/13/24 1456 07/13/24 1625   07/13/24 1500  azithromycin  (ZITHROMAX ) 500 mg  in sodium chloride  0.9 % 250 mL IVPB        500 mg 250 mL/hr over 60 Minutes Intravenous  Once 07/13/24 1456 07/13/24 1907         Subjective: Patient seen and examined, daughter at the bedside.  She states that she does not feel any better than yesterday, continues to have cough and shortness of breath.  Appears comfortable.  No wheezes today.  Objective: Vitals:   07/13/24 2210 07/14/24 0024 07/14/24 0354 07/14/24 0813  BP: (!) 171/82 (!) 140/71 102/78 (!) 144/68  Pulse: 87 79  95  Resp: (!) 24 (!) 21 20 19   Temp: 98.6 F (37 C) 98.4 F (36.9 C) 98.9 F (37.2 C) 98.5 F (36.9 C)  TempSrc: Oral Oral Oral Oral  SpO2: 92% 94%  95%  Weight: 71.4 kg  70.6 kg   Height: 5' 4 (1.626 m)       Intake/Output Summary (Last 24 hours) at 07/14/2024 0830 Last data filed at 07/13/2024 1625 Gross per 24 hour  Intake 87.54 ml  Output --  Net 87.54 ml   Filed Weights   07/13/24 1500 07/13/24 2210 07/14/24 0354  Weight: 71 kg 71.4 kg 70.6 kg    Examination:  General exam: Appears calm and comfortable  Respiratory system: Clear to auscultation. Respiratory effort normal. Cardiovascular system: S1 & S2 heard, RRR. No JVD, murmurs, rubs, gallops or clicks. No pedal edema. Gastrointestinal system: Abdomen is nondistended, soft and nontender. No organomegaly or masses felt. Normal bowel sounds heard. Central nervous system: Alert and oriented. No focal neurological deficits. Extremities: Symmetric 5 x 5 power. Skin: No rashes, lesions or ulcers Psychiatry: Judgement and insight appear normal. Mood & affect appropriate.    Data Reviewed: I have personally reviewed following labs and imaging studies  CBC: Recent Labs  Lab 07/13/24 1257 07/14/24 0515  WBC 14.1* 10.8*  NEUTROABS  --  6.8  HGB 15.1* 13.9  HCT 43.9 41.7  MCV 97.8 99.8  PLT 385 221   Basic Metabolic Panel: Recent Labs  Lab 07/13/24 1257 07/14/24 0515  NA 136 133*  K 4.4 4.0  CL 97* 103  CO2 25 20*  GLUCOSE  129* 168*  BUN 39* 27*  CREATININE 2.08* 1.35*  CALCIUM 10.0 8.2*  MG  --  2.1  PHOS  --  2.4*   GFR: Estimated Creatinine Clearance: 41.1 mL/min (A) (by C-G formula based on SCr of 1.35 mg/dL (H)). Liver Function Tests: Recent Labs  Lab 07/14/24 0515  AST 39  ALT 24  ALKPHOS 80  BILITOT 1.2  PROT 6.8  ALBUMIN  2.7*   No results for input(s): LIPASE, AMYLASE in the last 168 hours. No results for input(s): AMMONIA in the last 168 hours. Coagulation Profile: No results for input(s): INR, PROTIME in the last 168 hours. Cardiac Enzymes: No results for input(s): CKTOTAL, CKMB, CKMBINDEX, TROPONINI in the last 168 hours. BNP (last 3 results) No results for input(s): PROBNP in the last 8760 hours. HbA1C: No results for input(s): HGBA1C in the last 72  hours. CBG: No results for input(s): GLUCAP in the last 168 hours. Lipid Profile: No results for input(s): CHOL, HDL, LDLCALC, TRIG, CHOLHDL, LDLDIRECT in the last 72 hours. Thyroid  Function Tests: No results for input(s): TSH, T4TOTAL, FREET4, T3FREE, THYROIDAB in the last 72 hours. Anemia Panel: No results for input(s): VITAMINB12, FOLATE, FERRITIN, TIBC, IRON, RETICCTPCT in the last 72 hours. Sepsis Labs: Recent Labs  Lab 07/14/24 0010 07/14/24 0515  PROCALCITON  --  0.36  LATICACIDVEN 1.1  --     Recent Results (from the past 240 hours)  Resp panel by RT-PCR (RSV, Flu A&B, Covid) Anterior Nasal Swab     Status: None   Collection Time: 07/13/24  2:03 PM   Specimen: Anterior Nasal Swab  Result Value Ref Range Status   SARS Coronavirus 2 by RT PCR NEGATIVE NEGATIVE Final    Comment: (NOTE) SARS-CoV-2 target nucleic acids are NOT DETECTED.  The SARS-CoV-2 RNA is generally detectable in upper respiratory specimens during the acute phase of infection. The lowest concentration of SARS-CoV-2 viral copies this assay can detect is 138 copies/mL. A negative result does  not preclude SARS-Cov-2 infection and should not be used as the sole basis for treatment or other patient management decisions. A negative result may occur with  improper specimen collection/handling, submission of specimen other than nasopharyngeal swab, presence of viral mutation(s) within the areas targeted by this assay, and inadequate number of viral copies(<138 copies/mL). A negative result must be combined with clinical observations, patient history, and epidemiological information. The expected result is Negative.  Fact Sheet for Patients:  BloggerCourse.com  Fact Sheet for Healthcare Providers:  SeriousBroker.it  This test is no t yet approved or cleared by the [place]  FDA and  has been authorized for detection and/or diagnosis of SARS-CoV-2 by FDA under an Emergency Use Authorization (EUA). This EUA will remain  in effect (meaning this test can be used) for the duration of the COVID-19 declaration under Section 564(b)(1) of the Act, 21 U.S.C.section 360bbb-3(b)(1), unless the authorization is terminated  or revoked sooner.       Influenza A by PCR NEGATIVE NEGATIVE Final   Influenza B by PCR NEGATIVE NEGATIVE Final    Comment: (NOTE) The Xpert Xpress SARS-CoV-2/FLU/RSV plus assay is intended as an aid in the diagnosis of influenza from Nasopharyngeal swab specimens and should not be used as a sole basis for treatment. Nasal washings and aspirates are unacceptable for Xpert Xpress SARS-CoV-2/FLU/RSV testing.  Fact Sheet for Patients: BloggerCourse.com  Fact Sheet for Healthcare Providers: SeriousBroker.it  This test is not yet approved or cleared by the [place]  FDA and has been authorized for detection and/or diagnosis of SARS-CoV-2 by FDA under an Emergency Use Authorization (EUA). This EUA will remain in effect (meaning this test can be used) for the  duration of the COVID-19 declaration under Section 564(b)(1) of the Act, 21 U.S.C. section 360bbb-3(b)(1), unless the authorization is terminated or revoked.     Resp Syncytial Virus by PCR NEGATIVE NEGATIVE Final    Comment: (NOTE) Fact Sheet for Patients: BloggerCourse.com  Fact Sheet for Healthcare Providers: SeriousBroker.it  This test is not yet approved or cleared by the [place]  FDA and has been authorized for detection and/or diagnosis of SARS-CoV-2 by FDA under an Emergency Use Authorization (EUA). This EUA will remain in effect (meaning this test can be used) for the duration of the COVID-19 declaration under Section 564(b)(1) of the Act, 21 U.S.C. section 360bbb-3(b)(1), unless the authorization is terminated or  revoked.  Performed at [facility], [address] [place], [place] [address]      Radiology Studies: CT CHEST ABDOMEN PELVIS WO CONTRAST Result Date: 07/13/2024 CLINICAL DATA:  Abdominal pain, acute, nonlocalized Acute kidney injury 4-day history of some cough some exertional shortness of breath and chest pain across the upper left and right side of the chest. Shortness make is worse with the coughing. HX COPD EXAM: CT CHEST, ABDOMEN AND PELVIS WITHOUT CONTRAST TECHNIQUE: Multidetector CT imaging of the chest, abdomen and pelvis was performed following the standard protocol without IV contrast. RADIATION DOSE REDUCTION: This exam was performed according to the departmental dose-optimization program which includes automated exposure control, adjustment of the mA and/or kV according to patient size and/or use of iterative reconstruction technique. COMPARISON:  None Available. FINDINGS: CT CHEST FINDINGS Cardiovascular: Normal heart size. Small volume pericardial effusion. The thoracic aorta is normal in caliber. No atherosclerotic plaque of the thoracic aorta. No coronary artery calcifications.  Mediastinum/Nodes: No enlarged mediastinal, hilar, or axillary lymph nodes. Thyroid  gland, trachea, and esophagus demonstrate no significant findings. Lungs/Pleura: Moderate centrilobular emphysematous changes. Left lower lobe atelectasis. No pulmonary nodule. No pulmonary mass. Trace a small volume left pleural effusion. No pneumothorax. Musculoskeletal: No chest wall abnormality. No suspicious lytic or blastic osseous lesions. No acute displaced fracture. Dextroscoliosis of the thoracolumbar spine. CT ABDOMEN PELVIS FINDINGS Hepatobiliary: The hepatic parenchyma is diffusely hypodense compared to the splenic parenchyma consistent with fatty infiltration. No focal liver abnormality. No gallstones, gallbladder wall thickening, or pericholecystic fluid. No biliary dilatation. Pancreas: No focal lesion. Normal pancreatic contour. No surrounding inflammatory changes. No main pancreatic ductal dilatation. Spleen: Normal in size without focal abnormality. Adrenals/Urinary Tract: No adrenal nodule bilaterally. No nephrolithiasis and no hydronephrosis. Fluid density lesions likely represent simple renal cysts. Simple renal cysts, in the absence of clinically indicated signs/symptoms, require no independent follow-up. No ureterolithiasis or hydroureter. The urinary bladder is unremarkable. Stomach/Bowel: Stomach is within normal limits. No evidence of bowel wall thickening or dilatation. Stool throughout the majority of the colon. Colonic diverticulosis. Appendix appears normal. Vascular/Lymphatic: No abdominal aorta or iliac aneurysm. Mild atherosclerotic plaque of the aorta and its branches. No abdominal, pelvic, or inguinal lymphadenopathy. Reproductive: Uterus and bilateral adnexa are unremarkable. Other: No intraperitoneal free fluid. No intraperitoneal free gas. No organized fluid collection. Musculoskeletal: No abdominal wall hernia or abnormality. No suspicious lytic or blastic osseous lesions. No acute displaced  fracture. Levoscoliosis of the lumbar spine centered at the L3-L4 level with associated degenerative changes. IMPRESSION: 1. Small volume pericardial effusion. 2. Left lower lobe atelectasis with superimposed infection not excluded. Limited evaluation on this noncontrast study. 3. Trace too small volume left pleural effusion. 4. Hepatic steatosis. 5. Colonic diverticulosis with no acute diverticulitis. 6. Aortic Atherosclerosis (ICD10-I70.0) and Emphysema (ICD10-J43.9). Electronically Signed   By: Morgane  Naveau M.D.   On: 07/13/2024 15:49   DG Chest 2 View Result Date: 07/13/2024 CLINICAL DATA:  chest pain EXAM: CHEST - 2 VIEW COMPARISON:  February 23, 2018 FINDINGS: Small left pleural effusion with patchy left basilar airspace opacities, likely atelectasis. Mild cardiomegaly. Tortuous aorta with aortic atherosclerosis. Moderate dextrocurvature of the thoracic spine, unchanged. No acute fracture or destructive lesions. Multilevel thoracic osteophytosis. IMPRESSION: Small left pleural effusion. Patchy left basilar airspace opacities, which may represent atelectasis or a developing bronchopneumonia, in the correct clinical context. Electronically Signed   By: Rogelia Myers M.D.   On: 07/13/2024 14:27    Scheduled Meds:  buPROPion   150 mg  Oral Daily   enoxaparin  (LOVENOX ) injection  70 mg Subcutaneous Q24H   ezetimibe   10 mg Oral Daily   guaiFENesin   600 mg Oral BID   ipratropium-albuterol   3 mL Nebulization Q6H   levothyroxine   100 mcg Oral Q0600   methylPREDNISolone  (SOLU-MEDROL ) injection  80 mg Intravenous Q12H   Continuous Infusions:  azithromycin      cefTRIAXone  (ROCEPHIN )  IV       LOS: 1 day   Fredia Skeeter, MD [facility]  07/14/2024, 8:30 AM   *Please note that this is a verbal dictation therefore any spelling or grammatical errors are due to the Dragon Medical One system interpretation.  Please page via Amion and do not message via secure chat for urgent patient care  matters. Secure chat can be used for non urgent patient care matters.  How to contact the [facility] Attending or Consulting provider 7A - 7P or covering provider during after hours 7P -7A, for this patient?  Check the care team in [facility] and look for a) attending/consulting [facility] provider listed and b) the [facility] team listed. Page or secure chat 7A-7P. Log into [email or web address] and use [facility]'s universal password to access. If you do not have the password, please contact the hospital operator. Locate the [facility] provider you are looking for under [facility] and page to a number that you can be directly reached. If you still have difficulty reaching the provider, please page the [facility] (Director on Call) for the Hospitalists listed on amion for assistance.

## 2024-07-14 NOTE — TOC Initial Note (Signed)
 Transition of Care [facility]) - Initial/Assessment Note    Patient Details  Name: Somaya Grassi MRN: [id number] Date of Birth: 08/28/1961  [facility]) CM/SW Contact:    Sudie Erminio Deems, RN Phone Number: 07/14/2024, 2:34 PM  Clinical Narrative:  Patient presented for shortness of breath-COPD exacerbation. PTA patient was from home with spouse. Patient states she does not use any DME in the home. Patient has insurance and PCP-gets to all appointments without any issues. Case Manager will continue to follow for additional needs as the patient progresses.                 Expected Discharge Plan: Home/Self Care Barriers to Discharge: Continued Medical Work up   Patient Goals and CMS Choice Patient states their goals for this hospitalization and ongoing recovery are:: Patient to return home once stable.   Expected Discharge Plan and Services In-house Referral: NA Discharge Planning Services: CM Consult Post Acute Care Choice: NA Living arrangements for the past 2 months: Apartment                   DME Agency: NA       HH Arranged: NA   Prior Living Arrangements/Services Living arrangements for the past 2 months: Apartment Lives with:: Spouse Patient language and need for interpreter reviewed:: Yes Do you feel safe going back to the place where you live?: Yes      Need for Family Participation in Patient Care: No (Comment) Care giver support system in place?: No (comment)   Criminal Activity/Legal Involvement Pertinent to Current Situation/Hospitalization: No - Comment as needed  Activities of Daily Living      Permission Sought/Granted Permission sought to share information with : Case Manager, Family Supports   Emotional Assessment Appearance:: Appears stated age Attitude/Demeanor/Rapport: Engaged Affect (typically observed): Appropriate Orientation: : Oriented to Self, Oriented to Place, Oriented to  Time, Oriented to Situation Alcohol /  Substance Use: Not Applicable Psych Involvement: No (comment)  Admission diagnosis:  Precordial pain [R07.2] COPD exacerbation (HCC) [J44.1] Acute kidney injury (HCC) [N17.9] Exertional shortness of breath [R06.02] Elevated d-dimer [R79.89] Pneumonia of left lower lobe due to infectious organism [J18.9] Patient Active Problem List   Diagnosis Date Noted   Acute hypoxic respiratory failure (HCC) 07/14/2024   Severe sepsis (HCC) 07/14/2024   CAP (community acquired pneumonia) 07/14/2024   Elevated d-dimer 07/14/2024   Atypical chest pain 07/14/2024   Elevated troponin 07/14/2024   Elevated serum creatinine 07/14/2024   History of essential hypertension 07/14/2024   COPD exacerbation (HCC) 07/13/2024   Anxiety state 02/09/2018   Essential hypertension 02/09/2018   Acquired hypothyroidism 02/06/2018   Hyperthyroidism 02/06/2018   Lumbar radiculopathy 11/19/2017   Other idiopathic scoliosis, thoracolumbar region 11/19/2017   PCP:  Burney Darice CROME, MD Pharmacy:   [facility] [address], [place] - [address] [place] [address] Phone: [phone] Fax: [phone]  [facility] [address] [place] [address] Phone: [phone] Fax: [phone]   Social Drivers of Health (SDOH) Social History: SDOH Screenings   Food Insecurity: No Food Insecurity (07/13/2024)  Housing: Unknown (07/13/2024)  Transportation Needs: No Transportation Needs (07/13/2024)  Utilities: Not At Risk (07/13/2024)  Financial Resource Strain: Low Risk  (06/15/2024)   Received from [facility]  Tobacco Use: Medium Risk (07/14/2024)   Readmission Risk Interventions     No data to display

## 2024-07-14 NOTE — Plan of Care (Signed)

## 2024-07-14 NOTE — Progress Notes (Signed)
 PHARMACY - ANTICOAGULATION CONSULT NOTE  Pharmacy Consult for Lovenox   Indication: Rule out pulmonary embolus  Allergies  Allergen Reactions   Morphine And Codeine Anaphylaxis   Tramadol  Nausea And Vomiting    Patient Measurements: Height: 5' 4 (162.6 cm) Weight: 70.6 kg (155 lb 11.2 oz) IBW/kg (Calculated) : 54.7 HEPARIN DW (KG): 69.3  Vital Signs: Temp: 98.5 F (36.9 C) (08/07 0813) Temp Source: Oral (08/07 0813) BP: 144/68 (08/07 0813) Pulse Rate: 95 (08/07 0813)  Labs: Recent Labs    07/13/24 1257 07/14/24 0515  HGB 15.1* 13.9  HCT 43.9 41.7  PLT 385 221  CREATININE 2.08* 1.35*    Estimated Creatinine Clearance: 41.1 mL/min (A) (by C-G formula based on SCr of 1.35 mg/dL (H)).   Medical History: Past Medical History:  Diagnosis Date   COPD (chronic obstructive pulmonary disease) (HCC)    Coronary artery disease 2004   heart event from a blood clot    Essential hypertension    Hypothyroidism (acquired)    Pulmonary embolus (HCC)    in Nov 2003 (after long-distance travel)     Assessment: 63 y/o with chest tightness and elevated D-dimer. History of PE but no current anti-coagulation. Waiting on VQ scan with elevated Scr. Other labs above reviewed.   Scr improved. Clcr now 41 m/min.  Goal of Therapy:  Monitor platelets by anticoagulation protocol: Yes   Plan:  Increase Lovenox  70 mg subcutaneous q12 hr Monitor at least weekly CBC/Scr, signs/symptoms of bleeding  F/U VQ scan  Jinnie Door, PharmD, BCPS, [facility] Clinical Pharmacist  Please check AMION for all [facility] Pharmacy phone numbers After 10:00 PM, call Main Pharmacy [phone]

## 2024-07-15 ENCOUNTER — Inpatient Hospital Stay (HOSPITAL_COMMUNITY)

## 2024-07-15 ENCOUNTER — Other Ambulatory Visit: Payer: Self-pay | Admitting: Cardiology

## 2024-07-15 DIAGNOSIS — J441 Chronic obstructive pulmonary disease with (acute) exacerbation: Secondary | ICD-10-CM | POA: Diagnosis not present

## 2024-07-15 DIAGNOSIS — I3139 Other pericardial effusion (noninflammatory): Secondary | ICD-10-CM

## 2024-07-15 DIAGNOSIS — I951 Orthostatic hypotension: Secondary | ICD-10-CM

## 2024-07-15 LAB — COMPREHENSIVE METABOLIC PANEL WITH GFR
ALT: 32 U/L (ref 0–44)
AST: 33 U/L (ref 15–41)
Albumin: 2.7 g/dL — ABNORMAL LOW (ref 3.5–5.0)
Alkaline Phosphatase: 95 U/L (ref 38–126)
Anion gap: 14 (ref 5–15)
BUN: 26 mg/dL — ABNORMAL HIGH (ref 8–23)
CO2: 24 mmol/L (ref 22–32)
Calcium: 9.5 mg/dL (ref 8.9–10.3)
Chloride: 103 mmol/L (ref 98–111)
Creatinine, Ser: 1.4 mg/dL — ABNORMAL HIGH (ref 0.44–1.00)
GFR, Estimated: 42 mL/min — ABNORMAL LOW (ref >60)
Glucose, Bld: 183 mg/dL — ABNORMAL HIGH (ref 70–99)
Potassium: 4 mmol/L (ref 3.5–5.1)
Sodium: 141 mmol/L (ref 135–145)
Total Bilirubin: 0.8 mg/dL (ref 0.0–1.2)
Total Protein: 7.1 g/dL (ref 6.5–8.1)

## 2024-07-15 LAB — CBC WITH DIFFERENTIAL/PLATELET
Abs Immature Granulocytes: 0.13 K/uL — ABNORMAL HIGH (ref 0.00–0.07)
Basophils Absolute: 0 K/uL (ref 0.0–0.1)
Basophils Relative: 0 %
Eosinophils Absolute: 0 K/uL (ref 0.0–0.5)
Eosinophils Relative: 0 %
HCT: 45.3 % (ref 36.0–46.0)
Hemoglobin: 15.8 g/dL — ABNORMAL HIGH (ref 12.0–15.0)
Immature Granulocytes: 1 %
Lymphocytes Relative: 15 %
Lymphs Abs: 2.3 K/uL (ref 0.7–4.0)
MCH: 33.1 pg (ref 26.0–34.0)
MCHC: 34.9 g/dL (ref 30.0–36.0)
MCV: 94.8 fL (ref 80.0–100.0)
Monocytes Absolute: 0.8 K/uL (ref 0.1–1.0)
Monocytes Relative: 6 %
Neutro Abs: 11.6 K/uL — ABNORMAL HIGH (ref 1.7–7.7)
Neutrophils Relative %: 78 %
Platelets: 434 K/uL — ABNORMAL HIGH (ref 150–400)
RBC: 4.78 MIL/uL (ref 3.87–5.11)
RDW: 13.8 % (ref 11.5–15.5)
WBC: 14.8 K/uL — ABNORMAL HIGH (ref 4.0–10.5)
nRBC: 0 % (ref 0.0–0.2)

## 2024-07-15 LAB — URINALYSIS, COMPLETE (UACMP) WITH MICROSCOPIC
Bilirubin Urine: NEGATIVE
Glucose, UA: NEGATIVE mg/dL
Hgb urine dipstick: NEGATIVE
Ketones, ur: 5 mg/dL — AB
Nitrite: NEGATIVE
Protein, ur: 100 mg/dL — AB
Specific Gravity, Urine: 1.026 (ref 1.005–1.030)
pH: 5 (ref 5.0–8.0)

## 2024-07-15 LAB — CREATININE, URINE, RANDOM: Creatinine, Urine: 196 mg/dL

## 2024-07-15 LAB — SEDIMENTATION RATE: Sed Rate: 111 mm/h — ABNORMAL HIGH (ref 0–22)

## 2024-07-15 LAB — SODIUM, URINE, RANDOM: Sodium, Ur: 72 mmol/L

## 2024-07-15 LAB — FOLATE: Folate: 8.2 ng/mL (ref >5.9)

## 2024-07-15 LAB — VITAMIN D 25 HYDROXY (VIT D DEFICIENCY, FRACTURES): Vit D, 25-Hydroxy: 75.33 ng/mL (ref 30–100)

## 2024-07-15 LAB — STREP PNEUMONIAE URINARY ANTIGEN: Strep Pneumo Urinary Antigen: NEGATIVE

## 2024-07-15 LAB — VITAMIN B12: Vitamin B-12: 118 pg/mL — ABNORMAL LOW (ref 180–914)

## 2024-07-15 LAB — PHOSPHORUS: Phosphorus: 2.3 mg/dL — ABNORMAL LOW (ref 2.5–4.6)

## 2024-07-15 LAB — C-REACTIVE PROTEIN: CRP: 13.4 mg/dL — ABNORMAL HIGH (ref <1.0)

## 2024-07-15 LAB — MAGNESIUM: Magnesium: 2.3 mg/dL (ref 1.7–2.4)

## 2024-07-15 MED ORDER — LOSARTAN POTASSIUM 50 MG PO TABS
50.0000 mg | ORAL_TABLET | Freq: Every day | ORAL | Status: DC
  Administered 2024-07-15 – 2024-07-17 (×3): 50 mg via ORAL
  Filled 2024-07-15 (×3): qty 1

## 2024-07-15 MED ORDER — VITAMIN B-12 1000 MCG PO TABS: 1000.0000 ug | ORAL_TABLET | Freq: Every day | ORAL | Status: DC

## 2024-07-15 MED ORDER — CYANOCOBALAMIN 1000 MCG/ML IJ SOLN
1000.0000 ug | Freq: Every day | INTRAMUSCULAR | Status: AC
  Administered 2024-07-15 – 2024-07-16 (×2): 1000 ug via INTRAMUSCULAR
  Filled 2024-07-15 (×2): qty 1

## 2024-07-15 MED ORDER — COLCHICINE 0.6 MG PO TABS
0.6000 mg | ORAL_TABLET | Freq: Every day | ORAL | Status: DC
  Administered 2024-07-15 – 2024-07-17 (×3): 0.6 mg via ORAL
  Filled 2024-07-15 (×3): qty 1

## 2024-07-15 MED ORDER — TECHNETIUM TO 99M ALBUMIN AGGREGATED
4.4000 | Freq: Once | INTRAVENOUS | Status: AC | PRN
  Administered 2024-07-15: 4.4 via INTRAVENOUS

## 2024-07-15 MED ORDER — K PHOS MONO-SOD PHOS DI & MONO 155-852-130 MG PO TABS
500.0000 mg | ORAL_TABLET | Freq: Four times a day (QID) | ORAL | Status: AC
  Administered 2024-07-15 (×2): 500 mg via ORAL
  Filled 2024-07-15 (×2): qty 2

## 2024-07-15 MED ORDER — ENOXAPARIN SODIUM 40 MG/0.4ML IJ SOSY
40.0000 mg | PREFILLED_SYRINGE | Freq: Every evening | INTRAMUSCULAR | Status: DC
  Administered 2024-07-16: 40 mg via SUBCUTANEOUS
  Filled 2024-07-15: qty 0.4

## 2024-07-15 NOTE — Plan of Care (Signed)

## 2024-07-15 NOTE — Progress Notes (Signed)
 [facility] Progress Note  Patient: Cheyenne Lucero    FMW:[id number]  DOA: 07/13/2024     Date of Service: the patient was seen and examined on 07/15/2024  Chief Complaint  Patient presents with   Chest Pain   Brief hospital course: Cheyenne Lucero is a 63 y.o. female with medical history significant for COPD, pulmonary embolism 2003 (not on anticoagulation anymore), acquired hypothyroidism, essential hypertension, who presented to ED with shortness of breath and iadmitted to [facility] on 07/13/2024 for major diagnosis of acute hypoxic respiratory secondary to acute COPD exacerbation, left lower lobe pneumonia and possible PE   Patient was admitted on 07/13/2024 and I started taking care of her on 07/15/2024.  Please review H&P and prior notes for details.  Further management as below.  Assessment and Plan:  # Severe sepsis secondary to pneumonia # Acute hypoxic respiratory failure secondary to community-acquired pneumonia Continue supplemental O2 inhalation and gradually wean off Continue ceftriaxone  and azithromycin  Patient will need follow-up with PCP to repeat chest x-ray after 4 weeks for resolution of pneumonia Continue prednisone  50 mg p.o. daily for 4 days Continue DuoNeb every 6 hourly   # Elevated troponin most likely demand ischemia Continue to monitor on telemetry Abnormal TTE, LVEF 60-65%, grade 1 diastolic dysfunction.  Pericardial effusion with fibrinous material. Recommended limited echo with contrast and cardiac MRI Cardiology was consulted for further recommendation  # Elevated D-dimer Prior history of PE in 2003, s/p DOAC, completed course VQ scan negative for PE S/p Lovenox  twice daily therapeutic dose given during hospital stay.  Which has been discontinued   # AKI on CKD stage IIIa Baseline sCr 1.3-1.4 sCr 2.08>>1.4, back to her baseline Continue to monitor renal functions and urine output  # Essential hypertension Home meds: Losartan   100, metoprolol  25 mg p.o. twice daily, clonidine  0.4 mg nightly She is not taking Amlodipine, Antihypertensive medications were held on admission due to sepsis BP improved, 8/7 resumed clonidine  0.2 mg p.o. nightly and metoprolol  25 mg p.o. twice daily 8/8 BP slightly elevated, resumed Losartan  at low-dose 50 mg p.o. daily with holding parameters   # Hypophosphatemia, Phos repleted Monitor electrolytes daily  # Vitamin B12 deficiency: B12 level 118, goal >400 Started vitamin B12 1000 mcg IM injection daily during hospital stay, followed by oral supplement.  Follow-up PCP to repeat vitamin B12 level after 3 to 6 months.  # History of neuropathy: could be secondary to vitamin B12 deficiency Continue Lyrica  100 mg nightly home dose  # Hypothyroid: continue Synthroid   # Depression: Continue Wellbutrin  and Remeron  Home meds   Body mass index is 26.43 kg/m.  Interventions:  Diet: Regular diet DVT Prophylaxis: Subcutaneous Lovenox    Advance goals of care discussion: Full code  Family Communication: family was present at bedside, at the time of interview.  The pt provided permission to discuss medical plan with the family. Opportunity was given to ask question and all questions were answered satisfactorily.   Disposition:  Pt is from home, admitted with respiratory failure due to pneumonia, abnormal TTE, still has shortness of breath and on IV antibiotics, which precludes a safe discharge. Discharge to home, when stable and cleared by cardiology.  Subjective: No significant overnight events, patient feels improvement in the shortness of breath, still has mild cough which is dry, no phlegm.  No chest pain, denied any other complaints.  Physical Exam: General: NAD, lying comfortably Appear in no distress, affect appropriate Eyes: PERRLA ENT: Oral Mucosa Clear, moist  Neck: no JVD,  Cardiovascular: S1 and S2 Present, no Murmur,  Respiratory: Equal air entry bilaterally, mild  crackles, no wheezes  Abdomen: Bowel Sound present, Soft and no tenderness,  Skin: no rashes Extremities: no Pedal edema, no calf tenderness Neurologic: without any new focal findings Gait not checked due to patient safety concerns  Vitals:   07/15/24 0526 07/15/24 0807 07/15/24 0925 07/15/24 1127  BP:  (!) 163/77  (!) 148/76  Pulse:  77    Resp: 18 19 15 19   Temp: 97.6 F (36.4 C) 98.8 F (37.1 C)  97.8 F (36.6 C)  TempSrc: Oral Oral  Oral  SpO2:  92% 93% 90%  Weight: 69.9 kg     Height:        Intake/Output Summary (Last 24 hours) at 07/15/2024 1437 Last data filed at 07/15/2024 9188 Gross per 24 hour  Intake 580 ml  Output 400 ml  Net 180 ml   Filed Weights   07/13/24 2210 07/14/24 0354 07/15/24 0526  Weight: 71.4 kg 70.6 kg 69.9 kg    Data Reviewed: I have personally reviewed and interpreted daily labs, tele strips, imagings as discussed above. I reviewed all nursing notes, pharmacy notes, vitals, pertinent old records I have discussed plan of care as described above with RN and patient/family.  CBC: Recent Labs  Lab 07/13/24 1257 07/14/24 0515 07/15/24 0502  WBC 14.1* 10.8* 14.8*  NEUTROABS  --  6.8 11.6*  HGB 15.1* 13.9 15.8*  HCT 43.9 41.7 45.3  MCV 97.8 99.8 94.8  PLT 385 221 434*   Basic Metabolic Panel: Recent Labs  Lab 07/13/24 1257 07/14/24 0515 07/15/24 0502 07/15/24 1019  NA 136 133* 141  --   K 4.4 4.0 4.0  --   CL 97* 103 103  --   CO2 25 20* 24  --   GLUCOSE 129* 168* 183*  --   BUN 39* 27* 26*  --   CREATININE 2.08* 1.35* 1.40*  --   CALCIUM 10.0 8.2* 9.5  --   MG  --  2.1  --  2.3  PHOS  --  2.4*  --  2.3*    Studies: DG CHEST PORT 1 VIEW Result Date: 07/15/2024 CLINICAL DATA:  Possible community-acquired pneumonia. EXAM: PORTABLE CHEST 1 VIEW COMPARISON:  07/13/2024 FINDINGS: Lungs are adequately inflated demonstrate persistent left base/retrocardiac opacification likely slightly worse. Mild blunting of the left costophrenic  angle which may represent a small amount of pleural fluid. Cardiomediastinal silhouette and remainder of the exam is unchanged. IMPRESSION: Left base/retrocardiac opacification likely slightly worse and may be due to infection versus atelectasis. Possible small amount of left pleural fluid. Electronically Signed   By: Toribio Agreste M.D.   On: 07/15/2024 09:46    Scheduled Meds:  aspirin  EC  81 mg Oral Daily   azithromycin   500 mg Oral Daily   buPROPion   150 mg Oral Daily   cloNIDine   0.2 mg Oral QHS   cyanocobalamin   1,000 mcg Intramuscular Q1200   Followed by   NOREEN ON 07/17/2024] vitamin B-12  1,000 mcg Oral Daily   enoxaparin  (LOVENOX ) injection  70 mg Subcutaneous BID   ezetimibe   10 mg Oral Daily   guaiFENesin   600 mg Oral BID   ibuprofen   800 mg Oral Once   ipratropium-albuterol   3 mL Nebulization Q6H   levothyroxine   100 mcg Oral Q0600   metoprolol  tartrate  25 mg Oral BID   mirtazapine   30 mg Oral QHS  phosphorus  500 mg Oral QID   predniSONE   50 mg Oral Q breakfast   pregabalin   100 mg Oral QHS   Continuous Infusions:  cefTRIAXone  (ROCEPHIN )  IV 1 g (07/14/24 1337)   PRN Meds: acetaminophen  **OR** acetaminophen , albuterol , benzonatate , fentaNYL  (SUBLIMAZE ) injection, hydrALAZINE , melatonin, naLOXone  (NARCAN )  injection, ondansetron  (ZOFRAN ) IV, oxyCODONE   Time spent: 55 minutes  Author: ELVAN SOR. MD [facility] 07/15/2024 2:37 PM  To reach On-call, see care teams to locate the attending and reach out to them via [email or web address]. If 7PM-7AM, please contact night-coverage If you still have difficulty reaching the attending provider, please page the [facility] (Director on Call) for [facility] on amion for assistance.

## 2024-07-15 NOTE — Consult Note (Addendum)
 Cardiology Consultation   Patient ID: Cheyenne Lucero MRN: [id number]; DOB: 10/04/1961  Admit date: 07/13/2024 Date of Consult: 07/15/2024  PCP:  Cheyenne Darice CROME, MD   [facility] Providers Cardiologist:  new  Patient Profile: Cheyenne Lucero is a 63 y.o. female with a hx of COPD, history of PE (2003), hypothyroidism 2/2 treatment of Graves disease, hypertension, polycythemia secondary to OSA, who is being seen 07/15/2024 for the evaluation of chest pain, elevated troponin, abnormal echo finding at the request of Dr. Von.  History of Present Illness: Ms. Cheyenne Lucero has past medical history as stated above. She presented to [facility] ED on 07/13/2024 with chest pain, shortness of breath. She reported shortness of breath x 4 days with nonproductive cough. She reported associated intermittent sharp left lateral chest wall discomfort with deep inspiration. She denies any exertional element to her symptoms. She denies orthopnea, LE edema, PND, palpitations, diaphoresis, dizziness, presyncope.   Relevant workup in the ED/since admission includes: creatinine 1.39 > 2.08 > 1.35 > 1.40, CBC shows leukocytosis with WBC 14.8, respiratory panel negative, procal negative, troponin 34 > 29. CXR showed infection vs atelectasis, possible small amount of left pleural fluid. CT chest showed small pericardial effusion, LLL atelectasis with superimposed infection. VQ scan pending.   Echocardiogram showed LVEF 60-65%, G1DD, normal RV function, small pericardial effusion containing fibrous material. Recommendations for follow up cardiac MRI were made.   She has been diagnosed with severe sepsis and acute hypoxic respiratory failure due to PNA. She underwent echocardiogram, results as above, and cardiology was asked to consult for further recommendations.   Patient has never been seen by outpatient cardiology. Her history includes hypertension that she reports always having struggled to control. Her  current home regimen is: clonidine  0.2 mg daily, Lopressor  25 mg BID. She has been started on clonidine  0.2 mg daily, losartan  50 mg daily, Lopressor  25 mg BID while she has been inpatient.   After speaking with the patient, she tells me that her main complaint was this pleuritic chest pain. She tells me that her mother dealt with lupus. Since being admitted, she has had almost full resolution of her pain. She has been doing well with her incentive spirometer. She is well educated in regards to her medical history and she informs me of her echocardiogram findings. She is currently on oxygen, 2 L via [place], but this is not something she typically requires at home.   Past Medical History:  Diagnosis Date   COPD (chronic obstructive pulmonary disease) (HCC)    Coronary artery disease 2004   heart event from a blood clot    Essential hypertension    Hypothyroidism (acquired)    Pulmonary embolus (HCC)    in Nov 2003 (after long-distance travel)   Past Surgical History:  Procedure Laterality Date   LEFT HEART CATH      Home Medications:  Prior to Admission medications   Medication Sig Start Date End Date Taking? Authorizing Provider  acetaminophen  (TYLENOL ) 325 MG tablet Take 650 mg by mouth every 6 (six) hours as needed for mild pain.   Yes [provider]  aspirin  EC 81 MG tablet Take 81 mg by mouth daily. Swallow whole.   Yes [provider]  buPROPion  (WELLBUTRIN  XL) 150 MG 24 hr tablet Take 150 mg by mouth daily.   Yes [provider]  Cholecalciferol  (VITAMIN D3) 2000 units TABS Take 1 tablet by mouth daily.   Yes [provider]  cloNIDine  (CATAPRES ) 0.2  MG tablet Take 0.4 mg by mouth at bedtime.   Yes [provider]  ezetimibe  (ZETIA ) 10 MG tablet Take 10 mg by mouth daily. 04/25/24  Yes [provider]  levothyroxine  (SYNTHROID ) 100 MCG tablet Take 100 mcg by mouth daily before breakfast.   Yes [provider]  losartan   (COZAAR ) 100 MG tablet Take 1 tablet by mouth daily. 03/13/21  Yes [provider]  metoprolol  tartrate (LOPRESSOR ) 50 MG tablet Take 25 mg by mouth 2 (two) times daily. 04/25/24  Yes [provider]  mirtazapine  (REMERON ) 30 MG tablet Take 30 mg by mouth at bedtime.   Yes [provider]  pregabalin  (LYRICA ) 50 MG capsule Take 100 mg by mouth at bedtime. 04/25/24  Yes [provider]  amLODipine (NORVASC) 10 MG tablet Take 10 mg by mouth daily. Patient not taking: Reported on 07/14/2024 03/31/24   [provider]   Scheduled Meds:  aspirin  EC  81 mg Oral Daily   azithromycin   500 mg Oral Daily   buPROPion   150 mg Oral Daily   cloNIDine   0.2 mg Oral QHS   cyanocobalamin   1,000 mcg Intramuscular Q1200   Followed by   NOREEN ON 07/17/2024] vitamin B-12  1,000 mcg Oral Daily   enoxaparin  (LOVENOX ) injection  70 mg Subcutaneous BID   ezetimibe   10 mg Oral Daily   guaiFENesin   600 mg Oral BID   ipratropium-albuterol   3 mL Nebulization Q6H   levothyroxine   100 mcg Oral Q0600   losartan   50 mg Oral Daily   metoprolol  tartrate  25 mg Oral BID   mirtazapine   30 mg Oral QHS   phosphorus  500 mg Oral QID   predniSONE   50 mg Oral Q breakfast   pregabalin   100 mg Oral QHS   Continuous Infusions:  cefTRIAXone  (ROCEPHIN )  IV 1 g (07/15/24 1553)   PRN Meds: acetaminophen  **OR** acetaminophen , albuterol , benzonatate , fentaNYL  (SUBLIMAZE ) injection, hydrALAZINE , melatonin, naLOXone  (NARCAN )  injection, ondansetron  (ZOFRAN ) IV, oxyCODONE   Allergies:    Allergies  Allergen Reactions   Morphine And Codeine Anaphylaxis   Tramadol  Nausea And Vomiting   Social History:   Social History   Socioeconomic History   Marital status: Media planner    Spouse name: Not on file   Number of children: Not on file   Years of education: Not on file   Highest education level: Not on file  Occupational History   Not on file  Tobacco Use   Smoking status: Former     Types: Cigarettes   Smokeless tobacco: Never   Tobacco comments:    Pt states she is ready to quit smoking  Vaping Use   Vaping status: Never Used  Substance and Sexual Activity   Alcohol use: No   Drug use: No   Sexual activity: Not on file  Other Topics Concern   Not on file  Social History Narrative   Not on file   Social Drivers of Health   Financial Resource Strain: Low Risk  (06/15/2024)   Received from [facility]   Overall Financial Resource Strain (CARDIA)    Difficulty of Paying Living Expenses: Not hard at all  Food Insecurity: No Food Insecurity (07/13/2024)   Hunger Vital Sign    Worried About Running Out of Food in the Last Year: Never true    Ran Out of Food in the Last Year: Never true  Transportation Needs: No Transportation Needs (07/13/2024)   PRAPARE - Transportation  Lack of Transportation (Medical): No    Lack of Transportation (Non-Medical): No  Physical Activity: Not on file  Stress: Not on file  Social Connections: Not on file  Intimate Partner Violence: Not At Risk (07/14/2024)   Humiliation, Afraid, Rape, and Kick questionnaire    Fear of Current or Ex-Partner: No    Emotionally Abused: No    Physically Abused: No    Sexually Abused: No    Family History:   Family History  Problem Relation Age of Onset   Lupus Mother     ROS:  Please see the history of present illness.  All other ROS reviewed and negative.     Physical Exam/Data: Vitals:   07/15/24 0807 07/15/24 0925 07/15/24 1127 07/15/24 1639  BP: (!) 163/77  (!) 148/76 (!) 173/77  Pulse: 77   66  Resp: 19 15 19 18   Temp: 98.8 F (37.1 C)  97.8 F (36.6 C) 98.7 F (37.1 C)  TempSrc: Oral  Oral Oral  SpO2: 92% 93% 90% 92%  Weight:      Height:       Intake/Output Summary (Last 24 hours) at 07/15/2024 1703 Last data filed at 07/15/2024 9188 Gross per 24 hour  Intake 360 ml  Output 400 ml  Net -40 ml      07/15/2024    5:26 AM 07/14/2024    3:54 AM 07/13/2024   10:10 PM  Last 3  Weights  Weight (lbs) 154 lb 155 lb 11.2 oz 157 lb 6.5 oz  Weight (kg) 69.854 kg 70.625 kg 71.4 kg     Body mass index is 26.43 kg/m.   General:   in no acute distress, on 2 L oxygen via [place] HEENT: normal Neck: no JVD Vascular: Distal pulses 2+ bilaterally Cardiac:  normal S1, S2; RRR; no murmur  Lungs:  decreased breath sounds in bases, R > L  Abd: soft, nontender, no hepatomegaly  Ext: no edema Musculoskeletal:  No deformities, BUE and BLE strength normal and equal Skin: warm and dry  Neuro: no focal abnormalities noted Psych:  Normal affect   EKG:  The EKG was personally reviewed and demonstrates:  sinus rhythm, HR 60  Telemetry:  Telemetry was personally reviewed and demonstrates:  sinus rhythm, HR 70s  Relevant CV Studies:  Echocardiogram, 07/14/2024 Left ventricular ejection fraction, by estimation, is 60 to 65% . The left ventricle has normal function. Left ventricular endocardial border not optimally defined to evaluate regional wall motion. Left ventricular diastolic parameters are consistent with Grade I diastolic dysfunction ( impaired relaxation) .  Right ventricular systolic function is normal. The right ventricular size is normal.  The mitral valve is normal in structure. No evidence of mitral valve regurgitation. No evidence of mitral stenosis.  The aortic valve is tricuspid. Aortic valve regurgitation is trivial. Aortic valve sclerosis/ calcification is present, without any evidence of aortic stenosis. Aortic valve Vmax measures 1. 63 m/ s.  A small pericardial effusion is present. The pericardial effusion is circumferential. The pericardial effusion appears to contain fibrous material. Recommend repeat limited study with definity contrast for further evaluation of wall motion. Consider cardiac MRI for further visualization of the pericardium  Laboratory Data: High Sensitivity Troponin:  No results for input(s): TROPONINIHS in the last 720 hours.   Chemistry Recent  Labs  Lab 07/13/24 1257 07/14/24 0515 07/15/24 0502 07/15/24 1019  NA 136 133* 141  --   K 4.4 4.0 4.0  --   CL 97* 103 103  --  CO2 25 20* 24  --   GLUCOSE 129* 168* 183*  --   BUN 39* 27* 26*  --   CREATININE 2.08* 1.35* 1.40*  --   CALCIUM 10.0 8.2* 9.5  --   MG  --  2.1  --  2.3  GFRNONAA 26* 44* 42*  --   ANIONGAP 15 10 14   --     Recent Labs  Lab 07/14/24 0515 07/15/24 0502  PROT 6.8 7.1  ALBUMIN  2.7* 2.7*  AST 39 33  ALT 24 32  ALKPHOS 80 95  BILITOT 1.2 0.8   Lipids No results for input(s): CHOL, TRIG, HDL, LABVLDL, LDLCALC, CHOLHDL in the last 168 hours.  Hematology Recent Labs  Lab 07/13/24 1257 07/14/24 0515 07/15/24 0502  WBC 14.1* 10.8* 14.8*  RBC 4.49 4.18 4.78  HGB 15.1* 13.9 15.8*  HCT 43.9 41.7 45.3  MCV 97.8 99.8 94.8  MCH 33.6 33.3 33.1  MCHC 34.4 33.3 34.9  RDW 14.1 14.2 13.8  PLT 385 221 434*   Thyroid  No results for input(s): TSH, FREET4 in the last 168 hours.  BNPNo results for input(s): BNP, PROBNP in the last 168 hours.  DDimer  Recent Labs  Lab 07/13/24 1300  DDIMER 3.89*   Radiology/Studies:  NM Pulmonary Perfusion Result Date: 07/15/2024 EXAM: NM Lung Perfusion Scan. CLINICAL HISTORY: Pulmonary embolism (PE) suspected, high prob; sob, pleuritic chest pain, elevated d-dimer; remote h/o PE. Pt has been experiencing SOB that has been ongoing since Sunday. She quit smoking 6 years ago and has a hx of PE and DVT. TECHNIQUE: Radiolabeled MAA was administered intravenously and planar images of the lungs were obtained in multiple projections. RADIOPHARMACEUTICAL: 4.4 millicurie (technetium albumin aggregated (MAA) injection solution 4.4 millicurie TECHNETIUM TO 99M ALBUMIN AGGREGATED) COMPARISON: Chest radiograph from 07/15/2024. FINDINGS: PERFUSION: No segmental perfusion defects identified. Mild non-segmental decreased radiotracer activity is identified within the apical segment of the right upper lobe. IMPRESSION: 1.  No segmental perfusion defects to indicate pulmonary emboli. 2. Mild non-segmental decreased radiotracer activity within the apical segment of the right upper lobe. 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 Electronically signed by: Taylor Stroud MD 07/15/2024 04:33 PM EDT RP Workstation: [id number]   DG CHEST PORT 1 VIEW Result Date: 07/15/2024 CLINICAL DATA:  Possible community-acquired pneumonia. EXAM: PORTABLE CHEST 1 VIEW COMPARISON:  07/13/2024 FINDINGS: Lungs are adequately inflated demonstrate persistent left base/retrocardiac opacification likely slightly worse. Mild blunting of the left costophrenic angle which may represent a small amount of pleural fluid. Cardiomediastinal silhouette and remainder of the exam is unchanged. IMPRESSION: Left base/retrocardiac opacification likely slightly worse and may be due to infection versus atelectasis. Possible small amount of left pleural fluid. Electronically Signed   By: Daniel  Boyle M.D.   On: 07/15/2024 09:46   ECHOCARDIOGRAM COMPLETE Result Date: 07/14/2024 C     ECHOCARDIOGRAM REPORT   Patient Name:   Cheyenne Lucero Date of Exam: 07/14/2024 Medical Rec #:  [id number]          Height:       64.0 in Accession #:    [id number]         Weight:       15 5.7 lb Date of Birth:  01-22-1961         BSA:          1.759 m Patient Age:    21 years  BP:           144/68 mmHg Patient Gender: F                  HR:           87 bpm. Exam Location:  Inpatient Procedure: 2D Echo, Cardiac Doppler and Color Doppler (Both Spectral and Color            Flow Doppler were utilized during procedure). Indications:    Chest Pain R07.9  History:        Patient has prior history of Echocardiogram examinations, most                 recent 09/02/2019. COPD, Signs/Symptoms:Chest Pain; Risk                 Factors:Hypertension.  Sonographer:    Thea Norlander RCS Referring Phys: [id number]  JUSTIN B HOWERTER IMPRESSIONS  1. Left ventricular ejection fraction, by estimation, is 60 to 65%. The left ventricle has normal function. Left ventricular endocardial border not optimally defined to evaluate regional wall motion. Left ventricular diastolic parameters are consistent with Grade I diastolic dysfunction (impaired relaxation).  2. Right ventricular systolic function is normal. The right ventricular size is normal.  3. The mitral valve is normal in structure. No evidence of mitral valve regurgitation. No evidence of mitral stenosis.  4. The aortic valve is tricuspid. Aortic valve regurgitation is trivial. Aortic valve sclerosis/calcification is present, without any evidence of aortic stenosis. Aortic valve Vmax measures 1.63 m/s.  5. A small pericardial effusion is present. The pericardial effusion is circumferential. The pericardial effusion appears to contain fibrous material.  6. Recommend repeat limited study with definity contrast for further evaluation of wall motion. Consider cardiac MRI for further visualization of the pericardium FINDINGS  Left Ventricle: Left ventricular ejection fraction, by estimation, is 60 to 65%. The left ventricle has normal function. Left ventricular endocardial border not optimally defined to evaluate regional wall motion. The left ventricular internal cavity size was normal in size. There is no left ventricular hypertrophy. Left ventricular diastolic parameters are consistent with Grade I diastolic dysfunction (impaired relaxation). Normal left ventricular filling pressure. Right Ventricle: The right ventricular size is normal. No increase in right ventricular wall thickness. Right ventricular systolic function is normal. Left Atrium: Left atrial size was normal in size. Right Atrium: Right atrial size was normal in size. Pericardium: A small pericardial effusion is present. The pericardial effusion is circumferential. The pericardial effusion appears to contain  fibrous material. Mitral Valve: The mitral valve is normal in structure. No evidence of mitral valve regurgitation. No evidence of mitral valve stenosis. Tricuspid Valve: The tricuspid valve is normal in structure. Tricuspid valve regurgitation is not demonstrated. No evidence of tricuspid stenosis. Aortic Valve: The aortic valve is tricuspid. Aortic valve regurgitation is trivial. Aortic valve sclerosis/calcification is present, without any evidence of aortic stenosis. Aortic valve peak gradient measures 10.6 mmHg. Pulmonic Valve: The pulmonic valve was normal in structure. Pulmonic valve regurgitation is not visualized. No evidence of pulmonic stenosis. Aorta: The aortic root is normal in size and structure. Venous: The inferior vena cava was not well visualized. IAS/Shunts: No atrial level shunt detected by color flow Doppler.  LEFT VENTRICLE PLAX 2D LVIDd:         4.40 cm   Diastology LVIDs:         3.10 cm   LV e' medial:    9.45 cm/s LV PW:  1.10 cm   LV E/e' medial:  6.3 LV IVS:        0.80 cm   LV e' lateral:   13.30 cm/s LVOT diam:     2.10 cm   LV E/e' lateral: 4.5 LV SV:         69 LV SV Index:   39 LVOT Area:     3.46 cm  RIGHT VENTRICLE RV S prime:     16.70 cm/s LEFT ATRIUM           Index LA diam:      3.50 cm 1.99 cm/m LA Vol (A4C): 37.7 ml 21.43 ml/m  AORTIC VALVE AV Area (Vmax): 2.45 cm AV Vmax:        163.00 cm/s AV Peak Grad:   10.6 mmHg LVOT Vmax:      115.50 cm/s LVOT Vmean:     78.350 cm/s LVOT VTI:       0.198 m  AORTA Ao Root diam: 3.40 cm Ao Asc diam:  3.50 cm MITRAL VALVE MV Area (PHT): 3.46 cm    SHUNTS MV Decel Time: 219 msec    Systemic VTI:  0.20 m MV E velocity: 59.70 cm/s  Systemic Diam: 2.10 cm MV A velocity: 83.30 cm/s MV E/A ratio:  0.72 Wilbert Bihari MD Electronically signed by Wilbert Bihari MD Signature Date/Time: 07/14/2024/5:11:12 PM    Final    CT CHEST ABDOMEN PELVIS WO CONTRAST Result Date: 07/13/2024 CLINICAL DATA:  Abdominal pain, acute, nonlocalized Acute  kidney injury 4-day history of some cough some exertional shortness of breath and chest pain across the upper left and right side of the chest. Shortness make is worse with the coughing. HX COPD EXAM: CT CHEST, ABDOMEN AND PELVIS WITHOUT CONTRAST TECHNIQUE: Multidetector CT imaging of the chest, abdomen and pelvis was performed following the standard protocol without IV contrast. RADIATION DOSE REDUCTION: This exam was performed according to the departmental dose-optimization program which includes automated exposure control, adjustment of the mA and/or kV according to patient size and/or use of iterative reconstruction technique. COMPARISON:  None Available. FINDINGS: CT CHEST FINDINGS Cardiovascular: Normal heart size. Small volume pericardial effusion. The thoracic aorta is normal in caliber. No atherosclerotic plaque of the thoracic aorta. No coronary artery calcifications. Mediastinum/Nodes: No enlarged mediastinal, hilar, or axillary lymph nodes. Thyroid  gland, trachea, and esophagus demonstrate no significant findings. Lungs/Pleura: Moderate centrilobular emphysematous changes. Left lower lobe atelectasis. No pulmonary nodule. No pulmonary mass. Trace a small volume left pleural effusion. No pneumothorax. Musculoskeletal: No chest wall abnormality. No suspicious lytic or blastic osseous lesions. No acute displaced fracture. Dextroscoliosis of the thoracolumbar spine. CT ABDOMEN PELVIS FINDINGS Hepatobiliary: The hepatic parenchyma is diffusely hypodense compared to the splenic parenchyma consistent with fatty infiltration. No focal liver abnormality. No gallstones, gallbladder wall thickening, or pericholecystic fluid. No biliary dilatation. Pancreas: No focal lesion. Normal pancreatic contour. No surrounding inflammatory changes. No main pancreatic ductal dilatation. Spleen: Normal in size without focal abnormality. Adrenals/Urinary Tract: No adrenal nodule bilaterally. No nephrolithiasis and no  hydronephrosis. Fluid density lesions likely represent simple renal cysts. Simple renal cysts, in the absence of clinically indicated signs/symptoms, require no independent follow-up. No ureterolithiasis or hydroureter. The urinary bladder is unremarkable. Stomach/Bowel: Stomach is within normal limits. No evidence of bowel wall thickening or dilatation. Stool throughout the majority of the colon. Colonic diverticulosis. Appendix appears normal. Vascular/Lymphatic: No abdominal aorta or iliac aneurysm. Mild atherosclerotic plaque of the aorta and its branches. No abdominal, pelvic, or inguinal lymphadenopathy.  Reproductive: Uterus and bilateral adnexa are unremarkable. Other: No intraperitoneal free fluid. No intraperitoneal free gas. No organized fluid collection. Musculoskeletal: No abdominal wall hernia or abnormality. No suspicious lytic or blastic osseous lesions. No acute displaced fracture. Levoscoliosis of the lumbar spine centered at the L3-L4 level with associated degenerative changes. IMPRESSION: 1. Small volume pericardial effusion. 2. Left lower lobe atelectasis with superimposed infection not excluded. Limited evaluation on this noncontrast study. 3. Trace too small volume left pleural effusion. 4. Hepatic steatosis. 5. Colonic diverticulosis with no acute diverticulitis. 6. Aortic Atherosclerosis (ICD10-I70.0) and Emphysema (ICD10-J43.9). Electronically Signed   By: Morgane  Naveau M.D.   On: 07/13/2024 15:49   DG Chest 2 View Result Date: 07/13/2024 CLINICAL DATA:  chest pain EXAM: CHEST - 2 VIEW COMPARISON:  February 23, 2018 FINDINGS: Small left pleural effusion with patchy left basilar airspace opacities, likely atelectasis. Mild cardiomegaly. Tortuous aorta with aortic atherosclerosis. Moderate dextrocurvature of the thoracic spine, unchanged. No acute fracture or destructive lesions. Multilevel thoracic osteophytosis. IMPRESSION: Small left pleural effusion. Patchy left basilar airspace  opacities, which may represent atelectasis or a developing bronchopneumonia, in the correct clinical context. Electronically Signed   By: Rogelia Myers M.D.   On: 07/13/2024 14:27   Assessment and Plan:  Elevated troponin Pleuritic chest pain Patient presented with shortness of breath  Found to have LLL pneumonia, acute exacerbation of COPD  Echo this admission showed: LVEF 60-65%, G1DD, normal RV function, small pericardial effusion containing fibrous material Troponin level 34 > 29  CT chest showed left base infection vs atelectasis VQ scan negative for PE Patient reports no active chest pain or shortness of breath Currently on 2 L oxygen via [place], does not require baseline oxygen Suspect her chest pain to be secondary to viral pericarditis  Start colchicine  0.6 mg daily, instructed patient to stop if she experiences diarrhea Will repeat limited echo and follow up as an outpatient -- will arrange  Checking CRP, ESR  Checking ANA with reflex to rule out possible SLE   Hypertension Patient reports history of hypertension Renal U/S from 05/2024 showed normal kidneys bilaterally, no renal artery stenosis Home meds: clonidine  0.2 mg daily, Lopressor  25 mg BID Previously on amlodipine but was reported to not be taking due to hypotension  Most recent BP 173/77 Currently on clonidine  0.2 mg daily, Losartan  50 mg daily, Lopressor  25 mg BID   Per primary Sepsis  Pneumonia  Acute hypoxic respiratory failure AKI on CKD stage 3a Electrolyte disturbances Vitamin B12 deficiency  Hypothyroidism  Risk Assessment/Risk Scores:      For questions or updates, please contact [facility] Please consult [email or web address] for contact info under    Signed, Waddell DELENA Donath, PA-C  07/15/2024 5:03 PM  I have seen and examined the patient along with Waddell DELENA Donath, PA-C .  I have reviewed the chart, notes and new data.  I agree with PA/NP's note.  Key new complaints: Had classic  pleuritic and positional pain, now resolved.  Reports a family history of autoimmune disease in her mother, with combination of lupus and RA features. Key examination changes: Absent breath sounds and dullness to percussion in the left lung base.  No pericardial rub heard.  Regular rate and rhythm, no murmurs. Key new findings / data: Lung perfusion scan negative for pulmonary embolism.  CT shows evidence of emphysema and dense consolidation of the left lower lobe. No evidence of intrathoracic malignancy.  Echo shows a small pericardial effusion, without tamponade and  a hyperdynamic left ventricle.  I do not think that we are seeing fibrinous strands in the pericardium just the highlighted difference in echogenicity between the pericardial fluid and a more generous layer of epicardial fat.  PLAN:  Very likely that she has parapneumonic pericarditis. Can treat with NSAIDs for short while, but not necessary once the pleuritic pain resolves. Colchicine  0.6 mg once daily (adjusted for renal function) to reduce the likelihood of recurrent pericarditis.  Follow-up echocardiogram end of month and then a follow-up in clinic. Labs for possible autoimmune disorder sent.  Also checked nonspecific inflammatory markers that we can follow long-term. I think we have several options to improve her blood pressure control including switching from metoprolol  (and higher doses that this caused bradycardia) to carvedilol or increasing the dose of losartan  thereby avoiding the use of alpha blockers with high risk of orthostatic hypotension (has happened when she was given doxazosin) and eventually minimizing the use of clonidine  with its numerous side effects.  Titration of blood pressure medicines will be best performed as an outpatient, after discharge and recovery from the current acute illness.   Ritamarie Arkin, MD, FACC [facility] [phone] 07/15/2024, 5:17 PM

## 2024-07-15 NOTE — Plan of Care (Signed)

## 2024-07-16 DIAGNOSIS — J441 Chronic obstructive pulmonary disease with (acute) exacerbation: Secondary | ICD-10-CM | POA: Diagnosis not present

## 2024-07-16 DIAGNOSIS — N1831 Chronic kidney disease, stage 3a: Secondary | ICD-10-CM

## 2024-07-16 DIAGNOSIS — I10 Essential (primary) hypertension: Secondary | ICD-10-CM

## 2024-07-16 LAB — LIPID PANEL
Cholesterol: 228 mg/dL — ABNORMAL HIGH (ref 0–200)
HDL: 31 mg/dL — ABNORMAL LOW (ref >40)
LDL Cholesterol: 149 mg/dL — ABNORMAL HIGH (ref 0–99)
Total CHOL/HDL Ratio: 7.4 ratio
Triglycerides: 242 mg/dL — ABNORMAL HIGH (ref <150)
VLDL: 48 mg/dL — ABNORMAL HIGH (ref 0–40)

## 2024-07-16 LAB — BASIC METABOLIC PANEL WITH GFR
Anion gap: 16 — ABNORMAL HIGH (ref 5–15)
BUN: 28 mg/dL — ABNORMAL HIGH (ref 8–23)
CO2: 21 mmol/L — ABNORMAL LOW (ref 22–32)
Calcium: 9.2 mg/dL (ref 8.9–10.3)
Chloride: 103 mmol/L (ref 98–111)
Creatinine, Ser: 1.49 mg/dL — ABNORMAL HIGH (ref 0.44–1.00)
GFR, Estimated: 39 mL/min — ABNORMAL LOW
Glucose, Bld: 112 mg/dL — ABNORMAL HIGH (ref 70–99)
Potassium: 3.6 mmol/L (ref 3.5–5.1)
Sodium: 140 mmol/L (ref 135–145)

## 2024-07-16 LAB — URIC ACID: Uric Acid, Serum: 7.8 mg/dL — ABNORMAL HIGH (ref 2.5–7.1)

## 2024-07-16 LAB — ANTINUCLEAR ANTIBODIES, IFA: ANA Ab, IFA: NEGATIVE

## 2024-07-16 MED ORDER — ATORVASTATIN CALCIUM 40 MG PO TABS
40.0000 mg | ORAL_TABLET | Freq: Every day | ORAL | Status: DC
  Administered 2024-07-16: 40 mg via ORAL
  Filled 2024-07-16: qty 1

## 2024-07-16 MED ORDER — CYANOCOBALAMIN 1000 MCG/ML IJ SOLN
1000.0000 ug | Freq: Once | INTRAMUSCULAR | Status: AC
  Administered 2024-07-17: 1000 ug via INTRAMUSCULAR
  Filled 2024-07-16: qty 1

## 2024-07-16 MED ORDER — VITAMIN B-12 1000 MCG PO TABS: 1000.0000 ug | ORAL_TABLET | Freq: Every day | ORAL | Status: DC

## 2024-07-16 NOTE — Plan of Care (Signed)
  Problem: Activity: Goal: Risk for activity intolerance will decrease Outcome: Progressing   Problem: Elimination: Goal: Will not experience complications related to urinary retention Outcome: Progressing   Problem: Pain Managment: Goal: General experience of comfort will improve and/or be controlled Outcome: Progressing   Problem: Safety: Goal: Ability to remain free from injury will improve Outcome: Progressing

## 2024-07-16 NOTE — Progress Notes (Addendum)
 Progress Note  Patient Name: Gracemarie Skeet Date of Encounter: 07/16/2024  Primary Cardiologist:   Jerel Balding, MD   Subjective   She feels better than she did no presentation.   No further chest pain.  Still not breathing at baseline.   Inpatient Medications    Scheduled Meds:  aspirin  EC  81 mg Oral Daily   atorvastatin   40 mg Oral QHS   azithromycin   500 mg Oral Daily   buPROPion   150 mg Oral Daily   cloNIDine   0.2 mg Oral QHS   colchicine   0.6 mg Oral Daily   cyanocobalamin   1,000 mcg Intramuscular Q1200   Followed by   NOREEN ON 07/17/2024] vitamin B-12  1,000 mcg Oral Daily   enoxaparin  (LOVENOX ) injection  40 mg Subcutaneous QPM   ezetimibe   10 mg Oral Daily   guaiFENesin   600 mg Oral BID   levothyroxine   100 mcg Oral Q0600   losartan   50 mg Oral Daily   metoprolol  tartrate  25 mg Oral BID   mirtazapine   30 mg Oral QHS   predniSONE   50 mg Oral Q breakfast   pregabalin   100 mg Oral QHS   Continuous Infusions:  cefTRIAXone  (ROCEPHIN )  IV 1 g (07/15/24 1553)   PRN Meds: acetaminophen  **OR** acetaminophen , albuterol , benzonatate , fentaNYL  (SUBLIMAZE ) injection, hydrALAZINE , melatonin, naLOXone  (NARCAN )  injection, ondansetron  (ZOFRAN ) IV, oxyCODONE    Vital Signs    Vitals:   07/16/24 0421 07/16/24 0738 07/16/24 0800 07/16/24 0808  BP: (!) 162/82 (!) 145/83    Pulse: (!) 58 63    Resp: 19 18    Temp: 97.6 F (36.4 C) 98 F (36.7 C)    TempSrc: Oral Oral    SpO2: 93% 92% 99% 99%  Weight: 68.3 kg     Height:        Intake/Output Summary (Last 24 hours) at 07/16/2024 1043 Last data filed at 07/15/2024 2139 Gross per 24 hour  Intake 240 ml  Output --  Net 240 ml   Filed Weights   07/14/24 0354 07/15/24 0526 07/16/24 0421  Weight: 70.6 kg 69.9 kg 68.3 kg    Telemetry    NSR - Personally Reviewed  ECG    NA - Personally Reviewed  Physical Exam   GEN: No acute distress.   Neck: No  JVD Cardiac: RRR, no murmurs, no rubs, or gallops.   Respiratory:     Decreased breath sounds bilateral.  GI: Soft, nontender, non-distended  MS: No  edema; No deformity. Neuro:  Nonfocal  Psych: Normal affect   Labs    Chemistry Recent Labs  Lab 07/14/24 0515 07/15/24 0502 07/16/24 1000  NA 133* 141 140  K 4.0 4.0 3.6  CL 103 103 103  CO2 20* 24 21*  GLUCOSE 168* 183* 112*  BUN 27* 26* 28*  CREATININE 1.35* 1.40* 1.49*  CALCIUM  8.2* 9.5 9.2  PROT 6.8 7.1  --   ALBUMIN  2.7* 2.7*  --   AST 39 33  --   ALT 24 32  --   ALKPHOS 80 95  --   BILITOT 1.2 0.8  --   GFRNONAA 44* 42* 39*  ANIONGAP 10 14 16*     Hematology Recent Labs  Lab 07/13/24 1257 07/14/24 0515 07/15/24 0502  WBC 14.1* 10.8* 14.8*  RBC 4.49 4.18 4.78  HGB 15.1* 13.9 15.8*  HCT 43.9 41.7 45.3  MCV 97.8 99.8 94.8  MCH 33.6 33.3 33.1  MCHC 34.4 33.3 34.9  RDW 14.1 14.2 13.8  PLT 385 221 434*    Cardiac EnzymesNo results for input(s): TROPONINI in the last 168 hours. No results for input(s): TROPIPOC in the last 168 hours.   BNPNo results for input(s): BNP, PROBNP in the last 168 hours.   DDimer  Recent Labs  Lab 07/13/24 1300  DDIMER 3.89*     Radiology    NM Pulmonary Perfusion Result Date: 07/15/2024 EXAM: NM Lung Perfusion Scan. CLINICAL HISTORY: Pulmonary embolism (PE) suspected, high prob; sob, pleuritic chest pain, elevated d-dimer; remote h/o PE. Pt has been experiencing SOB that has been ongoing since Sunday. She quit smoking 6 years ago and has a hx of PE and DVT. TECHNIQUE: Radiolabeled MAA was administered intravenously and planar images of the lungs were obtained in multiple projections. RADIOPHARMACEUTICAL: 4.4 millicurie (technetium albumin aggregated (MAA) injection solution 4.4 millicurie TECHNETIUM TO 99M ALBUMIN AGGREGATED) COMPARISON: Chest radiograph from 07/15/2024. FINDINGS: PERFUSION: No segmental perfusion defects identified. Mild non-segmental decreased radiotracer activity is identified within the apical segment  of the right upper lobe. IMPRESSION: 1. No segmental perfusion defects to indicate pulmonary emboli. 2. Mild non-segmental decreased radiotracer activity within the apical segment of the right upper lobe. 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 Electronically signed by: Taylor Stroud MD 07/15/2024 04:33 PM EDT RP Workstation: [id number]   DG CHEST PORT 1 VIEW Result Date: 07/15/2024 CLINICAL DATA:  Possible community-acquired pneumonia. EXAM: PORTABLE CHEST 1 VIEW COMPARISON:  07/13/2024 FINDINGS: Lungs are adequately inflated demonstrate persistent left base/retrocardiac opacification likely slightly worse. Mild blunting of the left costophrenic angle which may represent a small amount of pleural fluid. Cardiomediastinal silhouette and remainder of the exam is unchanged. IMPRESSION: Left base/retrocardiac opacification likely slightly worse and may be due to infection versus atelectasis. Possible small amount of left pleural fluid. Electronically Signed   By: Daniel  Boyle M.D.   On: 07/15/2024 09:46   ECHOCARDIOGRAM COMPLETE Result Date: 07/14/2024 C     ECHOCARDIOGRAM REPORT   Patient Name:   Suleima MARIE Laird Date of Exam: 07/14/2024 Medical Rec #:  [id number]          Height:       64.0 in Accession #:    [id number]         Weight:       155.7 lb Date of Birth:  12/03/1961         BSA:          1.759 m Patient Age:    63 years           BP:           144/68 mmHg Patient Gender: F                  HR:           87  bpm. Exam Location:  Inpatient Procedure: 2D Echo, Cardiac Doppler and Color Doppler (Both Spectral and Color            Flow Doppler were utilized during procedure). Indications:    Chest Pain R07.9  History:        Patient has prior history of Echocardiogram examinations, most                 recent 09/02/2019. COPD, Signs/Symptoms:Chest Pain; Risk                 Factors:Hypertension.  Sonographer:     Thea  [facility] Referring Phys: [id number] JUSTIN B HOWERTER IMPRESSIONS  1. Left ventricular ejection fraction, by estimation, is 60 to 65%. The left ventricle has normal function. Left ventricular endocardial border not optimally defined to evaluate regional wall motion. Left ventricular diastolic parameters are consistent with Grade I diastolic dysfunction (impaired relaxation).  2. Right ventricular systolic function is normal. The right ventricular size is normal.  3. The mitral valve is normal in structure. No evidence of mitral valve regurgitation. No evidence of mitral stenosis.  4. The aortic valve is tricuspid. Aortic valve regurgitation is trivial. Aortic valve sclerosis/calcification is present, without any evidence of aortic stenosis. Aortic valve Vmax measures 1.63 m/s.  5. A small pericardial effusion is present. The pericardial effusion is circumferential. The pericardial effusion appears to contain fibrous material.  6. Recommend repeat limited study with definity contrast for further evaluation of wall motion. Consider cardiac MRI for further visualization of the pericardium FINDINGS  Left Ventricle: Left ventricular ejection fraction, by estimation, is 60 to 65%. The left ventricle has normal function. Left ventricular endocardial border not optimally defined to evaluate regional wall motion. The left ventricular internal cavity size was normal in size. There is no left ventricular hypertrophy. Left ventricular diastolic parameters are consistent with Grade I diastolic dysfunction (impaired relaxation). Normal left ventricular filling pressure. Right Ventricle: The right ventricular size is normal. No increase in right ventricular wall thickness. Right ventricular systolic function is normal. Left Atrium: Left atrial size was normal in size. Right Atrium: Right atrial size was normal in size. Pericardium: A small pericardial effusion is present. The pericardial effusion is circumferential. The  pericardial effusion appears to contain fibrous material. Mitral Valve: The mitral valve is normal in structure. No evidence of mitral valve regurgitation. No evidence of mitral valve stenosis. Tricuspid Valve: The tricuspid valve is normal in structure. Tricuspid valve regurgitation is not demonstrated. No evidence of tricuspid stenosis. Aortic Valve: The aortic valve is tricuspid. Aortic valve regurgitation is trivial. Aortic valve sclerosis/calcification is present, without any evidence of aortic stenosis. Aortic valve peak gradient measures 10.6 mmHg. Pulmonic Valve: The pulmonic valve was normal in structure. Pulmonic valve regurgitation is not visualized. No evidence of pulmonic stenosis. Aorta: The aortic root is normal in size and structure. Venous: The inferior vena cava was not well visualized. IAS/Shunts: No atrial level shunt detected by color flow Doppler.  LEFT VENTRICLE PLAX 2D LVIDd:         4.40 cm   Diastology LVIDs:         3.10 cm   LV e' medial:    9.45 cm/s LV PW:         1.10 cm   LV E/e' medial:  6.3 LV IVS:        0.80 cm   LV e' lateral:   13.30 cm/s LVOT diam:     2.10 cm   LV E/e' lateral: 4.5 LV SV:         69 LV SV Index:   39 LVOT Area:     3.46 cm  RIGHT VENTRICLE RV S prime:     16.70 cm/s LEFT ATRIUM           Index LA diam:      3.50 cm 1.99 cm/m LA Vol (A4C): 37.7 ml 21.43 ml/m  AORTIC VALVE AV Area (Vmax): 2.45 cm AV Vmax:        163.00 cm/s AV Peak Grad:   10.6 mmHg LVOT Vmax:  115.50 cm/s LVOT Vmean:     78.350 cm/s LVOT VTI:       0.198 m  AORTA Ao Root diam: 3.40 cm Ao Asc diam:  3.50 cm MITRAL VALVE MV Area (PHT): 3.46 cm    SHUNTS MV Decel Time: 219 msec    Systemic VTI:  0.20 m MV E velocity: 59.70 cm/s  Systemic Diam: 2.10 cm MV A velocity: 83.30 cm/s MV E/A ratio:  0.72 Wilbert Bihari MD Electronically signed by Wilbert Bihari MD Signature Date/Time: 07/14/2024/5:11:12 PM    Final     Cardiac Studies   Echo see above.   Patient Profile     63 y.o. female with  a hx of COPD, history of PE (2003), hypothyroidism 2/2 treatment of Graves disease, hypertension, polycythemia secondary to OSA, who is being seen 07/15/2024 for the evaluation of chest pain, elevated troponin, abnormal echo finding at the request of Dr. Von.   Assessment & Plan    Elevated troponin:    Chest pain thought to be non anginal and related possibly to parapneumonic pericarditis.   Continue long term colchicine  once daily given renal insufficiency.   She will have repeat echo as an out patient.   Elevated CRP and sed rate consistent with known inflammation with sepsis.  Not particularly helpful in this situation.   She is currently getting prednisone  for her pulmonary process.   I would suggest 6 weeks to 3 months of colchicine  and Dr. Francyne will follow up on this.   Follow up has been scheduled in our office for 9/2 and echo has been ordered.      HTN:    BP has trended down.  She has had bradycardia on higher doses of beta blocker.   She has had orthostasis on alpha blockers.  She did not tolerate Norvasc .  Plan to titrate meds as an out patient.     CKD IIIa:    Follow creat as an out patient.     Dyslipidemia :  Started on Lipitor.      For questions or updates, please contact [facility] Please consult [email or web address] for contact info under Cardiology/STEMI.   Signed, Lynwood Schilling, MD  07/16/2024, 10:43 AM

## 2024-07-16 NOTE — Progress Notes (Signed)
 [facility] Progress Note  Patient: Cheyenne Lucero    FMW:[id number]  DOA: 07/13/2024     Date of Service: the patient was seen and examined on 07/16/2024  Chief Complaint  Patient presents with   Chest Pain   Brief hospital course: Cheyenne Lucero is a 63 y.o. female with medical history significant for COPD, pulmonary embolism 2003 (not on anticoagulation anymore), acquired hypothyroidism, essential hypertension, who presented to ED with shortness of breath and iadmitted to [facility] on 07/13/2024 for major diagnosis of acute hypoxic respiratory secondary to acute COPD exacerbation, left lower lobe pneumonia and possible PE   Patient was admitted on 07/13/2024 and I started taking care of her on 07/15/2024.  Please review H&P and prior notes for details.  Further management as below.  Assessment and Plan:  # Severe sepsis secondary to pneumonia # Acute hypoxic respiratory failure secondary to community-acquired pneumonia Continue supplemental O2 inhalation and gradually wean off Continue ceftriaxone  and azithromycin  Patient will need follow-up with PCP to repeat chest x-ray after 4 weeks for resolution of pneumonia Continue prednisone  50 mg p.o. daily for 4 days Continue DuoNeb every 6 hourly   # Elevated troponin most likely demand ischemia Continue to monitor on telemetry Abnormal TTE, LVEF 60-65%, grade 1 diastolic dysfunction.  Pericardial effusion with fibrinous material. Recommended limited echo with contrast and cardiac MRI Cardiology was consulted for further recommendation Parapneumonic pericarditis as per cardiology.  Started colchicine  0.6 mg p.o. daily, recommended to continue colchicine  for 6 weeks to 3 months and repeat 2D echocardiogram after 1 month and follow-up with cardiology as an outpatient.  Already scheduled appointment on 9/2 Elevated CRP 13.4 and ESR 111   # Elevated D-dimer Prior history of PE in 2003, s/p DOAC, completed course VQ scan  negative for PE S/p Lovenox  twice daily therapeutic dose given during hospital stay.  Which has been discontinued   # AKI on CKD stage IIIa Baseline sCr 1.3-1.4 sCr 2.08>>1.4, back to her baseline Continue to monitor renal functions and urine output  # Essential hypertension Home meds: Losartan  100, metoprolol  25 mg p.o. twice daily, clonidine  0.4 mg nightly She is not taking Amlodipine , due to intolerance Antihypertensive medications were held on admission due to sepsis BP improved, 8/7 resumed clonidine  0.2 mg p.o. nightly and metoprolol  25 mg p.o. twice daily 8/8 BP slightly elevated, resumed Losartan  at low-dose 50 mg p.o. daily with holding parameters 8/9 cards recommended to titrate meds as an outpatient  # Hyperlipidemia LDL 149 elevated Started Lipitor 40 mg p.o. daily Repeat lipid profile after 6 months, follow with PCP   # Hypophosphatemia, Phos repleted Monitor electrolytes daily  # Vitamin B12 deficiency: B12 level 118, goal >400 Started vitamin B12 1000 mcg IM injection daily during hospital stay, followed by oral supplement.  Follow-up PCP to repeat vitamin B12 level after 3 to 6 months.  # History of neuropathy: could be secondary to vitamin B12 deficiency Continue Lyrica  100 mg nightly home dose  # Hypothyroid: continue Synthroid   # Depression: Continue Wellbutrin  and Remeron  Home meds  # Uric acid slightly elevated, no need of allopurinol at this time due to no symptoms of gout.  Follow with PCP to repeat uric acid level after few months   Body mass index is 25.85 kg/m.  Interventions:  Diet: Regular diet DVT Prophylaxis: Subcutaneous Lovenox    Advance goals of care discussion: Full code  Family Communication: family was present at bedside, at the time of interview.  The  pt provided permission to discuss medical plan with the family. Opportunity was given to ask question and all questions were answered satisfactorily.   Disposition:  Pt is from  home, admitted with respiratory failure due to pneumonia, abnormal TTE, still has shortness of breath and on IV antibiotics, which precludes a safe discharge. Discharge to home, when stable, most likely tomorrow a.m.   Subjective: No significant overnight events, still has mild shortness of breath requiring supplemental O2 halogen on 2 L oxygen. Denied any chest pain or palpitations. We will continue to current treatment today and plan is for discharge tomorrow a.m.  Patient agreed with the plan.   Physical Exam: General: NAD, lying comfortably Appear in no distress, affect appropriate Eyes: PERRLA ENT: Oral Mucosa Clear, moist  Neck: no JVD,  Cardiovascular: S1 and S2 Present, no Murmur,  Respiratory: Equal air entry bilaterally, mild crackles, no wheezes  Abdomen: Bowel Sound present, Soft and no tenderness,  Skin: no rashes Extremities: no Pedal edema, no calf tenderness Neurologic: without any new focal findings Gait not checked due to patient safety concerns  Vitals:   07/16/24 0800 07/16/24 0808 07/16/24 1134 07/16/24 1141  BP:   (!) 191/98 (!) 155/83  Pulse:   70   Resp:   16   Temp:   97.8 F (36.6 C)   TempSrc:   Oral   SpO2: 99% 99% 90%   Weight:      Height:        Intake/Output Summary (Last 24 hours) at 07/16/2024 1341 Last data filed at 07/15/2024 2139 Gross per 24 hour  Intake 240 ml  Output --  Net 240 ml   Filed Weights   07/14/24 0354 07/15/24 0526 07/16/24 0421  Weight: 70.6 kg 69.9 kg 68.3 kg    Data Reviewed: I have personally reviewed and interpreted daily labs, tele strips, imagings as discussed above. I reviewed all nursing notes, pharmacy notes, vitals, pertinent old records I have discussed plan of care as described above with RN and patient/family.  CBC: Recent Labs  Lab 07/13/24 1257 07/14/24 0515 07/15/24 0502  WBC 14.1* 10.8* 14.8*  NEUTROABS  --  6.8 11.6*  HGB 15.1* 13.9 15.8*  HCT 43.9 41.7 45.3  MCV 97.8 99.8 94.8  PLT 385  221 434*   Basic Metabolic Panel: Recent Labs  Lab 07/13/24 1257 07/14/24 0515 07/15/24 0502 07/15/24 1019 07/16/24 1000  NA 136 133* 141  --  140  K 4.4 4.0 4.0  --  3.6  CL 97* 103 103  --  103  CO2 25 20* 24  --  21*  GLUCOSE 129* 168* 183*  --  112*  BUN 39* 27* 26*  --  28*  CREATININE 2.08* 1.35* 1.40*  --  1.49*  CALCIUM  10.0 8.2* 9.5  --  9.2  MG  --  2.1  --  2.3  --   PHOS  --  2.4*  --  2.3*  --     Studies: No results found.   Scheduled Meds:  aspirin  EC  81 mg Oral Daily   atorvastatin   40 mg Oral QHS   azithromycin   500 mg Oral Daily   buPROPion   150 mg Oral Daily   cloNIDine   0.2 mg Oral QHS   colchicine   0.6 mg Oral Daily   [START ON 07/17/2024] vitamin B-12  1,000 mcg Oral Daily   enoxaparin  (LOVENOX ) injection  40 mg Subcutaneous QPM   ezetimibe   10 mg Oral Daily  guaiFENesin   600 mg Oral BID   levothyroxine   100 mcg Oral Q0600   losartan   50 mg Oral Daily   metoprolol  tartrate  25 mg Oral BID   mirtazapine   30 mg Oral QHS   predniSONE   50 mg Oral Q breakfast   pregabalin   100 mg Oral QHS   Continuous Infusions:  cefTRIAXone  (ROCEPHIN )  IV 1 g (07/16/24 1338)   PRN Meds: acetaminophen  **OR** acetaminophen , albuterol , benzonatate , fentaNYL  (SUBLIMAZE ) injection, hydrALAZINE , melatonin, naLOXone  (NARCAN )  injection, ondansetron  (ZOFRAN ) IV, oxyCODONE   Time spent: 40 minutes  Author: ELVAN SOR. MD [facility] 07/16/2024 1:41 PM  To reach On-call, see care teams to locate the attending and reach out to them via [email or web address]. If 7PM-7AM, please contact night-coverage If you still have difficulty reaching the attending provider, please page the [facility] (Director on Call) for [facility] on amion for assistance.

## 2024-07-17 DIAGNOSIS — J441 Chronic obstructive pulmonary disease with (acute) exacerbation: Secondary | ICD-10-CM | POA: Diagnosis not present

## 2024-07-17 LAB — MAGNESIUM: Magnesium: 2.1 mg/dL (ref 1.7–2.4)

## 2024-07-17 LAB — PHOSPHORUS: Phosphorus: 5 mg/dL — ABNORMAL HIGH (ref 2.5–4.6)

## 2024-07-17 LAB — LEGIONELLA PNEUMOPHILA SEROGP 1 UR AG: L. pneumophila Serogp 1 Ur Ag: NEGATIVE

## 2024-07-17 MED ORDER — AMLODIPINE BESYLATE 2.5 MG PO TABS: 2.5000 mg | ORAL_TABLET | Freq: Every day | ORAL | 2 refills | Status: DC

## 2024-07-17 MED ORDER — AMLODIPINE BESYLATE 2.5 MG PO TABS
2.5000 mg | ORAL_TABLET | Freq: Every day | ORAL | Status: DC
  Administered 2024-07-17: 2.5 mg via ORAL
  Filled 2024-07-17: qty 1

## 2024-07-17 MED ORDER — AMOXICILLIN-POT CLAVULANATE 875-125 MG PO TABS: 1.0000 | ORAL_TABLET | Freq: Two times a day (BID) | ORAL | 0 refills | Status: AC

## 2024-07-17 MED ORDER — GUAIFENESIN-DM 100-10 MG/5ML PO SYRP: 10.0000 mL | ORAL_SOLUTION | Freq: Four times a day (QID) | ORAL | 0 refills | Status: DC | PRN

## 2024-07-17 MED ORDER — COLCHICINE 0.6 MG PO TABS: 0.6000 mg | ORAL_TABLET | Freq: Every day | ORAL | 2 refills | Status: DC

## 2024-07-17 MED ORDER — ATORVASTATIN CALCIUM 40 MG PO TABS: 40.0000 mg | ORAL_TABLET | Freq: Every day | ORAL | 5 refills | Status: DC

## 2024-07-17 MED ORDER — CYANOCOBALAMIN 1000 MCG PO TABS: 1000.0000 ug | ORAL_TABLET | Freq: Every day | ORAL | 1 refills | Status: DC

## 2024-07-17 NOTE — Discharge Summary (Signed)
 [facility] Discharge Summary   Patient: Cheyenne Lucero FMW:[id number]  PCP: Burney Darice CROME, MD  Date of admission: 07/13/2024   Date of discharge:  07/17/2024     Discharge Diagnoses:  Principal Problem:   COPD exacerbation (HCC) Active Problems:   Acquired hypothyroidism   Acute hypoxic respiratory failure (HCC)   Severe sepsis (HCC)   CAP (community acquired pneumonia)   Elevated d-dimer   Atypical chest pain   Elevated troponin   Elevated serum creatinine   History of essential hypertension   Admitted From: Home Disposition:  Home   Recommendations for Outpatient Follow-up:  Follow-up with PCP in 1 week, monitor BP at home and follow with the PCP to titrate medication accordingly.  Taper off clonidine  and started low-dose amlodipine . Repeat BMP in 1 week to check renal functions, continue oral hydration at home due to slightly elevated creatinine. Repeat uric acid level after 3 months Repeat 2D echocardiogram 1 month as per cardiology Follow-up with cardiology in 1 month for pericarditis, continue colchicine  and follow with cardiology for duration of treatment. Repeat chest x-ray after 4 weeks for resolution of pneumonia Repeat vitamin B12 level after 3 to 6 months, keep B12 levels in the upper limit to prevent deficiency in future Repeat lipid profile after 6 months Follow up LABS/TEST:  As above   Follow-up Information     Burney Darice CROME, MD Follow up in 1 week(s).   Specialty: Family Medicine Contact information: [address] [place] [address] [phone]                Diet recommendation: Cardiac diet  Activity: The patient is advised to gradually reintroduce usual activities, as tolerated  Discharge Condition: stable  Code Status: Full code   History of present illness: As per the H and P dictated on admission.  Hospital Course:  Cheyenne Lucero is a 63 y.o. female with medical history significant for COPD,  pulmonary embolism 2003 (not on anticoagulation anymore), acquired hypothyroidism, essential hypertension, who presented to ED with shortness of breath and iadmitted to [facility] on 07/13/2024 for major diagnosis of acute hypoxic respiratory secondary to acute COPD exacerbation, left lower lobe pneumonia and possible PE    Patient was admitted on 07/13/2024 and I started taking care of her on 07/15/2024.  Please review H&P and prior notes for details.  Further management as below.   Assessment and Plan:   # Severe sepsis secondary to pneumonia # Acute hypoxic respiratory failure secondary to community-acquired pneumonia S/p supplemental O2 inhalation and gradually weaned off today morning.  Currently patient is saturating well on room air. S/p azithromycin  x 5 days and ceftriaxone  x 4 days.  Discharged on Augmentin  twice daily for 3 days. F/u with PCP to repeat chest x-ray after 4 weeks for resolution of pneumonia. S/p prednisone  50 mg p.o. daily for 4 days and DuoNeb every 6 hourly.  No more need of steroids. Use Robitussin DM as needed    # Elevated troponin most likely demand ischemia Continue to monitor on telemetry Abnormal TTE, LVEF 60-65%, grade 1 diastolic dysfunction.  Pericardial effusion with fibrinous material. Recommended limited echo with contrast and cardiac MRI Cardiology was consulted for further recommendation Parapneumonic pericarditis as per cardiology.  Started colchicine  0.6 mg p.o. daily, recommended to continue colchicine  for 6 weeks to 3 months and repeat 2D echocardiogram after 1 month and follow-up with cardiology as an outpatient.  Already scheduled appointment on 9/2 Elevated CRP  13.4 and ESR 111     # Elevated D-dimer Prior history of PE in 2003, s/p DOAC, completed course VQ scan negative for PE S/p Lovenox  twice daily therapeutic dose given during hospital stay.  Which has been discontinued     # AKI on CKD stage IIIa Baseline sCr 1.3-1.4 sCr  2.08>>1.4>>149 slightly elevated. Patient was advised to continue oral hydration at home and follow with PCP to repeat BMP after 1 week   # Essential hypertension Home meds: Losartan  100, metoprolol  25 mg p.o. twice daily, clonidine  0.4 mg nightly. She is not taking Amlodipine , due to intolerance. Antihypertensive medications were held on admission due to sepsis BP improved, 8/7 resumed clonidine  0.2 mg p.o. nightly and metoprolol  25 mg p.o. twice daily 8/8 BP slightly elevated, resumed Losartan  at low-dose 50 mg p.o. daily with holding parameters 8/10 started amlodipine  2.5 mg p.o. daily by cardiology.  Recommended to wean off clonidine .  So patient was started on tapering dose clonidine  to taper off gradually. Patient was advised to monitor BP at home and follow with PCP and cardiology to titrate medications accordingly.   # Hyperlipidemia: LDL 149 elevated Started Lipitor 40 mg p.o. daily Repeat lipid profile after 6 months, follow with PCP    # Hypophosphatemia, Phos repleted. # Vitamin B12 deficiency: B12 level 118, goal >400 Started vitamin B12 1000 mcg IM injection daily during hospital stay, followed by oral supplement.  Follow-up PCP to repeat vitamin B12 level after 3 to 6 months.   # History of neuropathy: could be secondary to vitamin B12 deficiency. Continue Lyrica  100 mg nightly home dose   # Hypothyroid: continue Synthroid    # Depression: Continue Wellbutrin  and Remeron  Home meds   # Uric acid slightly elevated, no need of allopurinol at this time due to no symptoms of gout.  Follow with PCP to repeat uric acid level after few months    Body mass index is 26.25 kg/m.  Nutrition Interventions:  - Patient was instructed, not to drive, operate heavy machinery, perform activities at heights, swimming or participation in water activities or provide baby sitting services while on Pain, Sleep and Anxiety Medications; until her outpatient Physician has advised to do so again.   - Also recommended to not to take more than prescribed Pain, Sleep and Anxiety Medications.  Patient was ambulatory without any assistance. On the day of the discharge the patient's vitals were stable, and no other acute medical condition were reported by patient. the patient was felt safe to be discharge at Home.  Consultants: Cardiology Procedures: None  Discharge Exam: General: Appear in no distress, no Rash; Oral Mucosa Clear, moist. Cardiovascular: S1 and S2 Present, no Murmur, Respiratory: normal respiratory effort, Bilateral Air entry present and no Crackles, no wheezes Abdomen: Bowel Sound present, Soft and no tenderness, no hernia Extremities: no Pedal edema, no calf tenderness Neurology: alert and oriented to time, place, and person affect appropriate.  Filed Weights   07/15/24 0526 07/16/24 0421 07/17/24 0436  Weight: 69.9 kg 68.3 kg 69.4 kg   Vitals:   07/17/24 0959 07/17/24 1132  BP:  (!) 150/76  Pulse: 70 (!) 57  Resp:  18  Temp:  98 F (36.7 C)  SpO2: 93% 93%    DISCHARGE MEDICATION: Allergies as of 07/17/2024       Reactions   Morphine And Codeine Anaphylaxis   Tramadol  Nausea And Vomiting        Medication List     TAKE these medications  acetaminophen  325 MG tablet Commonly known as: TYLENOL  Take 650 mg by mouth every 6 (six) hours as needed for mild pain.   amLODipine  2.5 MG tablet Commonly known as: NORVASC  Take 1 tablet (2.5 mg total) by mouth daily. What changed:  medication strength how much to take   amoxicillin -clavulanate 875-125 MG tablet Commonly known as: AUGMENTIN  Take 1 tablet by mouth 2 (two) times daily for 3 days.   aspirin  EC 81 MG tablet Take 81 mg by mouth daily. Swallow whole.   atorvastatin  40 MG tablet Commonly known as: LIPITOR Take 1 tablet (40 mg total) by mouth at bedtime.   buPROPion  150 MG 24 hr tablet Commonly known as: WELLBUTRIN  XL Take 150 mg by mouth daily.   cloNIDine  0.2 MG tablet Commonly  known as: CATAPRES  Take 1.5 tablets (0.3 mg total) by mouth at bedtime, THEN 1 tablet (0.2 mg total) at bedtime, THEN 0.5 tablets (0.1 mg total) at bedtime. Start taking on: July 17, 2024 What changed: See the new instructions.   colchicine  0.6 MG tablet Take 1 tablet (0.6 mg total) by mouth daily. Start taking on: July 18, 2024   cyanocobalamin  1000 MCG tablet Take 1 tablet (1,000 mcg total) by mouth daily. Start taking on: July 18, 2024   ezetimibe  10 MG tablet Commonly known as: ZETIA  Take 10 mg by mouth daily.   guaiFENesin -dextromethorphan 100-10 MG/5ML syrup Commonly known as: ROBITUSSIN DM Take 10 mLs by mouth every 6 (six) hours as needed for cough.   levothyroxine  100 MCG tablet Commonly known as: SYNTHROID  Take 100 mcg by mouth daily before breakfast.   losartan  100 MG tablet Commonly known as: COZAAR  Take 1 tablet by mouth daily.   metoprolol  tartrate 50 MG tablet Commonly known as: LOPRESSOR  Take 25 mg by mouth 2 (two) times daily.   mirtazapine  30 MG tablet Commonly known as: REMERON  Take 30 mg by mouth at bedtime.   pregabalin  50 MG capsule Commonly known as: LYRICA  Take 100 mg by mouth at bedtime.   Vitamin D3 50 MCG (2000 UT) Tabs Take 1 tablet by mouth daily.               Durable Medical Equipment  (From admission, onward)           Start     Ordered   07/17/24 0959  For home use only DME oxygen  Once       Question Answer Comment  Length of Need 6 Months   Mode or (Route) Nasal cannula   Liters per Minute 2   Frequency Continuous (stationary and portable oxygen unit needed)   Oxygen conserving device Yes   Oxygen delivery system Gas      07/17/24 0958           Allergies  Allergen Reactions   Morphine And Codeine Anaphylaxis   Tramadol  Nausea And Vomiting   Discharge Instructions     Call MD for:   Complete by: As directed    Chest pain or palpitations   Call MD for:  difficulty breathing, headache or  visual disturbances   Complete by: As directed    Call MD for:  extreme fatigue   Complete by: As directed    Call MD for:  persistant dizziness or light-headedness   Complete by: As directed    Call MD for:  persistant nausea and vomiting   Complete by: As directed    Call MD for:  severe uncontrolled pain   Complete by: As  directed    Call MD for:  temperature >100.4   Complete by: As directed    Diet - low sodium heart healthy   Complete by: As directed    Discharge instructions   Complete by: As directed    Follow-up with PCP in 1 week, monitor BP at home and follow with the PCP to titrate medication accordingly.  Taper off clonidine  and started low-dose amlodipine . Repeat BMP in 1 week to check renal functions, continue oral hydration at home due to slightly elevated creatinine. Repeat uric acid level after 3 months Repeat 2D echocardiogram 1 month as per cardiology Follow-up with cardiology in 1 month for pericarditis, continue colchicine  and follow with cardiology for duration of treatment. Repeat chest x-ray after 4 weeks for resolution of pneumonia Repeat vitamin B12 level after 3 to 6 months, keep B12 levels in the upper limit to prevent deficiency in future Repeat lipid profile after 6 months   Increase activity slowly   Complete by: As directed        The results of significant diagnostics from this hospitalization (including imaging, microbiology, ancillary and laboratory) are listed below for reference.    Significant Diagnostic Studies: NM Pulmonary Perfusion Result Date: 07/15/2024 EXAM: NM Lung Perfusion Scan. CLINICAL HISTORY: Pulmonary embolism (PE) suspected, high prob; sob, pleuritic chest pain, elevated d-dimer; remote h/o PE. Pt has been experiencing SOB that has been ongoing since Sunday. She quit smoking 6 years ago and has a hx of PE and DVT. TECHNIQUE: Radiolabeled MAA was administered intravenously and planar images of the lungs were obtained in multiple  projections. RADIOPHARMACEUTICAL: 4.4 millicurie (technetium albumin aggregated (MAA) injection solution 4.4 millicurie TECHNETIUM TO 99M ALBUMIN AGGREGATED) COMPARISON: Chest radiograph from 07/15/2024. FINDINGS: PERFUSION: No segmental perfusion defects identified. Mild non-segmental decreased radiotracer activity is identified within the apical segment of the right upper lobe. IMPRESSION: 1. No segmental perfusion defects to indicate pulmonary emboli. 2. Mild non-segmental decreased radiotracer activity within the apical segment of the right upper lobe. 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 Electronically signed by: Taylor Stroud MD 07/15/2024 04:33 PM EDT RP Workstation: [id number]   DG CHEST PORT 1 VIEW Result Date: 07/15/2024 CLINICAL DATA:  Possible community-acquired pneumonia. EXAM: PORTABLE CHEST 1 VIEW COMPARISON:  07/13/2024 FINDINGS: Lungs are adequately inflated demonstrate persistent left base/retrocardiac opacification likely slightly worse. Mild blunting of the left costophrenic angle which may represent a small amount of pleural fluid. Cardiomediastinal silhouette and remainder of the exam is unchanged. IMPRESSION: Left base/retrocardiac opacification likely slightly worse and may be due to infection versus atelectasis. Possible small amount of left pleural fluid. Electronically Signed   By: Daniel  Boyle M.D.   On: 07/15/2024 09:46   ECHOCARDIOGRAM COMPLETE Result Date: 07/14/2024 C     ECHOCARDIOGRAM REPORT   Patient Name:   Shacoya MARIE Gopal Date of Exam: 07/14/2024 Medical Rec #:  [id number]          Height:       64.0 in Accession #:    [id number]         Weight:       155.7 lb Date of Birth:  02/01/1961         BSA:          1.759 m Patient Age:    62 years           BP:           14 4/68  mmHg Patient Gender: F                  HR:           87 bpm. Exam Location:  Inpatient Procedure: 2D Echo,  Cardiac Doppler and Color Doppler (Both Spectral and Color            Flow Doppler were utilized during procedure). Indications:    Chest Pain R07.9  History:        Patient has prior history of Echocardiogram examinations, most                 recent 09/02/2019. COPD, Signs/Symptoms:Chest Pain; Risk                 Factors:Hypertension.  Sonographer:    Thea Norlander RCS Referring Phys: [id number] JUSTIN B HOWERTER IMPRESSIONS  1. Left ventricular ejection fraction, by estimation, is 60 to 65%. The left ventricle has normal function. Left ventricular endocardial border not optimally defined to evaluate regional wall motion. Left ventricular diastolic parameters are consistent with Grade I diastolic dysfunction (impaired relaxation).  2. Right ventricular systolic function is normal. The right ventricular size is normal.  3. The mitral valve is normal in structure. No evidence of mitral valve regurgitation. No evidence of mitral stenosis.  4. The aortic valve is tricuspid. Aortic valve regurgitation is trivial. Aortic valve sclerosis/calcification is present, without any evidence of aortic stenosis. Aortic valve Vmax measures 1.63 m/s.  5. A small pericardial effusion is present. The pericardial effusion is circumferential. The pericardial effusion appears to contain fibrous material.  6. Recommend repeat limited study with definity contrast for further evaluation of wall motion. Consider cardiac MRI for further visualization of the pericardium FINDINGS  Left Ventricle: Left ventricular ejection fraction, by estimation, is 60 to 65%. The left ventricle has normal function. Left ventricular endocardial border not optimally defined to evaluate regional wall motion. The left ventricular internal cavity size was normal in size. There is no left ventricular hypertrophy. Left ventricular diastolic parameters are consistent with Grade I diastolic dysfunction (impaired relaxation). Normal left ventricular filling pressure.  Right Ventricle: The right ventricular size is normal. No increase in right ventricular wall thickness. Right ventricular systolic function is normal. Left Atrium: Left atrial size was normal in size. Right Atrium: Right atrial size was normal in size. Pericardium: A small pericardial effusion is present. The pericardial effusion is circumferential. The pericardial effusion appears to contain fibrous material. Mitral Valve: The mitral valve is normal in structure. No evidence of mitral valve regurgitation. No evidence of mitral valve stenosis. Tricuspid Valve: The tricuspid valve is normal in structure. Tricuspid valve regurgitation is not demonstrated. No evidence of tricuspid stenosis. Aortic Valve: The aortic valve is tricuspid. Aortic valve regurgitation is trivial. Aortic valve sclerosis/calcification is present, without any evidence of aortic stenosis. Aortic valve peak gradient measures 10.6 mmHg. Pulmonic Valve: The pulmonic valve was normal in structure. Pulmonic valve regurgitation is not visualized. No evidence of pulmonic stenosis. Aorta: The aortic root is normal in size and structure. Venous: The inferior vena cava was not well visualized. IAS/Shunts: No atrial level shunt detected by color flow Doppler.  LEFT VENTRICLE PLAX 2D LVIDd:         4.40 cm   Diastology LVIDs:         3.10 cm   LV e' medial:    9.45 cm/s LV PW:         1.10 cm   LV E/e' medial:  6.3 LV IVS:        0.80 cm   LV e' lateral:   13.30 cm/s LVOT diam:     2.10 cm   LV E/e' lateral: 4.5 LV SV:         69 LV SV Index:   39 LVOT Area:     3.46 cm  RIGHT VENTRICLE RV S prime:     16.70 cm/s LEFT ATRIUM           Index LA diam:      3.50 cm 1.99 cm/m LA Vol (A4C): 37.7 ml 21.43 ml/m  AORTIC VALVE AV Area (Vmax): 2.45 cm AV Vmax:        163.00 cm/s AV Peak Grad:   10.6 mmHg LVOT Vmax:      115.50 cm/s LVOT Vmean:     78.350 cm/s LVOT VTI:       0.198 m  AORTA Ao Root diam: 3.40 cm Ao Asc diam:  3.50 cm MITRAL VALVE MV Area (PHT):  3.46 cm    SHUNTS MV Decel Time: 219 msec    Systemic VTI:  0.20 m MV E velocity: 59.70 cm/s  Systemic Diam: 2.10 cm MV A velocity: 83.30 cm/s MV E/A ratio:  0.72 Wilbert Bihari MD Electronically signed by Wilbert Bihari MD Signature Date/Time: 07/14/2024/5:11:12 PM    Final    CT CHEST ABDOMEN PELVIS WO CONTRAST Result Date: 07/13/2024 CLINICAL DATA:  Abdominal pain, acute, nonlocalized Acute kidney injury 4-day history of some cough some exertional shortness of breath and chest pain across the upper left and right side of the chest. Shortness make is worse with the coughing. HX COPD EXAM: CT CHEST, ABDOMEN AND PELVIS WITHOUT CONTRAST TECHNIQUE: Multidetector CT imaging of the chest, abdomen and pelvis was performed following the standard protocol without IV contrast. RADIATION DOSE REDUCTION: This exam was performed according to the departmental dose-optimization program which includes automated exposure control, adjustment of the mA and/or kV according to patient size and/or use of iterative reconstruction technique. COMPARISON:  None Available. FINDINGS: CT CHEST FINDINGS Cardiovascular: Normal heart size. Small volume pericardial effusion. The thoracic aorta is normal in caliber. No atherosclerotic plaque of the thoracic aorta. No coronary artery calcifications. Mediastinum/Nodes: No enlarged mediastinal, hilar, or axillary lymph nodes. Thyroid  gland, trachea, and esophagus demonstrate no significant findings. Lungs/Pleura: Moderate centrilobular emphysematous changes. Left lower lobe atelectasis. No pulmonary nodule. No pulmonary mass. Trace a small volume left pleural effusion. No pneumothorax. Musculoskeletal: No chest wall abnormality. No suspicious lytic or blastic osseous lesions. No acute displaced fracture. Dextroscoliosis of the thoracolumbar spine. CT ABDOMEN PELVIS FINDINGS Hepatobiliary: The hepatic parenchyma is diffusely hypodense compared to the splenic parenchyma consistent with fatty infiltration.  No focal liver abnormality. No gallstones, gallbladder wall thickening, or pericholecystic fluid. No biliary dilatation. Pancreas: No focal lesion. Normal pancreatic contour. No surrounding inflammatory changes. No main pancreatic ductal dilatation. Spleen: Normal in size without focal abnormality. Adrenals/Urinary Tract: No adrenal nodule bilaterally. No nephrolithiasis and no hydronephrosis. Fluid density lesions likely represent simple renal cysts. Simple renal cysts, in the absence of clinically indicated signs/symptoms, require no independent follow-up. No ureterolithiasis or hydroureter. The urinary bladder is unremarkable. Stomach/Bowel: Stomach is within normal limits. No evidence of bowel wall thickening or dilatation. Stool throughout the majority of the colon. Colonic diverticulosis. Appendix appears normal. Vascular/Lymphatic: No abdominal aorta or iliac aneurysm. Mild atherosclerotic plaque of the aorta and its branches. No abdominal, pelvic, or inguinal lymphadenopathy. Reproductive: Uterus and bilateral adnexa are unremarkable. Other:  No intraperitoneal free fluid. No intraperitoneal free gas. No organized fluid collection. Musculoskeletal: No abdominal wall hernia or abnormality. No suspicious lytic or blastic osseous lesions. No acute displaced fracture. Levoscoliosis of the lumbar spine centered at the L3-L4 level with associated degenerative changes. IMPRESSION: 1. Small volume pericardial effusion. 2. Left lower lobe atelectasis with superimposed infection not excluded. Limited evaluation on this noncontrast study. 3. Trace too small volume left pleural effusion. 4. Hepatic steatosis. 5. Colonic diverticulosis with no acute diverticulitis. 6. Aortic Atherosclerosis (ICD10-I70.0) and Emphysema (ICD10-J43.9). Electronically Signed   By: Morgane  Naveau M.D.   On: 07/13/2024 15:49   DG Chest 2 View Result Date: 07/13/2024 CLINICAL DATA:  chest pain EXAM: CHEST - 2 VIEW COMPARISON:  February 23, 2018  FINDINGS: Small left pleural effusion with patchy left basilar airspace opacities, likely atelectasis. Mild cardiomegaly. Tortuous aorta with aortic atherosclerosis. Moderate dextrocurvature of the thoracic spine, unchanged. No acute fracture or destructive lesions. Multilevel thoracic osteophytosis. IMPRESSION: Small left pleural effusion. Patchy left basilar airspace opacities, which may represent atelectasis or a developing bronchopneumonia, in the correct clinical context. Electronically Signed   By: Rogelia Myers M.D.   On: 07/13/2024 14:27    Microbiology: Recent Results (from the past 240 hours)  Resp panel by RT-PCR (RSV, Flu A&B, Covid) Anterior Nasal Swab     Status: None   Collection Time: 07/13/24  2:03 PM   Specimen: Anterior Nasal Swab  Result Value Ref Range Status   SARS Coronavirus 2 by RT PCR NEGATIVE NEGATIVE Final    Comment: (NOTE) SARS-CoV-2 target nucleic acids are NOT DETECTED.  The SARS-CoV-2 RNA is generally detectable in upper respiratory specimens during the acute phase of infection. The lowest concentration of SARS-CoV-2 viral copies this assay can detect is 138 copies/mL. A negative result does not preclude SARS-Cov-2 infection and should not be used as the sole basis for treatment or other patient management decisions. A negative result may occur with  improper specimen collection/handling, submission of specimen other than nasopharyngeal swab, presence of viral mutation(s) within the areas targeted by this assay, and inadequate number of viral copies(<138 copies/mL). A negative result must be combined with clinical observations, patient history, and epidemiological information. The expected result is Negative.  Fact Sheet for Patients:  BloggerCourse.com  Fact Sheet for Healthcare Providers:  SeriousBroker.it  This test is no t yet approved or cleared by the [place]  FDA and  has been authorized  for detection and/or diagnosis of SARS-CoV-2 by FDA under an Emergency Use Authorization (EUA). This EUA will remain  in effect (meaning this test can be used) for the duration of the COVID-19 declaration under Section 564(b)(1) of the Act, 21 U.S.C.section 360bbb-3(b)(1), unless the authorization is terminated  or revoked sooner.       Influenza A by PCR NEGATIVE NEGATIVE Final   Influenza B by PCR NEGATIVE NEGATIVE Final    Comment: (NOTE) The Xpert Xpress SARS-CoV-2/FLU/RSV plus assay is intended as an aid in the diagnosis of influenza from Nasopharyngeal swab specimens and should not be used as a sole basis for treatment. Nasal washings and aspirates are unacceptable for Xpert Xpress SARS-CoV-2/FLU/RSV testing.  Fact Sheet for Patients: BloggerCourse.com  Fact Sheet for Healthcare Providers: SeriousBroker.it  This test is not yet approved or cleared by the [place]  FDA and has been authorized for detection and/or diagnosis of SARS-CoV-2 by FDA under an Emergency Use Authorization (EUA). This EUA will remain in effect (meaning this test can be used) for the  duration of the COVID-19 declaration under Section 564(b)(1) of the Act, 21 U.S.C. section 360bbb-3(b)(1), unless the authorization is terminated or revoked.     Resp Syncytial Virus by PCR NEGATIVE NEGATIVE Final    Comment: (NOTE) Fact Sheet for Patients: BloggerCourse.com  Fact Sheet for Healthcare Providers: SeriousBroker.it  This test is not yet approved or cleared by the [place]  FDA and has been authorized for detection and/or diagnosis of SARS-CoV-2 by FDA under an Emergency Use Authorization (EUA). This EUA will remain in effect (meaning this test can be used) for the duration of the COVID-19 declaration under Section 564(b)(1) of the Act, 21 U.S.C. section 360bbb-3(b)(1), unless the authorization is  terminated or revoked.  Performed at [facility], [address] [place], [place] [address]   Culture, blood (Routine X 2) w Reflex to ID Panel     Status: None (Preliminary result)   Collection Time: 07/14/24 12:10 AM   Specimen: BLOOD LEFT HAND  Result Value Ref Range Status   Specimen Description BLOOD LEFT HAND  Final   Special Requests   Final    BOTTLES DRAWN AEROBIC AND ANAEROBIC Blood Culture results may not be optimal due to an inadequate volume of blood received in culture bottles   Culture   Final    NO GROWTH 3 DAYS Performed at [facility], [address], [place], [place] [address]    Report Status PENDING  Incomplete  Culture, blood (Routine X 2) w Reflex to ID Panel     Status: None (Preliminary result)   Collection Time: 07/14/24 12:11 AM   Specimen: BLOOD LEFT ARM  Result Value Ref Range Status   Specimen Description BLOOD LEFT ARM  Final   Special Requests   Final    BOTTLES DRAWN AEROBIC AND ANAEROBIC Blood Culture adequate volume   Culture   Final    NO GROWTH 3 DAYS Performed at [facility], [address] [place], [place] [address]    Report Status PENDING  Incomplete  Respiratory (~20 pathogens) panel by PCR     Status: None   Collection Time: 07/14/24  9:15 AM   Specimen: Nasopharyngeal Swab; Respiratory  Result Value Ref Range Status   Adenovirus NOT DETECTED NOT DETECTED Final   Coronavirus 229E NOT DETECTED NOT DETECTED Final    Comment: (NOTE) The Coronavirus on the Respiratory Panel, DOES NOT test for the novel  Coronavirus (2019 nCoV)    Coronavirus HKU1 NOT DETECTED NOT DETECTED Final   Coronavirus NL63 NOT DETECTED NOT DETECTED Final   Coronavirus OC43 NOT DETECTED NOT DETECTED Final   Metapneumovirus NOT DETECTED NOT DETECTED Final   Rhinovirus / Enterovirus NOT DETECTED NOT DETECTED Final   Influenza A NOT DETECTED NOT DETECTED Final   Influenza B NOT DETECTED NOT DETECTED Final   Parainfluenza  Virus 1 NOT DETECTED NOT DETECTED Final   Parainfluenza Virus 2 NOT DETECTED NOT DETECTED Final   Parainfluenza Virus 3 NOT DETECTED NOT DETECTED Final   Parainfluenza Virus 4 NOT DETECTED NOT DETECTED Final   Respiratory Syncytial Virus NOT DETECTED NOT DETECTED Final   Bordetella pertussis NOT DETECTED NOT DETECTED Final   Bordetella Parapertussis NOT DETECTED NOT DETECTED Final   Chlamydophila pneumoniae NOT DETECTED NOT DETECTED Final   Mycoplasma pneumoniae NOT DETECTED NOT DETECTED Final    Comment: Performed at [facility], [address] [place], [place] [address]     Labs: CBC: Recent Labs  Lab 07/13/24 1257 07/14/24  0515 07/15/24 0502  WBC 14.1* 10.8* 14.8*  NEUTROABS  --  6.8 11.6*  HGB 15.1* 13.9 15.8*  HCT 43.9 41.7 45.3  MCV 97.8 99.8 94.8  PLT 385 221 434*   Basic Metabolic Panel: Recent Labs  Lab 07/13/24 1257 07/14/24 0515 07/15/24 0502 07/15/24 1019 07/16/24 1000 07/17/24 1000  NA 136 133* 141  --  140  --   K 4.4 4.0 4.0  --  3.6  --   CL 97* 103 103  --  103  --   CO2 25 20* 24  --  21*  --   GLUCOSE 129* 168* 183*  --  112*  --   BUN 39* 27* 26*  --  28*  --   CREATININE 2.08* 1.35* 1.40*  --  1.49*  --   CALCIUM  10.0 8.2* 9.5  --  9.2  --   MG  --  2.1  --  2.3  --  2.1  PHOS  --  2.4*  --  2.3*  --  5.0*   Liver Function Tests: Recent Labs  Lab 07/14/24 0515 07/15/24 0502  AST 39 33  ALT 24 32  ALKPHOS 80 95  BILITOT 1.2 0.8  PROT 6.8 7.1  ALBUMIN  2.7* 2.7*   No results for input(s): LIPASE, AMYLASE in the last 168 hours. No results for input(s): AMMONIA in the last 168 hours. Cardiac Enzymes: No results for input(s): CKTOTAL, CKMB, CKMBINDEX, TROPONINI in the last 168 hours. BNP (last 3 results) No results for input(s): BNP in the last 8760 hours. CBG: No results for input(s): GLUCAP in the last 168 hours.  Time spent: 35 minutes  Signed:  Elvan Sor  [facility] 07/17/2024 12:15 PM

## 2024-07-17 NOTE — Plan of Care (Signed)
  Problem: Health Behavior/Discharge Planning: Goal: Ability to manage health-related needs will improve Outcome: Progressing   Problem: Activity: Goal: Risk for activity intolerance will decrease Outcome: Progressing   Problem: Elimination: Goal: Will not experience complications related to bowel motility Outcome: Adequate for Discharge Goal: Will not experience complications related to urinary retention Outcome: Adequate for Discharge

## 2024-07-17 NOTE — Progress Notes (Signed)
 Progress Note  Patient Name: Cheyenne Lucero Date of Encounter: 07/17/2024  Primary Cardiologist:   Jerel Balding, MD   Subjective   Feels well.  Anxious to go home.  No pain.  No SOB.   Inpatient Medications    Scheduled Meds:  aspirin  EC  81 mg Oral Daily   atorvastatin   40 mg Oral QHS   buPROPion   150 mg Oral Daily   cloNIDine   0.2 mg Oral QHS   colchicine   0.6 mg Oral Daily   [START ON 07/18/2024] vitamin B-12  1,000 mcg Oral Daily   enoxaparin  (LOVENOX ) injection  40 mg Subcutaneous QPM   ezetimibe   10 mg Oral Daily   guaiFENesin   600 mg Oral BID   levothyroxine   100 mcg Oral Q0600   losartan   50 mg Oral Daily   metoprolol  tartrate  25 mg Oral BID   mirtazapine   30 mg Oral QHS   predniSONE   50 mg Oral Q breakfast   pregabalin   100 mg Oral QHS   Continuous Infusions:  cefTRIAXone  (ROCEPHIN )  IV 1 g (07/16/24 1338)   PRN Meds: acetaminophen  **OR** acetaminophen , albuterol , benzonatate , fentaNYL  (SUBLIMAZE ) injection, hydrALAZINE , melatonin, naLOXone  (NARCAN )  injection, ondansetron  (ZOFRAN ) IV, oxyCODONE    Vital Signs    Vitals:   07/16/24 2054 07/16/24 2320 07/17/24 0436 07/17/24 0838  BP: (!) 155/97 (!) 166/81 (!) 145/80 (!) 152/72  Pulse: 64 (!) 56 62 63  Resp:  19 18 18   Temp:  98 F (36.7 C) 98 F (36.7 C) 98.2 F (36.8 C)  TempSrc:  Oral Oral Oral  SpO2:  96% 93% 96%  Weight:   69.4 kg   Height:        Intake/Output Summary (Last 24 hours) at 07/17/2024 0915 Last data filed at 07/16/2024 2054 Gross per 24 hour  Intake 480 ml  Output --  Net 480 ml   Filed Weights   07/15/24 0526 07/16/24 0421 07/17/24 0436  Weight: 69.9 kg 68.3 kg 69.4 kg    Telemetry    NSR - Personally Reviewed  ECG    NA - Personally Reviewed  Physical Exam   GEN: No  acute distress.   Neck: No  JVD Cardiac: RRR, no murmurs, rubs, or gallops.  Respiratory: Clear   to auscultation bilaterally. GI: Soft, nontender, non-distended, normal bowel sounds   MS:  No edema; No deformity. Neuro:   Nonfocal  Psych: Oriented and appropriate    Labs    Chemistry Recent Labs  Lab 07/14/24 0515 07/15/24 0502 07/16/24 1000  NA 133* 141 140  K 4.0 4.0 3.6  CL 103 103 103  CO2 20* 24 21*  GLUCOSE 168* 183* 112*  BUN 27* 26* 28*  CREATININE 1.35* 1.40* 1.49*  CALCIUM  8.2* 9.5 9.2  PROT 6.8 7.1  --   ALBUMIN  2.7* 2.7*  --   AST 39 33  --   ALT 24 32  --   ALKPHOS 80 95  --   BILITOT 1.2 0.8  --   GFRNONAA 44* 42* 39*  ANIONGAP 10 14 16*     Hematology Recent Labs  Lab 07/13/24 1257 07/14/24 0515 07/15/24 0502  WBC 14.1* 10.8* 14.8*  RBC 4.49 4.18 4.78  HGB 15.1* 13.9 15.8*  HCT 43.9 41.7 45.3  MCV 97.8 99.8 94.8  MCH 33.6 33.3 33.1  MCHC 34.4 33.3 34.9  RDW 14.1 14.2 13.8  PLT 385 221 434*    Cardiac EnzymesNo results for input(s): TROPONINI  in the last 168 hours. No results for input(s): TROPIPOC in the last 168 hours.   BNPNo results for input(s): BNP, PROBNP in the last 168 hours.   DDimer  Recent Labs  Lab 07/13/24 1300  DDIMER 3.89*     Radiology    NM Pulmonary Perfusion Result Date: 07/15/2024 EXAM: NM Lung Perfusion Scan. CLINICAL HISTORY: Pulmonary embolism (PE) suspected, high prob; sob, pleuritic chest pain, elevated d-dimer; remote h/o PE. Pt has been experiencing SOB that has been ongoing since Sunday. She quit smoking 6 years ago and has a hx of PE and DVT. TECHNIQUE: Radiolabeled MAA was administered intravenously and planar images of the lungs were obtained in multiple projections. RADIOPHARMACEUTICAL: 4.4 millicurie (technetium albumin aggregated (MAA) injection solution 4.4 millicurie TECHNETIUM TO 99M ALBUMIN AGGREGATED) COMPARISON: Chest radiograph from 07/15/2024. FINDINGS: PERFUSION: No segmental perfusion defects identified. Mild non-segmental decreased radiotracer activity is identified within the apical segment of the right upper lobe. IMPRESSION: 1. No segmental perfusion defects to  indicate pulmonary emboli. 2. Mild non-segmental decreased radiotracer activity within the apical segment of the right upper lobe. 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 Electronically signed by: Taylor Stroud MD 07/15/2024 04:33 PM EDT RP Workstation: [id number]    Cardiac Studies   Echo see above.   Patient Profile     62  y.o. female with a hx of COPD, history of PE (2003), hypothyroidism 2/2 treatment of Graves disease, hypertension, polycythemia secondary to OSA, who is being seen 07/15/2024 for the evaluation of chest pain, elevated troponin, abnormal echo finding at the request of Dr. Von.   Assessment & Plan    Elevated troponin:    Chest pain thought to be non anginal and related possibly to parapneumonic pericarditis.   Small pericardial effusion noted. Continue long term colchicine  once daily given renal insufficiency.   Plan 6 weeks to three months.  We can follow markers and symptoms as an out patient.  Follow up and echo ordered.  She will have repeat echo as an out patient.  Prednisone  taper per primary team.  No NSAIDs.    HTN:    She has had bradycardia on higher doses of beta blocker.   She has had orthostasis on alpha blockers.  She does not have an allergy to Norvasc .  I will start a low dose.  She can have her clonidine  changed at home per Dr. Francyne.    CKD IIIa:    Creat trending up.  I will continue Cozaar  which is at a lower dose than previous but follow creat closely.    Dyslipidemia :  Started on Lipitor.      For questions or updates, please contact [facility] Please consult [email or web address] for contact info under Cardiology/STEMI.   Signed, Lynwood Schilling, MD  07/17/2024, 9:15 AM

## 2024-07-19 LAB — CULTURE, BLOOD (ROUTINE X 2)
Culture: NO GROWTH
Culture: NO GROWTH
Special Requests: ADEQUATE

## 2024-07-26 ENCOUNTER — Emergency Department (HOSPITAL_BASED_OUTPATIENT_CLINIC_OR_DEPARTMENT_OTHER): Admitting: Radiology

## 2024-07-26 ENCOUNTER — Encounter (HOSPITAL_BASED_OUTPATIENT_CLINIC_OR_DEPARTMENT_OTHER): Payer: Self-pay

## 2024-07-26 ENCOUNTER — Inpatient Hospital Stay (HOSPITAL_BASED_OUTPATIENT_CLINIC_OR_DEPARTMENT_OTHER)
Admission: EM | Admit: 2024-07-26 | Discharge: 2024-08-01 | DRG: 190 | Disposition: A | Discharge status: Home / Self Care | Source: Ambulatory Visit | Attending: Internal Medicine | Admitting: Internal Medicine

## 2024-07-26 ENCOUNTER — Other Ambulatory Visit: Payer: Self-pay

## 2024-07-26 ENCOUNTER — Emergency Department (HOSPITAL_BASED_OUTPATIENT_CLINIC_OR_DEPARTMENT_OTHER)

## 2024-07-26 DIAGNOSIS — J440 Chronic obstructive pulmonary disease with acute lower respiratory infection: Principal | ICD-10-CM | POA: Diagnosis present

## 2024-07-26 DIAGNOSIS — H539 Unspecified visual disturbance: Secondary | ICD-10-CM | POA: Diagnosis not present

## 2024-07-26 DIAGNOSIS — Z8701 Personal history of pneumonia (recurrent): Secondary | ICD-10-CM

## 2024-07-26 DIAGNOSIS — Z8269 Family history of other diseases of the musculoskeletal system and connective tissue: Secondary | ICD-10-CM

## 2024-07-26 DIAGNOSIS — Z8679 Personal history of other diseases of the circulatory system: Secondary | ICD-10-CM

## 2024-07-26 DIAGNOSIS — G629 Polyneuropathy, unspecified: Secondary | ICD-10-CM | POA: Diagnosis present

## 2024-07-26 DIAGNOSIS — Z8709 Personal history of other diseases of the respiratory system: Secondary | ICD-10-CM | POA: Diagnosis not present

## 2024-07-26 DIAGNOSIS — I10 Essential (primary) hypertension: Secondary | ICD-10-CM | POA: Diagnosis present

## 2024-07-26 DIAGNOSIS — I2489 Other forms of acute ischemic heart disease: Secondary | ICD-10-CM | POA: Diagnosis present

## 2024-07-26 DIAGNOSIS — J90 Pleural effusion, not elsewhere classified: Principal | ICD-10-CM

## 2024-07-26 DIAGNOSIS — Z86711 Personal history of pulmonary embolism: Secondary | ICD-10-CM | POA: Diagnosis not present

## 2024-07-26 DIAGNOSIS — J189 Pneumonia, unspecified organism: Secondary | ICD-10-CM | POA: Diagnosis not present

## 2024-07-26 DIAGNOSIS — J151 Pneumonia due to Pseudomonas: Secondary | ICD-10-CM | POA: Diagnosis present

## 2024-07-26 DIAGNOSIS — D72829 Elevated white blood cell count, unspecified: Secondary | ICD-10-CM

## 2024-07-26 DIAGNOSIS — I160 Hypertensive urgency: Secondary | ICD-10-CM | POA: Diagnosis present

## 2024-07-26 DIAGNOSIS — I129 Hypertensive chronic kidney disease with stage 1 through stage 4 chronic kidney disease, or unspecified chronic kidney disease: Secondary | ICD-10-CM | POA: Diagnosis present

## 2024-07-26 DIAGNOSIS — E7849 Other hyperlipidemia: Secondary | ICD-10-CM | POA: Diagnosis not present

## 2024-07-26 DIAGNOSIS — H538 Other visual disturbances: Secondary | ICD-10-CM | POA: Diagnosis not present

## 2024-07-26 DIAGNOSIS — Z7982 Long term (current) use of aspirin: Secondary | ICD-10-CM | POA: Diagnosis not present

## 2024-07-26 DIAGNOSIS — R7989 Other specified abnormal findings of blood chemistry: Secondary | ICD-10-CM | POA: Diagnosis not present

## 2024-07-26 DIAGNOSIS — Z885 Allergy status to narcotic agent status: Secondary | ICD-10-CM | POA: Diagnosis not present

## 2024-07-26 DIAGNOSIS — F411 Generalized anxiety disorder: Secondary | ICD-10-CM | POA: Diagnosis present

## 2024-07-26 DIAGNOSIS — N1831 Chronic kidney disease, stage 3a: Secondary | ICD-10-CM | POA: Diagnosis present

## 2024-07-26 DIAGNOSIS — Z79890 Hormone replacement therapy (postmenopausal): Secondary | ICD-10-CM | POA: Diagnosis not present

## 2024-07-26 DIAGNOSIS — I2510 Atherosclerotic heart disease of native coronary artery without angina pectoris: Secondary | ICD-10-CM | POA: Diagnosis present

## 2024-07-26 DIAGNOSIS — Z79899 Other long term (current) drug therapy: Secondary | ICD-10-CM

## 2024-07-26 DIAGNOSIS — E785 Hyperlipidemia, unspecified: Secondary | ICD-10-CM | POA: Diagnosis present

## 2024-07-26 DIAGNOSIS — E039 Hypothyroidism, unspecified: Secondary | ICD-10-CM | POA: Diagnosis present

## 2024-07-26 DIAGNOSIS — G4730 Sleep apnea, unspecified: Secondary | ICD-10-CM | POA: Diagnosis present

## 2024-07-26 DIAGNOSIS — Z87891 Personal history of nicotine dependence: Secondary | ICD-10-CM

## 2024-07-26 DIAGNOSIS — E0590 Thyrotoxicosis, unspecified without thyrotoxic crisis or storm: Secondary | ICD-10-CM | POA: Diagnosis not present

## 2024-07-26 DIAGNOSIS — I318 Other specified diseases of pericardium: Secondary | ICD-10-CM | POA: Diagnosis present

## 2024-07-26 DIAGNOSIS — J9601 Acute respiratory failure with hypoxia: Secondary | ICD-10-CM

## 2024-07-26 DIAGNOSIS — R0602 Shortness of breath: Secondary | ICD-10-CM | POA: Diagnosis present

## 2024-07-26 LAB — CBC
HCT: 42.3 % (ref 36.0–46.0)
Hemoglobin: 14.8 g/dL (ref 12.0–15.0)
MCH: 33.3 pg (ref 26.0–34.0)
MCHC: 35 g/dL (ref 30.0–36.0)
MCV: 95.1 fL (ref 80.0–100.0)
Platelets: 488 K/uL — ABNORMAL HIGH (ref 150–400)
RBC: 4.45 MIL/uL (ref 3.87–5.11)
RDW: 13.7 % (ref 11.5–15.5)
WBC: 20.6 K/uL — ABNORMAL HIGH (ref 4.0–10.5)
nRBC: 0 % (ref 0.0–0.2)

## 2024-07-26 LAB — BASIC METABOLIC PANEL WITH GFR
Anion gap: 19 — ABNORMAL HIGH (ref 5–15)
BUN: 26 mg/dL — ABNORMAL HIGH (ref 8–23)
CO2: 23 mmol/L (ref 22–32)
Calcium: 9.7 mg/dL (ref 8.9–10.3)
Chloride: 91 mmol/L — ABNORMAL LOW (ref 98–111)
Creatinine, Ser: 1.99 mg/dL — ABNORMAL HIGH (ref 0.44–1.00)
GFR, Estimated: 28 mL/min — ABNORMAL LOW (ref >60)
Glucose, Bld: 107 mg/dL — ABNORMAL HIGH (ref 70–99)
Potassium: 3.4 mmol/L — ABNORMAL LOW (ref 3.5–5.1)
Sodium: 133 mmol/L — ABNORMAL LOW (ref 135–145)

## 2024-07-26 LAB — LACTIC ACID, PLASMA: Lactic Acid, Venous: 1.8 mmol/L (ref 0.5–1.9)

## 2024-07-26 LAB — DIFFERENTIAL
Abs Immature Granulocytes: 0.15 K/uL — ABNORMAL HIGH (ref 0.00–0.07)
Basophils Absolute: 0.1 K/uL (ref 0.0–0.1)
Basophils Relative: 0 %
Eosinophils Absolute: 0.1 K/uL (ref 0.0–0.5)
Eosinophils Relative: 0 %
Immature Granulocytes: 1 %
Lymphocytes Relative: 20 %
Lymphs Abs: 4.3 K/uL — ABNORMAL HIGH (ref 0.7–4.0)
Monocytes Absolute: 1.8 K/uL — ABNORMAL HIGH (ref 0.1–1.0)
Monocytes Relative: 9 %
Neutro Abs: 14.7 K/uL — ABNORMAL HIGH (ref 1.7–7.7)
Neutrophils Relative %: 70 %

## 2024-07-26 LAB — TROPONIN T, HIGH SENSITIVITY
Troponin T High Sensitivity: 32 ng/L — ABNORMAL HIGH (ref 0–19)
Troponin T High Sensitivity: 29 ng/L — ABNORMAL HIGH (ref 0–19)

## 2024-07-26 MED ORDER — VANCOMYCIN HCL IN DEXTROSE 1-5 GM/200ML-% IV SOLN
1000.0000 mg | Freq: Once | INTRAVENOUS | Status: AC
  Administered 2024-07-26: 1000 mg via INTRAVENOUS
  Filled 2024-07-26: qty 200

## 2024-07-26 MED ORDER — LACTATED RINGERS IV BOLUS
1000.0000 mL | Freq: Once | INTRAVENOUS | Status: AC
  Administered 2024-07-26: 1000 mL via INTRAVENOUS

## 2024-07-26 MED ORDER — ACETAMINOPHEN 325 MG PO TABS
ORAL_TABLET | ORAL | Status: AC
Start: 2024-07-26 — End: 2024-07-27
  Filled 2024-07-26: qty 2

## 2024-07-26 MED ORDER — SODIUM CHLORIDE 0.9 % IV SOLN
2.0000 g | Freq: Once | INTRAVENOUS | Status: AC
  Administered 2024-07-26: 2 g via INTRAVENOUS
  Filled 2024-07-26: qty 12.5

## 2024-07-26 MED ORDER — ACETAMINOPHEN 325 MG PO TABS
650.0000 mg | ORAL_TABLET | Freq: Once | ORAL | Status: AC
  Administered 2024-07-26: 650 mg via ORAL

## 2024-07-26 NOTE — Plan of Care (Signed)
 Plan of Care Note for accepted transfer  Patient: Cheyenne Lucero    FMW:[id number]  DOA: 07/26/2024     Facility requesting transfer: [facility]  Requesting Provider: Zackowski, Scott, MD  Reason for transfer: CAP  Facility course:   30 M with hx of COPD, PE not on AC, acquired hypothyroidism, essential hypertension, HLD, CKD3a, depression, recent admission 8/6-8/10 treated for CAP / COPD exacerbation. Seen by PCP with persistent SOB, chest pain. and noted to have high WBC, low normal BP referred to ED. Concern for ongoing / relapsed CAP. CT Chest with question of LLL / lingular pna v atelectasis and small L pleural effusion, underlying mass not excluded. Treated with Vanc / Cefepime , 1 L IVF.   Plan of care: The patient is accepted for admission to Telemetry unit, at [facility]. Or [facility]     Author: Dorn Dawson, MD  07/26/2024  Check [email or web address] for on-call coverage.  Nursing staff, Please call [facility] Admits & Consults System-Wide number on Amion as soon as patient's arrival, so appropriate admitting provider can evaluate the pt.

## 2024-07-26 NOTE — ED Triage Notes (Signed)
 Pt POV reporting CP/SOB that began today and elevated white count per PCP, recently admitted 8/6 for sepsis and pneumonia, advised to come to ED for evaluation.

## 2024-07-26 NOTE — ED Notes (Signed)
 Family at bedside.

## 2024-07-26 NOTE — ED Notes (Signed)
 Pt sp02 keeps dropping to 88%. RT made aware.Placed on  2L [place]

## 2024-07-26 NOTE — ED Provider Notes (Signed)
 [place] EMERGENCY DEPARTMENT AT [facility] Provider Note   CSN: [id number] Arrival date & time: 07/26/24  1704     Patient presents with: Chest Pain and Shortness of Breath   Cheyenne Lucero is a 63 y.o. female.   Patient seen by primary care doctor yesterday.  Had a labs with markedly elevated white blood cell count.  Today patient's blood pressure was little low she did not take her blood pressure medicine.  But it was still above 100.  Primary care doctor recommended she come in for further evaluation because she was recently admitted.  Patient feeling a little foggy in the head today as well.  Patient was seen by me on August 6 which led to the admission.  And during that admission principal problem with COPD exacerbation but she had acute hypoxic respiratory failure severe sepsis community-acquired pneumonia.  Elevated troponins as well which was part of the reason why I admitted her.  Patient reporting chest pain shortness of breath began today elevated white count.  As stated patient was admitted August 6 through August 10.  Was discharged home on Augmentin .  Finished that up about 3 days ago.  [facility] care doctor was concerned about reoccurring sepsis or worsening pneumonia.       Prior to Admission medications   Medication Sig Start Date End Date Taking? Authorizing Provider  acetaminophen  (TYLENOL ) 325 MG tablet Take 650 mg by mouth every 6 (six) hours as needed for mild pain.    [provider]  amLODipine  (NORVASC ) 2.5 MG tablet Take 1 tablet (2.5 mg total) by mouth daily. 07/17/24 10/15/24  Von Bellis, MD  aspirin  EC 81 MG tablet Take 81 mg by mouth daily. Swallow whole.    [provider]  atorvastatin  (LIPITOR) 40 MG tablet Take 1 tablet (40 mg total) by mouth at bedtime. 07/17/24 07/17/25  Von Bellis, MD  buPROPion  (WELLBUTRIN  XL) 150 MG 24 hr tablet Take 150 mg by mouth daily.    [provider]  Cholecalciferol  (VITAMIN D3) 2000  units TABS Take 1 tablet by mouth daily.    [provider]  cloNIDine  (CATAPRES ) 0.2 MG tablet Take 1.5 tablets (0.3 mg total) by mouth at bedtime, THEN 1 tablet (0.2 mg total) at bedtime, THEN 0.5 tablets (0.1 mg total) at bedtime. 07/17/24   Von Bellis, MD  colchicine  0.6 MG tablet Take 1 tablet (0.6 mg total) by mouth daily. 07/18/24 10/16/24  Von Bellis, MD  cyanocobalamin  1000 MCG tablet Take 1 tablet (1,000 mcg total) by mouth daily. 07/18/24 01/14/25  Von Bellis, MD  ezetimibe  (ZETIA ) 10 MG tablet Take 10 mg by mouth daily. 04/25/24   [provider]  guaiFENesin -dextromethorphan (ROBITUSSIN DM) 100-10 MG/5ML syrup Take 10 mLs by mouth every 6 (six) hours as needed for cough. 07/17/24   Von Bellis, MD  levothyroxine  (SYNTHROID ) 100 MCG tablet Take 100 mcg by mouth daily before breakfast.    [provider]  losartan  (COZAAR ) 100 MG tablet Take 1 tablet by mouth daily. 03/13/21   [provider]  metoprolol  tartrate (LOPRESSOR ) 50 MG tablet Take 25 mg by mouth 2 (two) times daily. 04/25/24   [provider]  mirtazapine  (REMERON ) 30 MG tablet Take 30 mg by mouth at bedtime.    [provider]  pregabalin  (LYRICA ) 50 MG capsule Take 100 mg by mouth at bedtime. 04/25/24   [provider]    Allergies: Morphine and codeine and Tramadol     Review of Systems  Constitutional:  Negative for chills and fever.  HENT:  Negative for ear pain and sore throat.   Eyes:  Negative for pain and visual disturbance.  Respiratory:  Positive for shortness of breath. Negative for cough.   Cardiovascular:  Positive for chest pain. Negative for palpitations.  Gastrointestinal:  Negative for abdominal pain and vomiting.  Genitourinary:  Negative for dysuria and hematuria.  Musculoskeletal:  Negative for arthralgias and back pain.  Skin:  Negative for color change and rash.  Neurological:  Negative for seizures and syncope.  All other systems  reviewed and are negative.   Updated Vital Signs BP (!) 154/85   Pulse 82   Temp 98 F (36.7 C) (Oral)   Resp (!) 22   Ht 1.626 m (5' 4)   Wt 68.9 kg   SpO2 94%   BMI 26.09 kg/m   Physical Exam Vitals and nursing note reviewed.  Constitutional:      General: She is not in acute distress.    Appearance: Normal appearance. She is well-developed. She is not ill-appearing.  HENT:     Head: Normocephalic and atraumatic.     Mouth/Throat:     Mouth: Mucous membranes are moist.  Eyes:     Extraocular Movements: Extraocular movements intact.     Conjunctiva/sclera: Conjunctivae normal.     Pupils: Pupils are equal, round, and reactive to light.  Cardiovascular:     Rate and Rhythm: Normal rate and regular rhythm.     Heart sounds: No murmur heard. Pulmonary:     Effort: Pulmonary effort is normal. No respiratory distress.     Breath sounds: Normal breath sounds. No wheezing or rales.  Abdominal:     Palpations: Abdomen is soft.     Tenderness: There is no abdominal tenderness.  Musculoskeletal:        General: No swelling.     Cervical back: Normal range of motion and neck supple.  Skin:    General: Skin is warm and dry.     Capillary Refill: Capillary refill takes less than 2 seconds.  Neurological:     General: No focal deficit present.     Mental Status: She is alert and oriented to person, place, and time.  Psychiatric:        Mood and Affect: Mood normal.     (all labs ordered are listed, but only abnormal results are displayed) Labs Reviewed  BASIC METABOLIC PANEL WITH GFR - Abnormal; Notable for the following components:      Result Value   Sodium 133 (*)    Potassium 3.4 (*)    Chloride 91 (*)    Glucose, Bld 107 (*)    BUN 26 (*)    Creatinine, Ser 1.99 (*)    GFR, Estimated 28 (*)    Anion gap 19 (*)    All other components within normal limits  CBC - Abnormal; Notable for the following components:   WBC 20.6 (*)    Platelets 488 (*)    All other  components within normal limits  TROPONIN T, HIGH SENSITIVITY - Abnormal; Notable for the following components:   Troponin T High Sensitivity 32 (*)    All other components within normal limits  TROPONIN T, HIGH SENSITIVITY - Abnormal; Notable for the following components:   Troponin T High Sensitivity 29 (*)    All other components within normal limits  CULTURE, BLOOD (ROUTINE X 2)  CULTURE, BLOOD (ROUTINE X 2)  LACTIC ACID, PLASMA  EKG: EKG Interpretation Date/Time:  Tuesday July 26 2024 17:12:55 EDT Ventricular Rate:  83 PR Interval:  163 QRS Duration:  90 QT Interval:  396 QTC Calculation: 466 R Axis:   91  Text Interpretation: Sinus rhythm Right axis deviation Borderline abnrm T, anterolateral leads Minimal ST elevation, inferior leads Artifact No significant change since last tracing Confirmed by Shoua Ressler [phone]) on 07/26/2024 6:35:02 PM  Radiology: CT Chest Wo Contrast Result Date: 07/26/2024 CLINICAL DATA:  Pneumonia. EXAM: CT CHEST WITHOUT CONTRAST TECHNIQUE: Multidetector CT imaging of the chest was performed following the standard protocol without IV contrast. RADIATION DOSE REDUCTION: This exam was performed according to the departmental dose-optimization program which includes automated exposure control, adjustment of the mA and/or kV according to patient size and/or use of iterative reconstruction technique. COMPARISON:  Chest CT dated 07/13/2024. FINDINGS: Evaluation of this exam is limited in the absence of intravenous contrast. Cardiovascular: There is no cardiomegaly. Small pericardial effusion. Mild atherosclerotic calcification of the thoracic aorta. No aneurysmal dilatation. The central pulmonary arteries are grossly unremarkable. Mediastinum/Nodes: No hernia or or mediastinal adenopathy. The esophagus is grossly unremarkable. No mediastinal fluid collection. Lungs/Pleura: Similar size small left pleural effusion. There is partial compressive atelectasis of  the left lower lobe and lingula similar or slightly progressed since the prior CT. Pneumonia or underlying mass is not excluded. Continued follow-up to resolution recommended. There is background of centrilobular emphysema. No pneumothorax. The central airways are patent. Upper Abdomen: No acute abnormality.  Fatty liver. Musculoskeletal: Degenerative changes of the spine and scoliosis. No acute osseous pathology. IMPRESSION: 1. Similar or slightly progressed left pleural effusion and left lower lobe and lingular atelectasis. Pneumonia or underlying mass is not excluded. Continued follow-up to resolution recommended. 2. Aortic Atherosclerosis (ICD10-I70.0) and Emphysema (ICD10-J43.9). Electronically Signed   By: Vanetta Chou M.D.   On: 07/26/2024 19:09   DG Chest 2 View Result Date: 07/26/2024 CLINICAL DATA:  Chest pain EXAM: CHEST - 2 VIEW COMPARISON:  Chest x-ray 07/15/2024 FINDINGS: There is a new moderate-sized left pleural effusion. Can not exclude underlying left basilar atelectasis/airspace disease. The heart is enlarged, unchanged. Right lung is clear. Aorta is ectatic. There is no pneumothorax or acute fracture. IMPRESSION: New moderate-sized left pleural effusion. Can not exclude underlying left basilar atelectasis/airspace disease. Electronically Signed   By: Greig Pique M.D.   On: 07/26/2024 17:45     Procedures   Medications Ordered in the ED  lactated ringers  bolus 1,000 mL (1,000 mLs Intravenous New Bag/Given 07/26/24 1848)  vancomycin  (VANCOCIN ) IVPB 1000 mg/200 mL premix (1,000 mg Intravenous New Bag/Given 07/26/24 1954)  ceFEPIme  (MAXIPIME ) 2 g in sodium chloride  0.9 % 100 mL IVPB (0 g Intravenous Stopped 07/26/24 2036)                                    Medical Decision Making Amount and/or Complexity of Data Reviewed Labs: ordered. Radiology: ordered.  Risk Prescription drug management.   Patient with a white count of 30,000 yesterday at doctor's office.  Here today  white count is 20,000.  Differential was not done we will add that on.  Platelets 488 hemoglobin good 14.8 basic metabolic panel sodium down a little bit at 133 potassium 3.4 for GFR of 18 and a creatinine of 1.99 anion gap of 19 CO2 was normal at 23 initial troponin was elevated at 32 will need delta troponins  Chest x-ray shows  new moderate size left pleural effusion cannot exclude underlying atelectasis or airspace disease.  EKG without significant changes since August 6.  Lactic acid is reassuring at 1.8.  So not elevated.  Patient's renal function with GFR 28 is significantly worse than she was discharged from the hospital on August 9 and GFR was 39 but when she was admitted on August 6 GFR was 26.  Potassium down a little bit at 3.4 sodium down some at 133 but none of those are critical.  Will give patient a 500 cc bolus of lactated Ringer 's.  So far has not met sepsis criteria.  Based on the moderate pleural effusion on the chest x-ray we will go ahead and CT without IV contrast.  And will need delta troponin.  Troponin is 29.  Initial troponin was 32.  Will discuss with hospitalist for readmission for worsening pneumonia.  Patient stated clinically that she did not feel a lot worse but does not feel any better.  The white count is definitely going up.  And the chest x-ray showing bigger pleural effusion and unable to differentiate what is going on behind it.  Patient is not hypoxic.  Final diagnoses:  Pleural effusion  Leukocytosis, unspecified type  Community acquired pneumonia of left lower lobe of lung    ED Discharge Orders          Ordered    Differential        07/26/24 1840               Geraldene Hamilton, MD 07/26/24 2057

## 2024-07-27 ENCOUNTER — Encounter (HOSPITAL_COMMUNITY): Payer: Self-pay | Admitting: Internal Medicine

## 2024-07-27 DIAGNOSIS — Z86711 Personal history of pulmonary embolism: Secondary | ICD-10-CM | POA: Insufficient documentation

## 2024-07-27 DIAGNOSIS — E785 Hyperlipidemia, unspecified: Secondary | ICD-10-CM | POA: Insufficient documentation

## 2024-07-27 DIAGNOSIS — J189 Pneumonia, unspecified organism: Secondary | ICD-10-CM | POA: Diagnosis not present

## 2024-07-27 DIAGNOSIS — I10 Essential (primary) hypertension: Secondary | ICD-10-CM | POA: Diagnosis not present

## 2024-07-27 DIAGNOSIS — E0590 Thyrotoxicosis, unspecified without thyrotoxic crisis or storm: Secondary | ICD-10-CM

## 2024-07-27 DIAGNOSIS — R7989 Other specified abnormal findings of blood chemistry: Secondary | ICD-10-CM | POA: Diagnosis not present

## 2024-07-27 DIAGNOSIS — E7849 Other hyperlipidemia: Secondary | ICD-10-CM

## 2024-07-27 DIAGNOSIS — Z8709 Personal history of other diseases of the respiratory system: Secondary | ICD-10-CM

## 2024-07-27 DIAGNOSIS — Z8679 Personal history of other diseases of the circulatory system: Secondary | ICD-10-CM

## 2024-07-27 LAB — COMPREHENSIVE METABOLIC PANEL WITH GFR
ALT: 24 U/L (ref 0–44)
AST: 21 U/L (ref 15–41)
Albumin: 2.7 g/dL — ABNORMAL LOW (ref 3.5–5.0)
Alkaline Phosphatase: 87 U/L (ref 38–126)
Anion gap: 15 (ref 5–15)
BUN: 21 mg/dL (ref 8–23)
CO2: 25 mmol/L (ref 22–32)
Calcium: 8.7 mg/dL — ABNORMAL LOW (ref 8.9–10.3)
Chloride: 99 mmol/L (ref 98–111)
Creatinine, Ser: 1.68 mg/dL — ABNORMAL HIGH (ref 0.44–1.00)
GFR, Estimated: 34 mL/min — ABNORMAL LOW (ref >60)
Glucose, Bld: 109 mg/dL — ABNORMAL HIGH (ref 70–99)
Potassium: 3.8 mmol/L (ref 3.5–5.1)
Sodium: 139 mmol/L (ref 135–145)
Total Bilirubin: 1.1 mg/dL (ref 0.0–1.2)
Total Protein: 6.6 g/dL (ref 6.5–8.1)

## 2024-07-27 LAB — CBC
HCT: 39.3 % (ref 36.0–46.0)
Hemoglobin: 13.8 g/dL (ref 12.0–15.0)
MCH: 33.3 pg (ref 26.0–34.0)
MCHC: 35.1 g/dL (ref 30.0–36.0)
MCV: 94.9 fL (ref 80.0–100.0)
Platelets: 407 K/uL — ABNORMAL HIGH (ref 150–400)
RBC: 4.14 MIL/uL (ref 3.87–5.11)
RDW: 13.6 % (ref 11.5–15.5)
WBC: 14.2 K/uL — ABNORMAL HIGH (ref 4.0–10.5)
nRBC: 0 % (ref 0.0–0.2)

## 2024-07-27 LAB — STREP PNEUMONIAE URINARY ANTIGEN: Strep Pneumo Urinary Antigen: NEGATIVE

## 2024-07-27 LAB — EXPECTORATED SPUTUM ASSESSMENT W GRAM STAIN, RFLX TO RESP C

## 2024-07-27 LAB — PROCALCITONIN: Procalcitonin: 0.24 ng/mL

## 2024-07-27 LAB — MRSA NEXT GEN BY PCR, NASAL: MRSA by PCR Next Gen: NOT DETECTED

## 2024-07-27 MED ORDER — EZETIMIBE 10 MG PO TABS
10.0000 mg | ORAL_TABLET | Freq: Every day | ORAL | Status: DC
  Administered 2024-07-27 – 2024-08-01 (×6): 10 mg via ORAL
  Filled 2024-07-27 (×7): qty 1

## 2024-07-27 MED ORDER — CLONIDINE HCL 0.1 MG PO TABS
0.1000 mg | ORAL_TABLET | Freq: Every day | ORAL | Status: DC
  Administered 2024-07-30 – 2024-07-31 (×2): 0.1 mg via ORAL
  Filled 2024-07-27 (×4): qty 1

## 2024-07-27 MED ORDER — VANCOMYCIN HCL 750 MG/150ML IV SOLN
750.0000 mg | INTRAVENOUS | Status: DC
  Administered 2024-07-27 – 2024-07-29 (×3): 750 mg via INTRAVENOUS
  Filled 2024-07-27 (×9): qty 150

## 2024-07-27 MED ORDER — HEPARIN SODIUM (PORCINE) 5000 UNIT/ML IJ SOLN
5000.0000 [IU] | Freq: Three times a day (TID) | INTRAMUSCULAR | Status: DC
  Administered 2024-07-27 (×3): 5000 [IU] via SUBCUTANEOUS
  Filled 2024-07-27 (×4): qty 1

## 2024-07-27 MED ORDER — METOPROLOL TARTRATE 25 MG PO TABS
25.0000 mg | ORAL_TABLET | Freq: Two times a day (BID) | ORAL | Status: DC
  Administered 2024-07-27 – 2024-08-01 (×12): 25 mg via ORAL
  Filled 2024-07-27 (×14): qty 1

## 2024-07-27 MED ORDER — IPRATROPIUM-ALBUTEROL 0.5-2.5 (3) MG/3ML IN SOLN
3.0000 mL | Freq: Four times a day (QID) | RESPIRATORY_TRACT | Status: DC | PRN
  Filled 2024-07-27 (×4): qty 3

## 2024-07-27 MED ORDER — CLONIDINE HCL 0.1 MG PO TABS: 0.2000 mg | ORAL_TABLET | Freq: Every day | ORAL | Status: DC

## 2024-07-27 MED ORDER — CLONIDINE HCL 0.2 MG PO TABS
0.2000 mg | ORAL_TABLET | Freq: Every day | ORAL | Status: AC
  Administered 2024-07-27 – 2024-07-29 (×3): 0.2 mg via ORAL
  Filled 2024-07-27: qty 2
  Filled 2024-07-27: qty 1
  Filled 2024-07-27: qty 2

## 2024-07-27 MED ORDER — SODIUM CHLORIDE 0.9 % IV SOLN
250.0000 mL | INTRAVENOUS | Status: AC | PRN
Start: 2024-07-27 — End: 2024-07-28

## 2024-07-27 MED ORDER — ACETAMINOPHEN 650 MG RE SUPP
650.0000 mg | Freq: Four times a day (QID) | RECTAL | Status: DC | PRN
  Filled 2024-07-27 (×5): qty 1

## 2024-07-27 MED ORDER — CLONIDINE HCL 0.1 MG PO TABS: 0.1000 mg | ORAL_TABLET | Freq: Every day | ORAL | Status: DC

## 2024-07-27 MED ORDER — ORAL CARE MOUTH RINSE: 15.0000 mL | OROMUCOSAL | Status: DC | PRN

## 2024-07-27 MED ORDER — IBUPROFEN 200 MG PO TABS
400.0000 mg | ORAL_TABLET | Freq: Four times a day (QID) | ORAL | Status: DC | PRN
  Administered 2024-07-27: 400 mg via ORAL
  Filled 2024-07-27: qty 2

## 2024-07-27 MED ORDER — SODIUM CHLORIDE 0.9 % IV SOLN: 2.0000 g | INTRAVENOUS | Status: DC

## 2024-07-27 MED ORDER — SODIUM CHLORIDE 0.9% FLUSH
3.0000 mL | Freq: Two times a day (BID) | INTRAVENOUS | Status: DC
  Administered 2024-07-27 (×2): 3 mL via INTRAVENOUS

## 2024-07-27 MED ORDER — GUAIFENESIN ER 600 MG PO TB12
600.0000 mg | ORAL_TABLET | Freq: Two times a day (BID) | ORAL | Status: DC | PRN
  Filled 2024-07-27 (×2): qty 1

## 2024-07-27 MED ORDER — VANCOMYCIN HCL 500 MG/100ML IV SOLN
500.0000 mg | Freq: Once | INTRAVENOUS | Status: AC
  Administered 2024-07-27: 500 mg via INTRAVENOUS
  Filled 2024-07-27: qty 100

## 2024-07-27 MED ORDER — MIRTAZAPINE 30 MG PO TABS
30.0000 mg | ORAL_TABLET | Freq: Every day | ORAL | Status: DC
Start: 2024-07-27 — End: 2024-08-01
  Administered 2024-07-27 – 2024-07-31 (×6): 30 mg via ORAL
  Filled 2024-07-27 (×2): qty 2
  Filled 2024-07-27 (×3): qty 1
  Filled 2024-07-27: qty 2
  Filled 2024-07-27: qty 1

## 2024-07-27 MED ORDER — ACETAMINOPHEN 325 MG PO TABS
650.0000 mg | ORAL_TABLET | Freq: Four times a day (QID) | ORAL | Status: DC | PRN
  Administered 2024-07-27: 650 mg via ORAL
  Filled 2024-07-27 (×6): qty 2

## 2024-07-27 MED ORDER — BUDESON-GLYCOPYRROL-FORMOTEROL 160-9-4.8 MCG/ACT IN AERO
2.0000 | INHALATION_SPRAY | Freq: Two times a day (BID) | RESPIRATORY_TRACT | Status: DC
  Administered 2024-07-27 – 2024-08-01 (×11): 2 via RESPIRATORY_TRACT
  Filled 2024-07-27 (×2): qty 5.9

## 2024-07-27 MED ORDER — PREGABALIN 100 MG PO CAPS
100.0000 mg | ORAL_CAPSULE | Freq: Every day | ORAL | Status: DC
  Administered 2024-07-27 – 2024-07-28 (×3): 100 mg via ORAL
  Filled 2024-07-27 (×5): qty 1

## 2024-07-27 MED ORDER — SODIUM CHLORIDE 0.9 % IV SOLN
2.0000 g | Freq: Two times a day (BID) | INTRAVENOUS | Status: DC
  Administered 2024-07-27 – 2024-08-01 (×11): 2 g via INTRAVENOUS
  Filled 2024-07-27 (×12): qty 12.5

## 2024-07-27 MED ORDER — BUPROPION HCL ER (XL) 150 MG PO TB24
150.0000 mg | ORAL_TABLET | Freq: Every day | ORAL | Status: DC
  Administered 2024-07-27 – 2024-08-01 (×6): 150 mg via ORAL
  Filled 2024-07-27 (×7): qty 1

## 2024-07-27 MED ORDER — SODIUM CHLORIDE 0.9% FLUSH: 3.0000 mL | INTRAVENOUS | Status: DC | PRN

## 2024-07-27 MED ORDER — AMLODIPINE BESYLATE 2.5 MG PO TABS
2.5000 mg | ORAL_TABLET | Freq: Every day | ORAL | Status: DC
  Administered 2024-07-27 – 2024-07-31 (×5): 2.5 mg via ORAL
  Filled 2024-07-27 (×7): qty 1

## 2024-07-27 MED ORDER — ONDANSETRON HCL 4 MG PO TABS: 4.0000 mg | ORAL_TABLET | Freq: Four times a day (QID) | ORAL | Status: DC | PRN

## 2024-07-27 MED ORDER — LEVOTHYROXINE SODIUM 100 MCG PO TABS
100.0000 ug | ORAL_TABLET | Freq: Every day | ORAL | Status: DC
  Administered 2024-07-27 – 2024-08-01 (×5): 100 ug via ORAL
  Filled 2024-07-27 (×7): qty 1

## 2024-07-27 MED ORDER — SODIUM CHLORIDE 0.9% FLUSH
3.0000 mL | Freq: Two times a day (BID) | INTRAVENOUS | Status: DC
  Administered 2024-07-27 (×3): 3 mL via INTRAVENOUS

## 2024-07-27 MED ORDER — ASPIRIN 81 MG PO TBEC
81.0000 mg | DELAYED_RELEASE_TABLET | Freq: Every day | ORAL | Status: DC
  Administered 2024-07-27 – 2024-08-01 (×6): 81 mg via ORAL
  Filled 2024-07-27 (×7): qty 1

## 2024-07-27 MED ORDER — VANCOMYCIN VARIABLE DOSE PER UNSTABLE RENAL FUNCTION (PHARMACIST DOSING): Status: DC

## 2024-07-27 MED ORDER — ONDANSETRON HCL 4 MG/2ML IJ SOLN: 4.0000 mg | Freq: Four times a day (QID) | INTRAMUSCULAR | Status: DC | PRN

## 2024-07-27 MED ORDER — ATORVASTATIN CALCIUM 40 MG PO TABS
40.0000 mg | ORAL_TABLET | Freq: Every day | ORAL | Status: DC
Start: 2024-07-27 — End: 2024-08-01
  Administered 2024-07-27 – 2024-07-31 (×6): 40 mg via ORAL
  Filled 2024-07-27 (×7): qty 1

## 2024-07-27 MED ORDER — DOCUSATE SODIUM 100 MG PO CAPS
100.0000 mg | ORAL_CAPSULE | Freq: Two times a day (BID) | ORAL | Status: DC
  Filled 2024-07-27 (×12): qty 1

## 2024-07-27 NOTE — Progress Notes (Signed)
 Pharmacy Antibiotic Note  Cheyenne Lucero is a 63 y.o. female admitted on 07/26/2024 with pneumonia.  Pharmacy has been consulted for cefepime , vancomycin  dosing.  Recently admitted 8/6-10 for CAP/COPD exacerbation - received 5d azithro 500 mg/d x5d, Rocephin  1g IV q24h x4d and discharged with a 3-day supply of Augmentin .  Received vancomycin  1000 mg IV x1, cefepime  2g IV x1 in the ED 8/19 CT chest w/ L lower lobe and lingular atelectasis - PNA or underlying mass not excluded  SCr 1.99 (BL appears ~1.3-1.4), WBC 20, LA 1.8, afebrile   Plan: Vancomycin  500 mg IV x1 to complete 1500 mg load Variable vancomycin  with AKI Cefepime  2g IV q24h  Monitor renal function, clinical status, cultures, levels as indicated  Height: 5' 4 (162.6 cm) Weight: 68.9 kg (152 lb) IBW/kg (Calculated) : 54.7  Temp (24hrs), Avg:98.1 F (36.7 C), Min:98 F (36.7 C), Max:98.2 F (36.8 C)  Recent Labs  Lab 07/26/24 1735 07/26/24 1816  WBC 20.6*  --   CREATININE 1.99*  --   LATICACIDVEN  --  1.8    Estimated Creatinine Clearance: 27.6 mL/min (A) (by C-G formula based on SCr of 1.99 mg/dL (H)).    Allergies  Allergen Reactions   Morphine And Codeine Anaphylaxis   Tramadol  Nausea And Vomiting    Antimicrobials this admission: Vancomycin  8/20 >> c Cefepime  8/20 >> c  Dose adjustments this admission: NA  Microbiology results: 8/19 BCx: sent 8/19 MRSA PCR: ordered   Thank you for allowing pharmacy to be a part of this patient's care.  Maurilio Fila, PharmD Clinical Pharmacist 07/27/2024  12:30 AM

## 2024-07-27 NOTE — H&P (Incomplete)
 History and Physical    [name] FMW:[id number] DOB: 28-Nov-1961 DOA: 07/26/2024  PCP: Burney Darice CROME, MD   Patient coming from: Home   Chief Complaint:  Chief Complaint  Patient presents with  . Chest Pain  . Shortness of Breath   ED TRIAGE note:  Pt POV reporting CP/SOB that began today and elevated white count per PCP, recently admitted 8/6 for sepsis and pneumonia, advised to come to ED for evaluation.       HPI:  Cheyenne Lucero is a 63 y.o. female with hx of COPD, history of previous PE-resolution on VQ scan 07/17/2024, acquired hypothyroidism, essential hypertension, HLD, CKD3a, depression, recent admission 8/6-8/10 treated for CAP / COPD exacerbation. Seen by PCP with persistent SOB, chest pain. and noted to have high WBC, low normal BP referred to ED. Concern for ongoing / relapsed CAP. CT Chest with question of LLL / lingular pna v atelectasis and small L pleural effusion, underlying mass not excluded. Treated with Vanc / Cefepime , 1 L IVF.    Patient has transferred to [facility] for management recurrent episodes of pneumonia.  At presentation to ED patient found hypertensive otherwise hemodynamically stable.  Maintaining O2 sat 91 to 93% room air.   Significant labs in the ED:  Lab Orders         Culture, blood (Routine X 2) w Reflex to ID Panel         Basic metabolic panel         CBC         Differential       Review of Systems:  ROS  Past Medical History:  Diagnosis Date  . COPD (chronic obstructive pulmonary disease) (HCC)   . Coronary artery disease 2004   heart event from a blood clot   . Essential hypertension   . Hypothyroidism (acquired)   . Pulmonary embolus (HCC)    in Nov 2003 (after long-distance travel)    Past Surgical History:  Procedure Laterality Date  . LEFT HEART CATH       reports that she has quit smoking. Her smoking use included cigarettes. She has never used smokeless tobacco. She reports that she  does not drink alcohol and does not use drugs.  Allergies  Allergen Reactions  . Morphine And Codeine Anaphylaxis  . Tramadol  Nausea And Vomiting    Family History  Problem Relation Age of Onset  . Lupus Mother     Prior to Admission medications   Medication Sig Start Date End Date Taking? Authorizing Provider  acetaminophen  (TYLENOL ) 325 MG tablet Take 650 mg by mouth every 6 (six) hours as needed for mild pain.    [provider]  amLODipine  (NORVASC ) 2.5 MG tablet Take 1 tablet (2.5 mg total) by mouth daily. 07/17/24 10/15/24  Von Bellis, MD  aspirin  EC 81 MG tablet Take 81 mg by mouth daily. Swallow whole.    [provider]  atorvastatin  (LIPITOR) 40 MG tablet Take 1 tablet (40 mg total) by mouth at bedtime. 07/17/24 07/17/25  Von Bellis, MD  buPROPion  (WELLBUTRIN  XL) 150 MG 24 hr tablet Take 150 mg by mouth daily.    [provider]  Cholecalciferol  (VITAMIN D3) 2000 units TABS Take 1 tablet by mouth daily.    [provider]  cloNIDine  (CATAPRES ) 0.2 MG tablet Take 1.5 tablets (0.3 mg total) by mouth at bedtime, THEN 1 tablet (0.2 mg total) at bedtime, THEN 0.5 tablets (0.1 mg total) at bedtime.  07/17/24   Von Bellis, MD  colchicine  0.6 MG tablet Take 1 tablet (0.6 mg total) by mouth daily. 07/18/24 10/16/24  Von Bellis, MD  cyanocobalamin  1000 MCG tablet Take 1 tablet (1,000 mcg total) by mouth daily. 07/18/24 01/14/25  Von Bellis, MD  ezetimibe  (ZETIA ) 10 MG tablet Take 10 mg by mouth daily. 04/25/24   [provider]  guaiFENesin -dextromethorphan (ROBITUSSIN DM) 100-10 MG/5ML syrup Take 10 mLs by mouth every 6 (six) hours as needed for cough. 07/17/24   Von Bellis, MD  levothyroxine  (SYNTHROID ) 100 MCG tablet Take 100 mcg by mouth daily before breakfast.    [provider]  losartan  (COZAAR ) 100 MG tablet Take 1 tablet by mouth daily. 03/13/21   [provider]  metoprolol  tartrate (LOPRESSOR ) 50 MG tablet Take  25 mg by mouth 2 (two) times daily. 04/25/24   [provider]  mirtazapine  (REMERON ) 30 MG tablet Take 30 mg by mouth at bedtime.    [provider]  pregabalin  (LYRICA ) 50 MG capsule Take 100 mg by mouth at bedtime. 04/25/24   [provider]     Physical Exam: Vitals:   07/26/24 2145 07/26/24 2215 07/26/24 2230 07/26/24 2300  BP:   (!) 150/115 135/82  Pulse: 89 89 90 88  Resp: 19 18 18    Temp:   98.2 F (36.8 C)   TempSrc:      SpO2: 91% (!) 89% 91% 93%  Weight:      Height:        Physical Exam   Labs on Admission: I have personally reviewed following labs and imaging studies  CBC: Recent Labs  Lab 07/26/24 1735 07/26/24 1840  WBC 20.6*  --   NEUTROABS  --  14.7*  HGB 14.8  --   HCT 42.3  --   MCV 95.1  --   PLT 488*  --    Basic Metabolic Panel: Recent Labs  Lab 07/26/24 1735  NA 133*  K 3.4*  CL 91*  CO2 23  GLUCOSE 107*  BUN 26*  CREATININE 1.99*  CALCIUM  9.7   GFR: Estimated Creatinine Clearance: 27.6 mL/min (A) (by C-G formula based on SCr of 1.99 mg/dL (H)). Liver Function Tests: No results for input(s): AST, ALT, ALKPHOS, BILITOT, PROT, ALBUMIN  in the last 168 hours. No results for input(s): LIPASE, AMYLASE in the last 168 hours. No results for input(s): AMMONIA in the last 168 hours. Coagulation Profile: No results for input(s): INR, PROTIME in the last 168 hours. Cardiac Enzymes: No results for input(s): CKTOTAL, CKMB, CKMBINDEX, TROPONINI, TROPONINIHS in the last 168 hours. BNP (last 3 results) No results for input(s): BNP in the last 8760 hours. HbA1C: No results for input(s): HGBA1C in the last 72 hours. CBG: No results for input(s): GLUCAP in the last 168 hours. Lipid Profile: No results for input(s): CHOL, HDL, LDLCALC, TRIG, CHOLHDL, LDLDIRECT in the last 72 hours. Thyroid  Function Tests: No results for input(s): TSH, T4TOTAL, FREET4, T3FREE,  THYROIDAB in the last 72 hours. Anemia Panel: No results for input(s): VITAMINB12, FOLATE, FERRITIN, TIBC, IRON, RETICCTPCT in the last 72 hours. Urine analysis:    Component Value Date/Time   COLORURINE AMBER (A) 07/15/2024 0745   APPEARANCEUR HAZY (A) 07/15/2024 0745   LABSPEC 1.026 07/15/2024 0745   PHURINE 5.0 07/15/2024 0745   GLUCOSEU NEGATIVE 07/15/2024 0745   HGBUR NEGATIVE 07/15/2024 0745   BILIRUBINUR NEGATIVE 07/15/2024 0745   KETONESUR 5 (A) 07/15/2024 0745   PROTEINUR 100 (A) 07/15/2024 0745  NITRITE NEGATIVE 07/15/2024 0745   LEUKOCYTESUR TRACE (A) 07/15/2024 0745    Radiological Exams on Admission: I have personally reviewed images CT Chest Wo Contrast Result Date: 07/26/2024 CLINICAL DATA:  Pneumonia. EXAM: CT CHEST WITHOUT CONTRAST TECHNIQUE: Multidetector CT imaging of the chest was performed following the standard protocol without IV contrast. RADIATION DOSE REDUCTION: This exam was performed according to the departmental dose-optimization program which includes automated exposure control, adjustment of the mA and/or kV according to patient size and/or use of iterative reconstruction technique. COMPARISON:  Chest CT dated 07/13/2024. FINDINGS: Evaluation of this exam is limited in the absence of intravenous contrast. Cardiovascular: There is no cardiomegaly. Small pericardial effusion. Mild atherosclerotic calcification of the thoracic aorta. No aneurysmal dilatation. The central pulmonary arteries are grossly unremarkable. Mediastinum/Nodes: No hernia or or mediastinal adenopathy. The esophagus is grossly unremarkable. No mediastinal fluid collection. Lungs/Pleura: Similar size small left pleural effusion. There is partial compressive atelectasis of the left lower lobe and lingula similar or slightly progressed since the prior CT. Pneumonia or underlying mass is not excluded. Continued follow-up to resolution recommended. There is background of centrilobular  emphysema. No pneumothorax. The central airways are patent. Upper Abdomen: No acute abnormality.  Fatty liver. Musculoskeletal: Degenerative changes of the spine and scoliosis. No acute osseous pathology. IMPRESSION: 1. Similar or slightly progressed left pleural effusion and left lower lobe and lingular atelectasis. Pneumonia or underlying mass is not excluded. Continued follow-up to resolution recommended. 2. Aortic Atherosclerosis (ICD10-I70.0) and Emphysema (ICD10-J43.9). Electronically Signed   By: Vanetta Chou M.D.   On: 07/26/2024 19:09   DG Chest 2 View Result Date: 07/26/2024 CLINICAL DATA:  Chest pain EXAM: CHEST - 2 VIEW COMPARISON:  Chest x-ray 07/15/2024 FINDINGS: There is a new moderate-sized left pleural effusion. Can not exclude underlying left basilar atelectasis/airspace disease. The heart is enlarged, unchanged. Right lung is clear. Aorta is ectatic. There is no pneumothorax or acute fracture. IMPRESSION: New moderate-sized left pleural effusion. Can not exclude underlying left basilar atelectasis/airspace disease. Electronically Signed   By: Greig Pique M.D.   On: 07/26/2024 17:45     EKG: My personal interpretation of EKG shows: ***    Assessment/Plan: Principal Problem:   CAP (community acquired pneumonia)    Assessment and Plan: No notes have been filed under this hospital service. Service: Hospitalist      DVT prophylaxis:  {Blank single:19197::Lovenox ,SQ Heparin ,IV heparin  gtts,Xarelto,Eliquis,Coumadin,SCDs,***} Code Status:  {Blank single:19197::Full Code,DNR with Intubation,DNR/DNI(Do NOT Intubate),Comfort Care,***} Diet:  Family Communication:  *** Family was present at bedside, at the time of interview.  Opportunity was given to ask question and all questions were answered satisfactorily.  Disposition Plan:  ***  Consults:  ***  Admission status:   {Blank single:19197::Observation,Inpatient}, {Blank  single:19197::Med-Surg,Telemetry bed,Step Down Unit}  Severity of Illness: {Observation/Inpatient:21159}    Micaela Speaker, MD [facility]  How to contact the [facility] Attending or Consulting provider 7A - 7P or covering provider during after hours 7P -7A, for this patient.  Check the care team in [facility] and look for a) attending/consulting [facility] provider listed and b) the [facility] team listed Log into [email or web address] and use [place]'s universal password to access. If you do not have the password, please contact the hospital operator. Locate the [facility] provider you are looking for under [facility] and page to a number that you can be directly reached. If you still have difficulty reaching the provider, please page the [facility] (Director on Call) for the Hospitalists listed on amion for  assistance.  07/27/2024, 12:01 AM

## 2024-07-27 NOTE — Plan of Care (Signed)
 Problem: Education: Goal: Knowledge of General Education information will improve Description: Including pain rating scale, medication(s)/side effects and non-pharmacologic comfort measures 07/27/2024 0422 by Billee Marybelle Ragena SHAUNNA, RN Outcome: Progressing 07/27/2024 0404 by Billee Marybelle Ragena SHAUNNA, RN Outcome: Progressing   Problem: Health Behavior/Discharge Planning: Goal: Ability to manage health-related needs will improve 07/27/2024 0422 by Billee Marybelle Ragena SHAUNNA, RN Outcome: Progressing 07/27/2024 0404 by Billee Marybelle Ragena SHAUNNA, RN Outcome: Progressing   Problem: Clinical Measurements: Goal: Ability to maintain clinical measurements within normal limits will improve 07/27/2024 0422 by Billee Marybelle Ragena SHAUNNA, RN Outcome: Progressing 07/27/2024 0404 by Billee Marybelle Ragena SHAUNNA, RN Outcome: Progressing Goal: Will remain free from infection 07/27/2024 0422 by Billee Marybelle Ragena SHAUNNA, RN Outcome: Progressing 07/27/2024 0404 by Billee Marybelle Ragena SHAUNNA, RN Outcome: Progressing Goal: Diagnostic test results will improve 07/27/2024 0422 by Billee Marybelle Ragena SHAUNNA, RN Outcome: Progressing 07/27/2024 0404 by Billee Marybelle Ragena SHAUNNA, RN Outcome: Progressing Goal: Respiratory complications will improve 07/27/2024 0422 by Billee Marybelle Ragena SHAUNNA, RN Outcome: Progressing 07/27/2024 0404 by Billee Marybelle Ragena SHAUNNA, RN Outcome: Progressing Goal: Cardiovascular complication will be avoided 07/27/2024 0422 by Billee Marybelle Ragena SHAUNNA, RN Outcome: Progressing 07/27/2024 0404 by Billee Marybelle Ragena SHAUNNA, RN Outcome: Progressing   Problem: Activity: Goal: Risk for activity intolerance will decrease 07/27/2024 0422 by Billee Marybelle Ragena SHAUNNA, RN Outcome: Progressing 07/27/2024 0404 by Billee Marybelle Ragena SHAUNNA, RN Outcome: Progressing   Problem: Nutrition: Goal: Adequate nutrition will be maintained 07/27/2024 0422 by Billee Marybelle Ragena SHAUNNA, RN Outcome:  Progressing 07/27/2024 0404 by Billee Marybelle Ragena SHAUNNA, RN Outcome: Progressing   Problem: Coping: Goal: Level of anxiety will decrease 07/27/2024 0422 by Billee Marybelle Ragena SHAUNNA, RN Outcome: Progressing 07/27/2024 0404 by Billee Marybelle Ragena SHAUNNA, RN Outcome: Progressing   Problem: Elimination: Goal: Will not experience complications related to bowel motility 07/27/2024 0422 by Billee Marybelle Ragena SHAUNNA, RN Outcome: Progressing 07/27/2024 0404 by Billee Marybelle Ragena SHAUNNA, RN Outcome: Progressing Goal: Will not experience complications related to urinary retention 07/27/2024 0422 by Billee Marybelle Ragena SHAUNNA, RN Outcome: Progressing 07/27/2024 0404 by Billee Marybelle Ragena SHAUNNA, RN Outcome: Progressing   Problem: Pain Managment: Goal: General experience of comfort will improve and/or be controlled 07/27/2024 0422 by Billee Marybelle Ragena SHAUNNA, RN Outcome: Progressing 07/27/2024 0404 by Billee Marybelle Ragena SHAUNNA, RN Outcome: Progressing   Problem: Safety: Goal: Ability to remain free from injury will improve 07/27/2024 0422 by Billee Marybelle Ragena SHAUNNA, RN Outcome: Progressing 07/27/2024 0404 by Billee Marybelle Ragena SHAUNNA, RN Outcome: Progressing   Problem: Skin Integrity: Goal: Risk for impaired skin integrity will decrease 07/27/2024 0422 by Billee Marybelle Ragena SHAUNNA, RN Outcome: Progressing 07/27/2024 0404 by Billee Marybelle Ragena SHAUNNA, RN Outcome: Progressing   Problem: Activity: Goal: Ability to tolerate increased activity will improve 07/27/2024 0422 by Billee Marybelle Ragena SHAUNNA, RN Outcome: Progressing 07/27/2024 0404 by Billee Marybelle Ragena SHAUNNA, RN Outcome: Progressing   Problem: Clinical Measurements: Goal: Ability to maintain a body temperature in the normal range will improve 07/27/2024 0422 by Billee Marybelle Ragena SHAUNNA, RN Outcome: Progressing 07/27/2024 0404 by Billee Marybelle Ragena SHAUNNA, RN Outcome: Progressing   Problem: Respiratory: Goal: Ability to maintain  adequate ventilation will improve 07/27/2024 0422 by Billee Marybelle Ragena SHAUNNA, RN Outcome: Progressing 07/27/2024 0404 by Billee Marybelle Ragena SHAUNNA, RN Outcome: Progressing Goal: Ability to maintain a clear airway will improve 07/27/2024 0422 by Billee Marybelle Ragena SHAUNNA, RN Outcome: Progressing 07/27/2024 0404 by Billee Marybelle Ragena SHAUNNA, RN Outcome:  Progressing

## 2024-07-27 NOTE — Progress Notes (Signed)
 [facility]                                                                               [employer], is a 63 y.o. female, DOB - 03-15-1961, FMW:[id number] Admit date - 07/26/2024    Outpatient Primary MD for the patient is Burney Darice CROME, MD  LOS - 1  days    Brief summary   Cheyenne Lucero is a 63 y.o. female with hx of COPD, history of previous PE-resolution on VQ scan 07/17/2024, acquired hypothyroidism, essential hypertension, HLD, CKD3a and depression recent admission 8/6-8/10 treated for CAP and COPD exacerbation. Seen by PCP with persistent SOB, chest pain. and noted to have high WBC, low normal BP referred to ED. Concern for ongoing / relapsed CAP. CT Chest with question of LLL / lingular pna v atelectasis and small L pleural effusion, underlying mass not excluded. Treated with Vanc / Cefepime , 1 L IVF.      [facility] has [facility] to [facility] for management recurrent episodes of pneumonia.   Assessment & Plan    Assessment and Plan:   Recurrent/ Persistent pneumonia:  Rachel oxygen to keep sats greater than 90%.  -CT chest showed Similar or slightly progressed left pleural effusion and left lower lobe and lingular atelectasis. Pneumonia or underlying mass is not excluded. Continued follow-up to resolution recommended.  continue with broad spectrum IV antibiotics and repeat CT in 2 weeks to evaluate for resolution.  Wean her off oxygen to keep sats greater than 90%.    COPD Continue with Breztri  and duonebs prn.     Elevated troponin.  From demand ischemia for infection.  No chest pain.  EKG does not show any ischemic changes.    Hyperlipidemia Resume lipitor.     Essential hypertension Well controlled.      Generalized anxiety disorder Continue Wellbutrin    Hypothyroidism -Continue thyroxine   Peripheral neuropathy -Continue Lyrica    History of pulmonary embolism 2003-not on anticoagulation anymore  -  Repeat VQ scan from 07/15/2024 no evidence of pulmonary embolism anymore.     Estimated body mass index is 26.09 kg/m as calculated from the following:   Height as of this encounter: 5' 4 (1.626 m).   Weight as of this encounter: 68.9 kg.  Code Status: full code.  DVT Prophylaxis:  heparin  injection 5,000 Units Start: 07/27/24 0115 SCDs Start: 07/27/24 0015 Place TED hose Start: 07/27/24 0015   Level of Care: Level of care: Telemetry Medical Family Communication: none at bedside.   Disposition Plan:     Remains inpatient appropriate:  pending clinical improvement.   Procedures:  None.   Consultants:   None.   Antimicrobials:   Anti-infectives (From admission, onward)    Start     Dose/Rate Route Frequency Ordered Stop   07/27/24 2000  ceFEPIme  (MAXIPIME ) 2 g in sodium chloride  0.9 % 100 mL IVPB  Status:  Discontinued        2 g 200 mL/hr over 30 Minutes Intravenous Every 24 hours 07/27/24 0044 07/27/24 1526   07/27/24 2000  vancomycin  (VANCOREADY) IVPB 750 mg/150 mL        750 mg  150 mL/hr over 60 Minutes Intravenous Every 24 hours 07/27/24 1529     07/27/24 1600  ceFEPIme  (MAXIPIME ) 2 g in sodium chloride  0.9 % 100 mL IVPB        2 g 200 mL/hr over 30 Minutes Intravenous 2 times daily 07/27/24 1526     07/27/24 0044  vancomycin  variable dose per unstable renal function (pharmacist dosing)  Status:  Discontinued         Does not apply See admin instructions 07/27/24 0044 07/27/24 1529   07/27/24 0030  vancomycin  (VANCOREADY) IVPB 500 mg/100 mL        500 mg 100 mL/hr over 60 Minutes Intravenous  Once 07/27/24 0030 07/27/24 0348   07/26/24 1945  vancomycin  (VANCOCIN ) IVPB 1000 mg/200 mL premix        1,000 mg 200 mL/hr over 60 Minutes Intravenous  Once 07/26/24 1943 07/26/24 2154   07/26/24 1945  ceFEPIme  (MAXIPIME ) 2 g in sodium chloride  0.9 % 100 mL IVPB        2 g 200 mL/hr over 30 Minutes Intravenous  Once 07/26/24 1943 07/26/24 2036         Medications  Scheduled Meds:  amLODipine   2.5 mg Oral Daily   aspirin  EC  81 mg Oral Daily   atorvastatin   40 mg Oral QHS   budesonide -glycopyrrolate -formoterol   2 puff Inhalation BID   buPROPion   150 mg Oral Daily   cloNIDine   0.2 mg Oral QHS   Followed by   NOREEN ON 07/30/2024] cloNIDine   0.1 mg Oral QHS   docusate sodium   100 mg Oral BID   ezetimibe   10 mg Oral Daily   heparin   5,000 Units Subcutaneous Q8H   levothyroxine   100 mcg Oral QAC breakfast   metoprolol  tartrate  25 mg Oral BID   mirtazapine   30 mg Oral QHS   pregabalin   100 mg Oral QHS   sodium chloride  flush  3 mL Intravenous Q12H   sodium chloride  flush  3 mL Intravenous Q12H   Continuous Infusions:  sodium chloride      ceFEPime  (MAXIPIME ) IV     vancomycin      PRN Meds:.sodium chloride , acetaminophen  **OR** acetaminophen , guaiFENesin , ibuprofen , ipratropium-albuterol , ondansetron  **OR** ondansetron  (ZOFRAN ) IV, mouth rinse, sodium chloride  flush    Subjective:   Cheyenne Lucero was seen and examined today.  Dry cough.   Objective:   Vitals:   07/27/24 0135 07/27/24 0136 07/27/24 0719 07/27/24 1501  BP: 128/74  130/70 (!) 145/68  Pulse:   79 70  Resp:  18 19 18   Temp: 97.8 F (36.6 C)  98.5 F (36.9 C) 97.9 F (36.6 C)  TempSrc: Oral     SpO2:  96% 92% 93%  Weight:      Height:        Intake/Output Summary (Last 24 hours) at 07/27/2024 1535 Last data filed at 07/27/2024 0438 Gross per 24 hour  Intake 1115.87 ml  Output 120 ml  Net 995.87 ml   Filed Weights   07/26/24 1711  Weight: 68.9 kg     Exam General: Alert and oriented x 3, NAD Cardiovascular: S1 S2 auscultated, no murmurs, RRR Respiratory: Clear to auscultation bilaterally, no wheezing, rales or rhonchi Gastrointestinal: Soft, nontender, nondistended, + bowel sounds Ext: no pedal edema bilaterally Neuro: AAOx3, Cr N's II- XII. Strength 5/5 upper and lower extremities bilaterally Skin: No rashes Psych: Normal affect and  demeanor, alert and oriented x3    Data Reviewed:  I have personally reviewed following  labs and imaging studies   CBC Lab Results  Component Value Date   WBC 14.2 (H) 07/27/2024   RBC 4.14 07/27/2024   HGB 13.8 07/27/2024   HCT 39.3 07/27/2024   MCV 94.9 07/27/2024   MCH 33.3 07/27/2024   PLT 407 (H) 07/27/2024   MCHC 35.1 07/27/2024   RDW 13.6 07/27/2024   LYMPHSABS 4.3 (H) 07/26/2024   MONOABS 1.8 (H) 07/26/2024   EOSABS 0.1 07/26/2024   BASOSABS 0.1 07/26/2024     Last metabolic panel Lab Results  Component Value Date   NA 139 07/27/2024   K 3.8 07/27/2024   CL 99 07/27/2024   CO2 25 07/27/2024   BUN 21 07/27/2024   CREATININE 1.68 (H) 07/27/2024   GLUCOSE 109 (H) 07/27/2024   GFRNONAA 34 (L) 07/27/2024   GFRAA >60 02/23/2018   CALCIUM  8.7 (L) 07/27/2024   PHOS 5.0 (H) 07/17/2024   PROT 6.6 07/27/2024   ALBUMIN  2.7 (L) 07/27/2024   BILITOT 1.1 07/27/2024   ALKPHOS 87 07/27/2024   AST 21 07/27/2024   ALT 24 07/27/2024   ANIONGAP 15 07/27/2024    CBG (last 3)  No results for input(s): GLUCAP in the last 72 hours.    Coagulation Profile: No results for input(s): INR, PROTIME in the last 168 hours.   Radiology Studies: CT Chest Wo Contrast Result Date: 07/26/2024 CLINICAL DATA:  Pneumonia. EXAM: CT CHEST WITHOUT CONTRAST TECHNIQUE: Multidetector CT imaging of the chest was performed following the standard protocol without IV contrast. RADIATION DOSE REDUCTION: This exam was performed according to the departmental dose-optimization program which includes automated exposure control, adjustment of the mA and/or kV according to patient size and/or use of iterative reconstruction technique. COMPARISON:  Chest CT dated 07/13/2024. FINDINGS: Evaluation of this exam is limited in the absence of intravenous contrast. Cardiovascular: There is no cardiomegaly. Small pericardial effusion. Mild atherosclerotic calcification of the thoracic aorta. No aneurysmal  dilatation. The central pulmonary arteries are grossly unremarkable. Mediastinum/Nodes: No hernia or or mediastinal adenopathy. The esophagus is grossly unremarkable. No mediastinal fluid collection. Lungs/Pleura: Similar size small left pleural effusion. There is partial compressive atelectasis of the left lower lobe and lingula similar or slightly progressed since the prior CT. Pneumonia or underlying mass is not excluded. Continued follow-up to resolution recommended. There is background of centrilobular emphysema. No pneumothorax. The central airways are patent. Upper Abdomen: No acute abnormality.  Fatty liver. Musculoskeletal: Degenerative changes of the spine and scoliosis. No acute osseous pathology. IMPRESSION: 1. Similar or slightly progressed left pleural effusion and left lower lobe and lingular atelectasis. Pneumonia or underlying mass is not excluded. Continued follow-up to resolution recommended. 2. Aortic Atherosclerosis (ICD10-I70.0) and Emphysema (ICD10-J43.9). Electronically Signed   By: Vanetta Chou M.D.   On: 07/26/2024 19:09   DG Chest 2 View Result Date: 07/26/2024 CLINICAL DATA:  Chest pain EXAM: CHEST - 2 VIEW COMPARISON:  Chest x-ray 07/15/2024 FINDINGS: There is a new moderate-sized left pleural effusion. Can not exclude underlying left basilar atelectasis/airspace disease. The heart is enlarged, unchanged. Right lung is clear. Aorta is ectatic. There is no pneumothorax or acute fracture. IMPRESSION: New moderate-sized left pleural effusion. Can not exclude underlying left basilar atelectasis/airspace disease. Electronically Signed   By: Greig Pique M.D.   On: 07/26/2024 17:45       Elgie Butter M.D. [facility] 07/27/2024, 3:35 PM  Available via Epic secure chat 7am-7pm After 7 pm, please refer to night coverage provider listed on amion.

## 2024-07-27 NOTE — H&P (Signed)
 History and Physical    Cheyenne Lucero FMW:[id number] DOB: 10-May-1961 DOA: 07/26/2024  PCP: Burney Darice CROME, MD   Patient coming from: Home   Chief Complaint:  Chief Complaint  Patient presents with   Chest Pain   Shortness of Breath   ED TRIAGE note:  Pt POV reporting CP/SOB that began today and elevated white count per PCP, recently admitted 8/6 for sepsis and pneumonia, advised to come to ED for evaluation.       HPI:  Cheyenne Lucero is a 63 y.o. female with hx of COPD, history of previous PE-resolution on VQ scan 07/17/2024, acquired hypothyroidism, essential hypertension, HLD, CKD3a and depression recent admission 8/6-8/10 treated for CAP and COPD exacerbation. Seen by PCP with persistent SOB, chest pain. and noted to have high WBC, low normal BP referred to ED. Concern for ongoing / relapsed CAP. CT Chest with question of LLL / lingular pna v atelectasis and small L pleural effusion, underlying mass not excluded. Treated with Vanc / Cefepime , 1 L IVF.    [facility] for management recurrent episodes of pneumonia.  At presentation to ED patient found hypertensive otherwise hemodynamically stable.  Maintaining O2 sat 91 to 93% room air.  Lab work, chronically elevated troponin and flat troponin 3932. CBC showing leukocytosis 10.6 which is probably in the setting of recent steroid use.  BMP showed low sodium 133 which is at baseline, low potassium 3.4, creatinine 1.99 which is around baseline and elevated anion gap 19.  Normal lactic acid 1.2.  Blood cultures are in process.   Chest x-ray showed pleural effusion on the left side.  Unable to exclude infection atelectasis. CT chest showed Similar or slightly progressed left pleural effusion and left lower lobe and lingular atelectasis. Pneumonia or underlying mass is not excluded. Continued follow-up to resolution recommended.    During my evaluation at the bedside  patient reported that since she she is doing fairly well.  Denies any fever, chill, cough, sputum production, chest pain, shortness of breath, palpitation, nausea, vomiting and diarrhea.  Patient does not have any other complaint.  Patient reported that she had to stop the smoking 6-year ago however at home she does not have any prescription of any inhaler.   Significant labs in the ED: Lab Orders         Culture, blood (Routine X 2) w Reflex to ID Panel         Expectorated Sputum Assessment w Gram Stain, Rflx to Resp Cult         MRSA Next Gen by PCR, Nasal         Basic metabolic panel         CBC         Differential         Legionella Pneumophila Serogp 1 Ur Ag         Strep pneumoniae urinary antigen         Procalcitonin         Comprehensive metabolic panel         CBC       Review of Systems:  Review of Systems  Constitutional:  Negative for chills, fever, malaise/fatigue and weight loss.  Respiratory:  Negative for cough, hemoptysis, sputum production, shortness of breath and wheezing.   Cardiovascular:  Negative for chest pain, palpitations and leg swelling.  Gastrointestinal:  Negative for abdominal pain, diarrhea, heartburn, nausea and vomiting.  Genitourinary:  Negative  for dysuria.  Musculoskeletal:  Negative for joint pain and myalgias.  Neurological:  Negative for dizziness and headaches.  Psychiatric/Behavioral:  The patient is not nervous/anxious.     Past Medical History:  Diagnosis Date   COPD (chronic obstructive pulmonary disease) (HCC)    Coronary artery disease 2004   heart event from a blood clot    Essential hypertension    Hypothyroidism (acquired)    Pulmonary embolus (HCC)    in Nov 2003 (after long-distance travel)    Past Surgical History:  Procedure Laterality Date   LEFT HEART CATH       reports that she has quit smoking. Her smoking use included cigarettes. She has never used smokeless tobacco. She reports that she does not drink  alcohol and does not use drugs.  Allergies  Allergen Reactions   Morphine And Codeine Anaphylaxis   Tramadol  Nausea And Vomiting    Family History  Problem Relation Age of Onset   Lupus Mother     Prior to Admission medications   Medication Sig Start Date End Date Taking? Authorizing Provider  acetaminophen  (TYLENOL ) 325 MG tablet Take 650 mg by mouth every 6 (six) hours as needed for mild pain.    [provider]  amLODipine  (NORVASC ) 2.5 MG tablet Take 1 tablet (2.5 mg total) by mouth daily. 07/17/24 10/15/24  Von Bellis, MD  aspirin  EC 81 MG tablet Take 81 mg by mouth daily. Swallow whole.    [provider]  atorvastatin  (LIPITOR) 40 MG tablet Take 1 tablet (40 mg total) by mouth at bedtime. 07/17/24 07/17/25  Von Bellis, MD  buPROPion  (WELLBUTRIN  XL) 150 MG 24 hr tablet Take 150 mg by mouth daily.    [provider]  Cholecalciferol  (VITAMIN D3) 2000 units TABS Take 1 tablet by mouth daily.    [provider]  cloNIDine  (CATAPRES ) 0.2 MG tablet Take 1.5 tablets (0.3 mg total) by mouth at bedtime, THEN 1 tablet (0.2 mg total) at bedtime, THEN 0.5 tablets (0.1 mg total) at bedtime. 07/17/24   Von Bellis, MD  colchicine  0.6 MG tablet Take 1 tablet (0.6 mg total) by mouth daily. 07/18/24 10/16/24  Von Bellis, MD  cyanocobalamin  1000 MCG tablet Take 1 tablet (1,000 mcg total) by mouth daily. 07/18/24 01/14/25  Von Bellis, MD  ezetimibe  (ZETIA ) 10 MG tablet Take 10 mg by mouth daily. 04/25/24   [provider]  guaiFENesin -dextromethorphan (ROBITUSSIN DM) 100-10 MG/5ML syrup Take 10 mLs by mouth every 6 (six) hours as needed for cough. 07/17/24   Von Bellis, MD  levothyroxine  (SYNTHROID ) 100 MCG tablet Take 100 mcg by mouth daily before breakfast.    [provider]  losartan  (COZAAR ) 100 MG tablet Take 1 tablet by mouth daily. 03/13/21   [provider]  metoprolol  tartrate (LOPRESSOR ) 50 MG tablet Take 25 mg by mouth 2  (two) times daily. 04/25/24   [provider]  mirtazapine  (REMERON ) 30 MG tablet Take 30 mg by mouth at bedtime.    [provider]  pregabalin  (LYRICA ) 50 MG capsule Take 100 mg by mouth at bedtime. 04/25/24   [provider]     Physical Exam: Vitals:   07/26/24 2145 07/26/24 2215 07/26/24 2230 07/26/24 2300  BP:   (!) 150/115 135/82  Pulse: 89 89 90 88  Resp: 19 18 18    Temp:   98.2 F (36.8 C)   TempSrc:      SpO2: 91% (!) 89% 91% 93%  Weight:  Height:        Physical Exam Vitals and nursing note reviewed.  Constitutional:      General: She is not in acute distress.    Appearance: She is not ill-appearing.  Cardiovascular:     Rate and Rhythm: Normal rate and regular rhythm.     Heart sounds: Normal heart sounds.  Pulmonary:     Effort: Pulmonary effort is normal.     Breath sounds: Normal breath sounds. No wheezing or rhonchi.  Musculoskeletal:     Right lower leg: No edema.     Left lower leg: No edema.  Skin:    General: Skin is warm.     Capillary Refill: Capillary refill takes less than 2 seconds.  Neurological:     Mental Status: She is alert and oriented to person, place, and time.  Psychiatric:        Mood and Affect: Mood normal. Mood is not anxious.      Labs on Admission: I have personally reviewed following labs and imaging studies  CBC: Recent Labs  Lab 07/26/24 1735 07/26/24 1840  WBC 20.6*  --   NEUTROABS  --  14.7*  HGB 14.8  --   HCT 42.3  --   MCV 95.1  --   PLT 488*  --    Basic Metabolic Panel: Recent Labs  Lab 07/26/24 1735  NA 133*  K 3.4*  CL 91*  CO2 23  GLUCOSE 107*  BUN 26*  CREATININE 1.99*  CALCIUM  9.7   GFR: Estimated Creatinine Clearance: 27.6 mL/min (A) (by C-G formula based on SCr of 1.99 mg/dL (H)). Liver Function Tests: No results for input(s): AST, ALT, ALKPHOS, BILITOT, PROT, ALBUMIN  in the last 168 hours. No results for input(s): LIPASE, AMYLASE in the last  168 hours. No results for input(s): AMMONIA in the last 168 hours. Coagulation Profile: No results for input(s): INR, PROTIME in the last 168 hours. Cardiac Enzymes: No results for input(s): CKTOTAL, CKMB, CKMBINDEX, TROPONINI, TROPONINIHS in the last 168 hours. BNP (last 3 results) No results for input(s): BNP in the last 8760 hours. HbA1C: No results for input(s): HGBA1C in the last 72 hours. CBG: No results for input(s): GLUCAP in the last 168 hours. Lipid Profile: No results for input(s): CHOL, HDL, LDLCALC, TRIG, CHOLHDL, LDLDIRECT in the last 72 hours. Thyroid  Function Tests: No results for input(s): TSH, T4TOTAL, FREET4, T3FREE, THYROIDAB in the last 72 hours. Anemia Panel: No results for input(s): VITAMINB12, FOLATE, FERRITIN, TIBC, IRON, RETICCTPCT in the last 72 hours. Urine analysis:    Component Value Date/Time   COLORURINE AMBER (A) 07/15/2024 0745   APPEARANCEUR HAZY (A) 07/15/2024 0745   LABSPEC 1.026 07/15/2024 0745   PHURINE 5.0 07/15/2024 0745   GLUCOSEU NEGATIVE 07/15/2024 0745   HGBUR NEGATIVE 07/15/2024 0745   BILIRUBINUR NEGATIVE 07/15/2024 0745   KETONESUR 5 (A) 07/15/2024 0745   PROTEINUR 100 (A) 07/15/2024 0745   NITRITE NEGATIVE 07/15/2024 0745   LEUKOCYTESUR TRACE (A) 07/15/2024 0745    Radiological Exams on Admission: I have personally reviewed images CT Chest Wo Contrast Result Date: 07/26/2024 CLINICAL DATA:  Pneumonia. EXAM: CT CHEST WITHOUT CONTRAST TECHNIQUE: Multidetector CT imaging of the chest was performed following the standard protocol without IV contrast. RADIATION DOSE REDUCTION: This exam was performed according to the departmental dose-optimization program which includes automated exposure control, adjustment of the mA and/or kV according to patient size and/or use of iterative reconstruction technique. COMPARISON:  Chest CT dated 07/13/2024. FINDINGS: Evaluation  of this exam is  limited in the absence of intravenous contrast. Cardiovascular: There is no cardiomegaly. Small pericardial effusion. Mild atherosclerotic calcification of the thoracic aorta. No aneurysmal dilatation. The central pulmonary arteries are grossly unremarkable. Mediastinum/Nodes: No hernia or or mediastinal adenopathy. The esophagus is grossly unremarkable. No mediastinal fluid collection. Lungs/Pleura: Similar size small left pleural effusion. There is partial compressive atelectasis of the left lower lobe and lingula similar or slightly progressed since the prior CT. Pneumonia or underlying mass is not excluded. Continued follow-up to resolution recommended. There is background of centrilobular emphysema. No pneumothorax. The central airways are patent. Upper Abdomen: No acute abnormality.  Fatty liver. Musculoskeletal: Degenerative changes of the spine and scoliosis. No acute osseous pathology. IMPRESSION: 1. Similar or slightly progressed left pleural effusion and left lower lobe and lingular atelectasis. Pneumonia or underlying mass is not excluded. Continued follow-up to resolution recommended. 2. Aortic Atherosclerosis (ICD10-I70.0) and Emphysema (ICD10-J43.9). Electronically Signed   By: Vanetta Chou M.D.   On: 07/26/2024 19:09   DG Chest 2 View Result Date: 07/26/2024 CLINICAL DATA:  Chest pain EXAM: CHEST - 2 VIEW COMPARISON:  Chest x-ray 07/15/2024 FINDINGS: There is a new moderate-sized left pleural effusion. Can not exclude underlying left basilar atelectasis/airspace disease. The heart is enlarged, unchanged. Right lung is clear. Aorta is ectatic. There is no pneumothorax or acute fracture. IMPRESSION: New moderate-sized left pleural effusion. Can not exclude underlying left basilar atelectasis/airspace disease. Electronically Signed   By: Greig Pique M.D.   On: 07/26/2024 17:45     EKG: My personal interpretation of EKG shows: Normal sinus rhythm heart rate  83    Assessment/Plan: Principal Problem:   CAP (community acquired pneumonia) Active Problems:   Hyperthyroidism   Essential hypertension   Elevated troponin   History of essential hypertension   History of COPD   History of pulmonary embolism-resolved   Hyperlipidemia    Assessment and Plan: Comminuted acquired pneumonia -Patient recently admitted 10 days ago for COPD conservation, acute hypoxic respiratory failure and sepsis in the setting of CAP.  Since discharge patient is doing well.  Denies any fever, cough, chest pain or shortness of breath.  Had PCP visit today which showed elevated WBC count and patient was referred to ED for further evaluation.  Further workup and which showed persistent pneumonia otherwise patient is hemodynamically stable.  Does not meet sepsis or SIRS criteria at this time.  Patient has been transferred to [facility]. -At presentation to ED patient found hypertensive otherwise hemodynamically stable.  Maintaining O2 sat 91 to 93% room air. -Lab work, chronically elevated troponin and flat troponin 3932. CBC showing leukocytosis 10.6 which is probably in the setting of recent steroid use.  BMP showed low sodium 133 which is at baseline, low potassium 3.4, creatinine 1.99 which is around baseline and elevated anion gap 19.  Normal lactic acid 1.2.  Blood cultures are in process. -Chest x-ray showed pleural effusion on the left side.  Unable to exclude infection atelectasis. -CT chest showed Similar or slightly progressed left pleural effusion and left lower lobe and lingular atelectasis. Pneumonia or underlying mass is not excluded. Continued follow-up to resolution recommended.  -Continue IV vancomycin  and cefepime  for MRSA and Pseudomonas coverage in the setting of new hospitalization for pneumonia. -As CT scan showing some concern for lung mass need to follow-up with repeat CT scan in outpatient setting after 2 weeks to follow-up for clear  visualization.. The continue supportive care. -Based on the culture  results can de-escalate antibiotic.  History of COPD -Stable.  Per chart review at home patient is not any long-acting LABA LAMA. - Starting Breztri  twice daily. - Continue DuoNeb every 6 hour as needed for wheezing shortness of breath. - Continue check pulse ox and as needed supplemental oxygen keep O2 sat of 92% - On discharge patient need prescription of inhalers.   Elevated troponin-secondary demand ischemia -Flat troponin.  EKG showed normal sinus rhythm heart rate 83 without any ST-T abnormality.  Patient does not have any chest pain.  At this time there is no cause for tachycardia syndrome.  Per chart review when patient was admitted 2 weeks ago troponin was around the same range.  Echocardiogram has done which showed preserved EF and diastolic dysfunction.  There is no wall motion abnormality. -Denies any chest pain and chest pressure.  At this time there is no concern for ACS.  Hyperlipidemia -Continue Lipitor  Essential hypertension -Continue clonidine  with tapered dose (currently taking 0.2 mg 4 days later followed by continued 0.1 mg for 1 week), Lopressor  twice daily and amlodipine  2.5 mg daily   Generalized anxiety disorder -Continue Wellbutrin   Hypothyroidism -Continue thyroxine  Peripheral neuropathy -Continue Lyrica   History of pulmonary embolism 2003-not on anticoagulation anymore  - Repeat VQ scan from 07/15/2024 no evidence of pulmonary embolism anymore.   DVT prophylaxis:  SQ Heparin  Code Status:  Full Code Diet: Heart healthy diet Family Communication:   Family was present at bedside, at the time of interview. Opportunity was given to ask question and all questions were answered satisfactorily.  Disposition Plan:   None indicated at this time Consults: Need to follow-up with culture result for antibiotic guidance. Admission status:   Inpatient, Telemetry bed  Severity of Illness: The  appropriate patient status for this patient is INPATIENT. Inpatient status is judged to be reasonable and necessary in order to provide the required intensity of service to ensure the patient's safety. The patient's presenting symptoms, physical exam findings, and initial radiographic and laboratory data in the context of their chronic comorbidities is felt to place them at high risk for further clinical deterioration. Furthermore, it is not anticipated that the patient will be medically stable for discharge from the hospital within 2 midnights of admission.   * I certify that at the point of admission it is my clinical judgment that the patient will require inpatient hospital care spanning beyond 2 midnights from the point of admission due to high intensity of service, high risk for further deterioration and high frequency of surveillance required.DEWAINE    Byford Schools, MD [facility]  How to contact the [facility] Attending or Consulting provider 7A - 7P or covering provider during after hours 7P -7A, for this patient.  Check the care team in [facility] and look for a) attending/consulting [facility] provider listed and b) the [facility] team listed Log into [email or web address] and use [place] universal password to access. If you do not have the password, please contact the hospital operator. Locate the [facility] provider you are looking for under [facility] and page to a number that you can be directly reached. If you still have difficulty reaching the provider, please page the [facility] (Director on Call) for the Hospitalists listed on amion for assistance.  07/27/2024, 2:09 AM

## 2024-07-27 NOTE — TOC Initial Note (Signed)
 Transition of Care [facility]) - Initial/Assessment Note    Patient Details  Name: Cheyenne Lucero MRN: [id number] Date of Birth: [birth year]  [facility]) CM/SW Contact:    Rosalva Jon Bloch, RN Phone Number: 07/27/2024, 1:52 PM  Clinical Narrative:                 Readmit with c/o CP/SOB/PNA. Hx of COPD,PE, acquired hypothyroidism, essential hypertension, HLD, CKD3a and depression.  From home with spouse. PTA independent with ADL's , no DME usage.   Pt without transportation needs or RX med concerns. PCP: confirmed : Darice Henle  ICM following and will assist as needs presents.  Expected Discharge Plan: Home/Self Care Barriers to Discharge: Continued Medical Work up   Patient Goals and CMS Choice            Expected Discharge Plan and Services   Discharge Planning Services: CM Consult   Living arrangements for the past 2 months: Apartment                                      Prior Living Arrangements/Services Living arrangements for the past 2 months: Apartment Lives with:: Spouse Patient language and need for interpreter reviewed:: Yes Do you feel safe going back to the place where you live?: Yes      Need for Family Participation in Patient Care: No (Comment) Care giver support system in place?: No (comment)   Criminal Activity/Legal Involvement Pertinent to Current Situation/Hospitalization: No - Comment as needed  Activities of Daily Living   ADL Screening (condition at time of admission) Independently performs ADLs?: Yes (appropriate for developmental age) Is the patient deaf or have difficulty hearing?: Yes Does the patient have difficulty seeing, even when wearing glasses/contacts?: Yes Does the patient have difficulty concentrating, remembering, or making decisions?: Yes  Permission Sought/Granted                  Emotional Assessment       Orientation: : Oriented to Self, Oriented to Place, Oriented to  Time, Oriented  to Situation Alcohol / Substance Use: Not Applicable Psych Involvement: No (comment)  Admission diagnosis:  Pleural effusion [J90] CAP (community acquired pneumonia) [J18.9] Leukocytosis, unspecified type [D72.829] Community acquired pneumonia of left lower lobe of lung [J18.9] Patient Active Problem List   Diagnosis Date Noted   History of COPD 07/27/2024   History of pulmonary embolism-resolved 07/27/2024   Hyperlipidemia 07/27/2024   Acute hypoxic respiratory failure (HCC) 07/14/2024   Severe sepsis (HCC) 07/14/2024   CAP (community acquired pneumonia) 07/14/2024   Elevated d-dimer 07/14/2024   Atypical chest pain 07/14/2024   Elevated troponin 07/14/2024   Elevated serum creatinine 07/14/2024   History of essential hypertension 07/14/2024   COPD exacerbation (HCC) 07/13/2024   Anxiety state 02/09/2018   Essential hypertension 02/09/2018   Acquired hypothyroidism 02/06/2018   Hyperthyroidism 02/06/2018   Lumbar radiculopathy 11/19/2017   Other idiopathic scoliosis, thoracolumbar region 11/19/2017   PCP:  Henle Darice CROME, MD Pharmacy:   [facility] [address] [place] - [address] [place] [address] Phone: [phone] Fax: [phone]  [facility] [address] [place] [address] Phone: [phone] Fax: [phone]  [facility] [id number] - [place], [place] - [address] [place] [address] Phone: [phone] Fax: [phone]     Social Drivers  of Health (SDOH) Social History: SDOH Screenings   Food Insecurity: No Food Insecurity (07/27/2024)  Housing: Low Risk  (07/27/2024)  Transportation Needs: No Transportation Needs (07/27/2024)  Utilities: Not At Risk (07/27/2024)  Financial Resource Strain: Low Risk  (06/15/2024)   Received from [facility]  Tobacco Use: Medium Risk (07/27/2024)   SDOH Interventions:     Readmission Risk  Interventions     No data to display

## 2024-07-27 NOTE — Plan of Care (Signed)

## 2024-07-28 DIAGNOSIS — J189 Pneumonia, unspecified organism: Secondary | ICD-10-CM | POA: Diagnosis not present

## 2024-07-28 DIAGNOSIS — R7989 Other specified abnormal findings of blood chemistry: Secondary | ICD-10-CM | POA: Diagnosis not present

## 2024-07-28 DIAGNOSIS — I10 Essential (primary) hypertension: Secondary | ICD-10-CM | POA: Diagnosis not present

## 2024-07-28 DIAGNOSIS — E785 Hyperlipidemia, unspecified: Secondary | ICD-10-CM

## 2024-07-28 LAB — RESPIRATORY PANEL BY PCR

## 2024-07-28 LAB — LEGIONELLA PNEUMOPHILA SEROGP 1 UR AG: L. pneumophila Serogp 1 Ur Ag: NEGATIVE

## 2024-07-28 LAB — CBC WITH DIFFERENTIAL/PLATELET
Abs Immature Granulocytes: 0.09 K/uL — ABNORMAL HIGH (ref 0.00–0.07)
Basophils Absolute: 0.1 K/uL (ref 0.0–0.1)
Basophils Relative: 1 %
Eosinophils Absolute: 0.2 K/uL (ref 0.0–0.5)
Eosinophils Relative: 2 %
HCT: 42.8 % (ref 36.0–46.0)
Hemoglobin: 14.4 g/dL (ref 12.0–15.0)
Immature Granulocytes: 1 %
Lymphocytes Relative: 24 %
Lymphs Abs: 3.4 K/uL (ref 0.7–4.0)
MCH: 32.7 pg (ref 26.0–34.0)
MCHC: 33.6 g/dL (ref 30.0–36.0)
MCV: 97.1 fL (ref 80.0–100.0)
Monocytes Absolute: 1 K/uL (ref 0.1–1.0)
Monocytes Relative: 7 %
Neutro Abs: 9.8 K/uL — ABNORMAL HIGH (ref 1.7–7.7)
Neutrophils Relative %: 65 %
Platelets: 499 K/uL — ABNORMAL HIGH (ref 150–400)
RBC: 4.41 MIL/uL (ref 3.87–5.11)
RDW: 14 % (ref 11.5–15.5)
WBC: 14.6 K/uL — ABNORMAL HIGH (ref 4.0–10.5)
nRBC: 0 % (ref 0.0–0.2)

## 2024-07-28 LAB — BASIC METABOLIC PANEL WITH GFR
Anion gap: 15 (ref 5–15)
BUN: 19 mg/dL (ref 8–23)
CO2: 25 mmol/L (ref 22–32)
Calcium: 9.2 mg/dL (ref 8.9–10.3)
Chloride: 101 mmol/L (ref 98–111)
Creatinine, Ser: 1.26 mg/dL — ABNORMAL HIGH (ref 0.44–1.00)
GFR, Estimated: 48 mL/min — ABNORMAL LOW (ref >60)
Glucose, Bld: 122 mg/dL — ABNORMAL HIGH (ref 70–99)
Potassium: 3.6 mmol/L (ref 3.5–5.1)
Sodium: 141 mmol/L (ref 135–145)

## 2024-07-28 LAB — HIV ANTIBODY (ROUTINE TESTING W REFLEX): HIV Screen 4th Generation wRfx: NONREACTIVE

## 2024-07-28 MED ORDER — PREDNISONE 20 MG PO TABS
40.0000 mg | ORAL_TABLET | Freq: Every day | ORAL | Status: AC
  Administered 2024-07-28 – 2024-07-31 (×4): 40 mg via ORAL
  Filled 2024-07-28 (×4): qty 2

## 2024-07-28 MED ORDER — PREGABALIN 50 MG PO CAPS: 100.0000 mg | ORAL_CAPSULE | Freq: Every day | ORAL | 0 refills | Status: DC

## 2024-07-28 NOTE — Progress Notes (Signed)
 Pt admitted to [facility] program. Pt transported via QRV1 from hospital to residence. Pt lives at residence with her wife and father. Pt was able to ambulate into 1st floor apartment without assistance. PT states when she ambulates she feels slightly SOB but denies dizziness or weakness.   Safety check of home performed. Pt's home is very clean and all walkways are clear, well lit and free of clutter. Pt mainly spends time in her living room and all current health equipment set up there. Pt will be sleeping in her bedroom which is approximately 10-12 feet down a hallway. Pt has bathroom connected to her bedroom which is also clean and well lit. Pt has a shower tub combo with no grab bars or shower chair. Pt states she feels comfortable bathing in shower and will make sure she has assistance from her wife. Pt discouraged from using shower if she feels even slightly weak or unsteady and given alternatives such as bath wipes or no-rinse cleanser if she needs them. Pt also has 3 cats who are friendly.   Med rec performed with virtual RN and all home meds sequestered in tamper proof bags.  2 person skin assessment performed with RN.  Pt shown medication location and bags and instructed not to take anything without supervision of RN or Paramedic and pt agrees.  Pt shown how to use tablet to call RN with any concerns. [facility] hub phone number provided as well as Pt welcome binder.

## 2024-07-28 NOTE — Progress Notes (Signed)
 Patient transferred to the [facility] program. Communicated with patient via video tablet. AAOx4. No complaints of pain or discomfort. Denies SOB, headache, and dizziness. Room air. Plan of care reviewed with patient. Patient was told that the virtual nurse is available 24/7. [facility] phone number given to patient. Tablet usage explained. Medication reconciliation and skin check completed with Delon, paramedic. Patient agreeable with plan of care.

## 2024-07-28 NOTE — Progress Notes (Signed)
 [facility]                                                                               [employer], is a 63 y.o. female, DOB - 17-Nov-1961, FMW:[id number] Admit date - 07/26/2024    Outpatient Primary MD for the patient is Burney Darice CROME, MD  LOS - 2  days    Brief summary   Cheyenne Lucero is a 64 y.o. female with hx of COPD, history of previous PE-resolution on VQ scan 07/17/2024, acquired hypothyroidism, essential hypertension, HLD, CKD3a and depression recent admission 8/6-8/10 treated for CAP and COPD exacerbation. Seen by PCP with persistent SOB, chest pain. and noted to have high WBC, low normal BP referred to ED. Concern for ongoing / relapsed CAP. CT Chest with question of LLL / lingular pna v atelectasis and small L pleural effusion, underlying mass not excluded. Treated with Vanc / Cefepime , 1 L IVF.      [facility] has [facility] for management recurrent episodes of pneumonia.   Assessment & Plan    Assessment and Plan:   Recurrent/ Persistent pneumonia:  -CT chest showed Similar or slightly progressed left pleural effusion and left lower lobe and lingular atelectasis. Pneumonia or underlying mass is not excluded. Continued follow-up to resolution recommended.  Would recommend atleast 72 hours of IV antibiotics and repeat CT in 2 weeks to evaluate for resolution. Recommend ambulatory referral to pulmonology for recurrent/ persistent pneumonia and evaluation with a repeat CT chest.   Weaned her off oxygen. Please follow up the sputum cultures.    COPD Continue with Breztri  and duonebs prn. She will need neb machine and duonebs on discharge.     Elevated troponin.  From demand ischemia for infection.  No chest pain.  EKG does not show any ischemic changes.    Hyperlipidemia Resume lipitor.     Essential hypertension Well controlled.      Generalized anxiety disorder Continue Wellbutrin     Hypothyroidism -Continue thyroxine   Peripheral neuropathy -Continue Lyrica    History of pulmonary embolism 2003-not on anticoagulation anymore  - Repeat VQ scan from 07/15/2024 no evidence of pulmonary embolism anymore.     Estimated body mass index is 26.09 kg/m as calculated from the following:   Height as of this encounter: 5' 4 (1.626 m).   Weight as of this encounter: 68.9 kg.  Code Status: full code.  DVT Prophylaxis:  heparin  injection 5,000 Units Start: 07/27/24 0115 SCDs Start: 07/27/24 0015 Place TED hose Start: 07/27/24 0015   Level of Care: Level of care: Telemetry Medical Family Communication: none at bedside.   Disposition Plan:     Remains inpatient appropriate:  pending clinical improvement.   Procedures:  None.   Consultants:   None.   Antimicrobials:   Anti-infectives (From admission, onward)    Start     Dose/Rate Route Frequency Ordered Stop   07/27/24 2000  ceFEPIme  (MAXIPIME ) 2 g in sodium chloride  0.9 % 100 mL IVPB  Status:  Discontinued        2 g 200 mL/hr over 30 Minutes Intravenous Every 24 hours 07/27/24 0044 07/27/24 1526  07/27/24 2000  vancomycin  (VANCOREADY) IVPB 750 mg/150 mL        750 mg 150 mL/hr over 60 Minutes Intravenous Every 24 hours 07/27/24 1529     07/27/24 1600  ceFEPIme  (MAXIPIME ) 2 g in sodium chloride  0.9 % 100 mL IVPB        2 g 200 mL/hr over 30 Minutes Intravenous 2 times daily 07/27/24 1526     07/27/24 0044  vancomycin  variable dose per unstable renal function (pharmacist dosing)  Status:  Discontinued         Does not apply See admin instructions 07/27/24 0044 07/27/24 1529   07/27/24 0030  vancomycin  (VANCOREADY) IVPB 500 mg/100 mL        500 mg 100 mL/hr over 60 Minutes Intravenous  Once 07/27/24 0030 07/27/24 0348   07/26/24 1945  vancomycin  (VANCOCIN ) IVPB 1000 mg/200 mL premix        1,000 mg 200 mL/hr over 60 Minutes Intravenous  Once 07/26/24 1943 07/26/24 2154   07/26/24 1945  ceFEPIme  (MAXIPIME ) 2  g in sodium chloride  0.9 % 100 mL IVPB        2 g 200 mL/hr over 30 Minutes Intravenous  Once 07/26/24 1943 07/26/24 2036        Medications  Scheduled Meds:  amLODipine   2.5 mg Oral Daily   aspirin  EC  81 mg Oral Daily   atorvastatin   40 mg Oral QHS   budesonide -glycopyrrolate -formoterol   2 puff Inhalation BID   buPROPion   150 mg Oral Daily   cloNIDine   0.2 mg Oral QHS   Followed by   NOREEN ON 07/30/2024] cloNIDine   0.1 mg Oral QHS   docusate sodium   100 mg Oral BID   ezetimibe   10 mg Oral Daily   heparin   5,000 Units Subcutaneous Q8H   levothyroxine   100 mcg Oral QAC breakfast   metoprolol  tartrate  25 mg Oral BID   mirtazapine   30 mg Oral QHS   pregabalin   100 mg Oral QHS   sodium chloride  flush  3 mL Intravenous Q12H   sodium chloride  flush  3 mL Intravenous Q12H   Continuous Infusions:  ceFEPime  (MAXIPIME ) IV 2 g (07/28/24 0745)   vancomycin  750 mg (07/27/24 2132)   PRN Meds:.acetaminophen  **OR** acetaminophen , guaiFENesin , ibuprofen , ipratropium-albuterol , ondansetron  **OR** ondansetron  (ZOFRAN ) IV, mouth rinse, sodium chloride  flush    Subjective:   Cheyenne Lucero was seen and examined today.  Some cough, but improved from yesterday.   Objective:   Vitals:   07/27/24 1955 07/27/24 2001 07/28/24 0407 07/28/24 0854  BP: (!) 159/95  113/77 135/72  Pulse: 88  76 83  Resp: 17  16 17   Temp: 98.3 F (36.8 C)  97.9 F (36.6 C) 98.1 F (36.7 C)  TempSrc:      SpO2: 95% 95% 94% 90%  Weight:      Height:        Intake/Output Summary (Last 24 hours) at 07/28/2024 1324 Last data filed at 07/28/2024 0945 Gross per 24 hour  Intake 340 ml  Output --  Net 340 ml   Filed Weights   07/26/24 1711  Weight: 68.9 kg     Exam General exam: Appears calm and comfortable  Respiratory system: Clear to auscultation. Respiratory effort normal. Cardiovascular system: S1 & S2 heard, RRR. No JVD,  Gastrointestinal system: Abdomen is nondistended, soft and nontender.   Central nervous system: Alert and oriented.  Extremities: Symmetric 5 x 5 power. Skin: No rashes, lesions or ulcers Psychiatry: Mood &  affect appropriate.     Data Reviewed:  I have personally reviewed following labs and imaging studies   CBC Lab Results  Component Value Date   WBC 14.6 (H) 07/28/2024   RBC 4.41 07/28/2024   HGB 14.4 07/28/2024   HCT 42.8 07/28/2024   MCV 97.1 07/28/2024   MCH 32.7 07/28/2024   PLT 499 (H) 07/28/2024   MCHC 33.6 07/28/2024   RDW 14.0 07/28/2024   LYMPHSABS 3.4 07/28/2024   MONOABS 1.0 07/28/2024   EOSABS 0.2 07/28/2024   BASOSABS 0.1 07/28/2024     Last metabolic panel Lab Results  Component Value Date   NA 141 07/28/2024   K 3.6 07/28/2024   CL 101 07/28/2024   CO2 25 07/28/2024   BUN 19 07/28/2024   CREATININE 1.26 (H) 07/28/2024   GLUCOSE 122 (H) 07/28/2024   GFRNONAA 48 (L) 07/28/2024   GFRAA >60 02/23/2018   CALCIUM  9.2 07/28/2024   PHOS 5.0 (H) 07/17/2024   PROT 6.6 07/27/2024   ALBUMIN  2.7 (L) 07/27/2024   BILITOT 1.1 07/27/2024   ALKPHOS 87 07/27/2024   AST 21 07/27/2024   ALT 24 07/27/2024   ANIONGAP 15 07/28/2024    CBG (last 3)  No results for input(s): GLUCAP in the last 72 hours.    Coagulation Profile: No results for input(s): INR, PROTIME in the last 168 hours.   Radiology Studies: CT Chest Wo Contrast Result Date: 07/26/2024 CLINICAL DATA:  Pneumonia. EXAM: CT CHEST WITHOUT CONTRAST TECHNIQUE: Multidetector CT imaging of the chest was performed following the standard protocol without IV contrast. RADIATION DOSE REDUCTION: This exam was performed according to the departmental dose-optimization program which includes automated exposure control, adjustment of the mA and/or kV according to patient size and/or use of iterative reconstruction technique. COMPARISON:  Chest CT dated 07/13/2024. FINDINGS: Evaluation of this exam is limited in the absence of intravenous contrast. Cardiovascular: There is no  cardiomegaly. Small pericardial effusion. Mild atherosclerotic calcification of the thoracic aorta. No aneurysmal dilatation. The central pulmonary arteries are grossly unremarkable. Mediastinum/Nodes: No hernia or or mediastinal adenopathy. The esophagus is grossly unremarkable. No mediastinal fluid collection. Lungs/Pleura: Similar size small left pleural effusion. There is partial compressive atelectasis of the left lower lobe and lingula similar or slightly progressed since the prior CT. Pneumonia or underlying mass is not excluded. Continued follow-up to resolution recommended. There is background of centrilobular emphysema. No pneumothorax. The central airways are patent. Upper Abdomen: No acute abnormality.  Fatty liver. Musculoskeletal: Degenerative changes of the spine and scoliosis. No acute osseous pathology. IMPRESSION: 1. Similar or slightly progressed left pleural effusion and left lower lobe and lingular atelectasis. Pneumonia or underlying mass is not excluded. Continued follow-up to resolution recommended. 2. Aortic Atherosclerosis (ICD10-I70.0) and Emphysema (ICD10-J43.9). Electronically Signed   By: Vanetta Chou M.D.   On: 07/26/2024 19:09   DG Chest 2 View Result Date: 07/26/2024 CLINICAL DATA:  Chest pain EXAM: CHEST - 2 VIEW COMPARISON:  Chest x-ray 07/15/2024 FINDINGS: There is a new moderate-sized left pleural effusion. Can not exclude underlying left basilar atelectasis/airspace disease. The heart is enlarged, unchanged. Right lung is clear. Aorta is ectatic. There is no pneumothorax or acute fracture. IMPRESSION: New moderate-sized left pleural effusion. Can not exclude underlying left basilar atelectasis/airspace disease. Electronically Signed   By: Greig Pique M.D.   On: 07/26/2024 17:45       Elgie Butter M.D. [facility] 07/28/2024, 1:24 PM  Available via Epic secure chat 7am-7pm After 7 pm, please  refer to night coverage provider listed on amion.

## 2024-07-28 NOTE — Plan of Care (Signed)
 Hospital at [facility] Plan of Care Note     Cheyenne Lucero   FMW:[id number]  DOB: 1960/12/27  DOA: 07/26/2024     2 Date of Service: 07/28/2024     Subjective:  Cheyenne Lucero is a 63 y.o. female with hx of COPD, history of previous PE-resolution on VQ scan 07/17/2024, acquired hypothyroidism, essential hypertension, HLD, CKD3a and depression recent admission 8/6-8/10 treated for CAP and COPD exacerbation. Seen by PCP with persistent SOB, chest pain. and noted to have high WBC, low normal BP referred to ED. Concern for ongoing / relapsed CAP. CT Chest with question of LLL / lingular pna v atelectasis and small L pleural effusion, underlying mass not excluded. Treated with Vanc / Cefepime , 1 L IVF.    [facility] for management recurrent episodes of pneumonia.  At present, pt reports clinical improvement in shortness or breath as well as cough though they still persist to a degree. Minmal wheezing. No abdominal pain or diarrhea t present. Pt no longer smokes.   After extensive discussion at the bedside, patient is agreeable to transfer to the hospital at home program.    Hospital Problems Assessment and Plan: Recurrent/ Persistent pneumonia:  -CT chest showed Similar or slightly progressed left pleural effusion and left lower lobe and lingular atelectasis. Pneumonia or underlying mass is not excluded. Continued follow-up to resolution recommended.  Noted recurrence after admission for similar issue 8/6-->8/10  -Would recommend atleast 72 hours of IV antibiotics and repeat CT in 2 weeks to evaluate for resolution. -Plan for outpatient pulmonology referral in setting of recurrent pneumonia, baseline COPD, and need for repeat CT chest    -Follow up blood and respiratory cultures  -Cont supplemental O2 as needed though downtrending from 1-2L [place] to room air at present  -Adding on oral prednisone     COPD Continue with Breztri  and duonebs  prn.  Minimal wheezing  Oral prednisone  in setting of recurrent pneumonia       Elevated troponin.  Noted 8/19 Trop 20s- appears chronic/baseline  No chest pain.  EKG does not show any ischemic changes.      Hyperlipidemia Resume lipitor.      Essential hypertension BP stable  Cont home regimen  Titrate as appropriate      Generalized anxiety disorder Continue Wellbutrin    Hypothyroidism -Continue thyroxine   Peripheral neuropathy -Continue Lyrica    History of pulmonary embolism 2003-not on anticoagulation anymore  - Repeat VQ scan from 07/15/2024 no evidence of pulmonary embolism anymore.   Objective Vital signs were reviewed and unremarkable.  Vitals:   07/27/24 2001 07/28/24 0407 07/28/24 0854 07/28/24 1548  BP:  113/77 135/72 (!) 146/87  Pulse:  76 83 78  Temp:  97.9 F (36.6 C) 98.1 F (36.7 C) 98.1 F (36.7 C)  Resp:  16 17 18   Height:      Weight:      SpO2: 95% 94% 90% 95%  TempSrc:    Oral  BMI (Calculated):        Exam Physical Exam Constitutional:      Appearance: She is normal weight.  HENT:     Head: Normocephalic and atraumatic.     Nose: Nose normal.     Mouth/Throat:     Mouth: Mucous membranes are moist.  Eyes:     Pupils: Pupils are equal, round, and reactive to light.  Cardiovascular:     Rate and Rhythm: Normal rate and regular rhythm.  Pulmonary:     Effort: Pulmonary effort is normal.  Abdominal:     General: Bowel sounds are normal.  Musculoskeletal:        General: Normal range of motion.  Skin:    General: Skin is warm.  Neurological:     General: No focal deficit present.  Psychiatric:        Mood and Affect: Mood normal.      Labs / Other Information There are no new results to review at this time.  CT Chest Wo Contrast CLINICAL DATA:  Pneumonia.  EXAM: CT CHEST WITHOUT CONTRAST  TECHNIQUE: Multidetector CT imaging of the chest was performed following the standard protocol without IV  contrast.  RADIATION DOSE REDUCTION: This exam was performed according to the departmental dose-optimization program which includes automated exposure control, adjustment of the mA and/or kV according to patient size and/or use of iterative reconstruction technique.  COMPARISON:  Chest CT dated 07/13/2024.  FINDINGS: Evaluation of this exam is limited in the absence of intravenous contrast.  Cardiovascular: There is no cardiomegaly. Small pericardial effusion. Mild atherosclerotic calcification of the thoracic aorta. No aneurysmal dilatation. The central pulmonary arteries are grossly unremarkable.  Mediastinum/Nodes: No hernia or or mediastinal adenopathy. The esophagus is grossly unremarkable. No mediastinal fluid collection.  Lungs/Pleura: Similar size small left pleural effusion. There is partial compressive atelectasis of the left lower lobe and lingula similar or slightly progressed since the prior CT. Pneumonia or underlying mass is not excluded. Continued follow-up to resolution recommended. There is background of centrilobular emphysema. No pneumothorax. The central airways are patent.  Upper Abdomen: No acute abnormality.  Fatty liver.  Musculoskeletal: Degenerative changes of the spine and scoliosis. No acute osseous pathology.  IMPRESSION: 1. Similar or slightly progressed left pleural effusion and left lower lobe and lingular atelectasis. Pneumonia or underlying mass is not excluded. Continued follow-up to resolution recommended.  2. Aortic Atherosclerosis (ICD10-I70.0) and Emphysema (ICD10-J43.9).  Electronically Signed   By: Vanetta Chou M.D.   On: 07/26/2024 19:09 DG Chest 2 View CLINICAL DATA:  Chest pain  EXAM: CHEST - 2 VIEW  COMPARISON:  Chest x-ray 07/15/2024  FINDINGS: There is a new moderate-sized left pleural effusion. Can not exclude underlying left basilar atelectasis/airspace disease. The heart is enlarged, unchanged. Right lung is  clear. Aorta is ectatic. There is no pneumothorax or acute fracture.  IMPRESSION: New moderate-sized left pleural effusion. Can not exclude underlying left basilar atelectasis/airspace disease.  Electronically Signed   By: Greig Pique M.D.   On: 07/26/2024 17:45  Lab Results  Component Value Date   WBC 14.6 (H) 07/28/2024   HGB 14.4 07/28/2024   HCT 42.8 07/28/2024   MCV 97.1 07/28/2024   PLT 499 (H) 07/28/2024   Last metabolic panel Lab Results  Component Value Date   GLUCOSE 122 (H) 07/28/2024   NA 141 07/28/2024   K 3.6 07/28/2024   CL 101 07/28/2024   CO2 25 07/28/2024   BUN 19 07/28/2024   CREATININE 1.26 (H) 07/28/2024   GFRNONAA 48 (L) 07/28/2024   CALCIUM  9.2 07/28/2024   PHOS 5.0 (H) 07/17/2024   PROT 6.6 07/27/2024   ALBUMIN  2.7 (L) 07/27/2024   BILITOT 1.1 07/27/2024   ALKPHOS 87 07/27/2024   AST 21 07/27/2024   ALT 24 07/27/2024   ANIONGAP 15 07/28/2024     Hospital at Home Admission Criteria Checklist:  Formal consent explained in detail and signed at the bedside:yes  Patient meets inpatient admission criteria (see below  for further details)  Is pt medicare FFS( required for initial launch with plan to expand)? [place] prepaid Medicaid  Lives within 25 mil/ 30 min from [facility] within [place](pt may stay with family member during admission who lives within 25 miles or 30 min from [facility] w/in [facility]) ? yes  Hemodynamically stable with relatively low risk of clinical deterioration-not requiring ICU? yes Age >55?yes Does not require frequent touch-points or complex interventions/medications (ie Titrated Infusions (IV insulin, heparin  drips, vasoactive drips, use of infused or injectable controlled substances, patients on insulin)?  Any Behavioral Health comorbidities likely to increase risk for in-home care (ie Acute delirium or experiencing a marked altered mental status and cause is not a treatable condition in the home)? no  Has the patient been  on BIPAP during course of ED evaluation or hospitalization?no IF YES, Has the patient been off of BIPAP for >24 hours(If NO-THEN PATIENT DOES MEET INCLUSION CRITERIA)?n/a  Needs oxygen at home (<6L)? no Active safety concerns (ie Unable to use bedside commode independently and lacks caregiver support for safety- needs SNF placement, unable to obtain IV access)? no Has skin check been performed?pending with paramedice  Has Physical Therapy screened the patient?n/a- fully ambulatory-performing all ADLs  Common admission diagnoses including: CAP, COPD Exacerbation, Acute on chronic heart failure, Cellulitis, UTI , dehydration, acute resp failure with hypoxia (requiring <5L)   Social Screening: Denies significant ETOH intake? no Does not smoke and understands may not smoke in the presence of oxygen? no  Patient states able to use iPad/phone for communication/has family who is able to use? yes  Patient has agreed to be compliant with medication and treatment regimen of the program?yes Any active drug use in patient or primary caregiver including daily dosing of methadone?  no Stable home environment ( access to appropriate heating in cold conditions and/or appropriate air conditioning in hot conditions and/or no running water/electricity)? yes  No aggressive pets at home? no Firearm present  with ability or willingness to store them unloaded in a locked case for duration of hospitalization? no  Ambulatory? Fully ambulatory (independently cane, walker  wheelchair bound, bed bound) Bed bugs present on home evaluation?no  Family support system in place? yes Patient feels safe at home does not endorse any violence? yes Any actively decompensated behavioral healthy issues including agitation/aggressive behavior? no   Patient requests food to be provided by hospital home program?no PT/OT eval completed and not requiring SNF, ALF, inpatient rehab? N/a    To be admitted to the Hospital at [facility] program, a  patient generally must meet the following: 1. Requirement for Inpatient Level of Care: The patient's condition must necessitate an inpatient level of care. This is typically indicated by one or more of the following, depending on their specific diagnosis: -Persistent tachycardia despite appropriate treatment (e.g., for Heart Failure, UTI). - Persistent tachypnea (rapid breathing) or dyspnea (shortness of breath) that hasn't improved sufficiently with observation care (e.g., for Heart Failure, Pneumonia, Viral Illness, COVID). - Hypoxemia (low oxygen levels), such as a new need for oxygen, an increased need from baseline, or specific oxygen saturation levels (e.g., SpO2 <90-94% depending on the condition) that persist despite observation (e.g., for Heart Failure, COPD, Pneumonia, Viral Illness, COVID). - Need for Intravenous (IV) hydration due to an inability to maintain oral hydration, which persists despite observation care (e.g., for Cellulitis, UTI, Viral Illness, COVID). - Specific to Heart Failure: Persistent pulmonary edema, indicated by a new oxygen need, lack of improvement with IV diuretics,  and ongoing tachypnea/dyspnea. - Specific to COPD: A decrease in known baseline resting oxygen saturation (SpO2) by 4% or more, or an increase in pre-existing supplemental oxygen requirements, which persists despite observation and requires continued close monitoring. - Specific to Pneumonia: A Pneumonia Severity Index (PSI) class IV (moderate risk). - Specific to Cellulitis: Failure of outpatient antibiotic therapy (indicated by progression or no improvement after a minimum of 48 hours on an adequate regimen) or a clinical presentation (like acuity or rapidity of progression) that requires the intensity of monitoring found in an inpatient setting. - Specific to UTI: Persistence or worsening of clinical findings like fever, pain, or dehydration despite observation care; presence of significant  uropathy; suspected infection of an indwelling prosthetic device, stent, implant, or graft; or pregnancy with suspected pyelonephritis. 2. Appropriateness for Hospital at Home Setting: The patient's overall clinical picture, including the severity of their illness, their care needs, and their medical history and comorbidities, must be suitable for management in the Hospital at Home environment. This essentially means that none of the exclusion criteria (listed below) are met.  Unified Exclusion Criteria for Hospital at Home Admission: A patient would not be eligible for Hospital at Home if any of the following are present: - Hemodynamic Instability: - Hypotension (low blood pressure) is present. - Respiratory Instability or Needs Beyond Program Capability: - There is a new need for invasive or noninvasive ventilatory assistance (like BiPAP or a ventilator). - Oxygenation is not sufficient, generally indicated if an FiO2 (fraction of inspired oxygen) of 45% (which is about 6 Liters/minute via nasal cannula) or more is required to keep oxygen saturation (SpO2) at 90% or greater. - Monitoring or Procedural Needs Beyond Program Capability: - There is a need for invasive monitoring, such as a pulmonary artery catheter or an arterial line. - There is a need for immediate-response telemetry monitoring (for dangerous arrhythmia detection and subsequent immediate intervention). - The required medication regimen is beyond the capabilities of Hospital at Home (e.g., dosing intervals are too frequent for home administration). - There is a need for a procedure that cannot be performed by the Hospital at [facility] team (e.g., significant wound debridement or abscess drainage for cellulitis, or percutaneous nephrostomy for a complicated UTI). - Significant Organ Dysfunction or Markers of Severe Illness: - Mental status is not at baseline, or there is altered mental status suggestive of inadequate  perfusion. - Renal (kidney) function is unstable or showing an ongoing decline. - There is evidence of inadequate perfusion, such as metabolic acidosis or myocardial ischemia. - Uncompensated acidosis is present. - Condition-Specific Severity or Complications Making Home Care Unsuitable: -For Heart Failure: Known severe cardiac valvular disease (e.g., aortic stenosis, mitral regurgitation); or severe peripheral edema that impairs the ability to urinate or ambulate. -For COPD: Known concurrent comorbidity or finding that indicates a higher-risk COPD exacerbation (e.g., pulmonary fibrosis, cavitation, pleural effusion, pneumothorax, rib fracture). -For Pneumonia: Pneumonia Severity Index (PSI) class V (indicating high risk for inpatient mortality); known concurrent comorbidity or finding that indicates higher-risk pneumonia (e.g., pulmonary fibrosis, cavitation, large or loculated pleural effusion); or a concomitant serious infectious process like endocarditis or empyema. -For Cellulitis: Orbital, periorbital, or necrotizing infection is suspected; or a concomitant serious infectious process like endocarditis, septic emboli, or septic joint space infection. -For UTI: Urinary tract obstruction (e.g., kidney stone, bladder outlet obstruction); or a concomitant serious infectious process like endocarditis or septic emboli. -For Viral Illness & COVID-19: A concomitant serious infectious process like endocarditis or empyema. General Comorbidities  or Status: -The patient is significantly immunosuppressed (this applies to Pneumonia, Cellulitis, UTI, Viral Illness, and COVID-19). -The patient meets inpatient admission criteria for a second diagnosis, or has care needs beyond the capabilities of Hospital at Home due to an active clinically significant comorbidity. (This is a general exclusion across all listed conditions)  Time spent: >60 minutes  [facility] 07/28/2024, 2:09 PM

## 2024-07-28 NOTE — Progress Notes (Signed)
 Cheyenne Lucero transferred to hospital at home per MD order. Skin clean, dry, and intact without evidence of skin break down. IV catheter left in place. Patient escorted via wheelchair by paramedic, and transferred home for continued care.  Cheyenne Lucero  07/28/2024 4:47 PM

## 2024-07-28 NOTE — Plan of Care (Signed)

## 2024-07-28 NOTE — Plan of Care (Signed)
  Problem: Education: Goal: Knowledge of General Education information will improve Description: Including pain rating scale, medication(s)/side effects and non-pharmacologic comfort measures Outcome: Progressing   Problem: Clinical Measurements: Goal: Ability to maintain clinical measurements within normal limits will improve Outcome: Progressing   Problem: Clinical Measurements: Goal: Will remain free from infection Outcome: Progressing   Problem: Clinical Measurements: Goal: Respiratory complications will improve Outcome: Progressing   Problem: Nutrition: Goal: Adequate nutrition will be maintained Outcome: Progressing   Problem: Elimination: Goal: Will not experience complications related to bowel motility Outcome: Progressing Goal: Will not experience complications related to urinary retention Outcome: Progressing   Problem: Safety: Goal: Ability to remain free from injury will improve Outcome: Progressing   Problem: Respiratory: Goal: Ability to maintain adequate ventilation will improve Outcome: Progressing Goal: Ability to maintain a clear airway will improve Outcome: Progressing

## 2024-07-28 NOTE — Progress Notes (Signed)
 Arrived into the PT's room to find PT walking around, no obvious distress. She walks with a normal gait and denied any SOB upon exertion.   Paramedic Brittony Billick & Johann explained the daily visits to the PT and answered any questions she had about the program. PT felt comfortable with information and was excited to be a part of the program.   PT was informed that she would be picked up to go home within 2-3 hours.   PT denied having any other questions.

## 2024-07-28 NOTE — Progress Notes (Addendum)
 8082- Contact call to patient. Review of SPO2 current health data and education on impact of moderate to considerable movement reviewed per Current health representative outreach. RN introduction, Patient identifiers completed, plan for overnight monitoring, and [facility] contact information reviewed. Patient stable denies respiratory distress.      2147-Video Chat- Patient identifiers reviewed  patient A&O. Patient sitting up on sofa, denies health issues or concerns. Patient Self-administered medication with assistance of RN and Paramedic at the home. Blood pressure reading also completed. 141/90 HR 91. Patient stills denies pain. Plan for next day medications reviewed. Patient stable and doing well. Patient encouraged to contact [facility] as needed. Patient will continue to be monitored.

## 2024-07-29 LAB — CULTURE, RESPIRATORY W GRAM STAIN: Gram Stain: NONE SEEN

## 2024-07-29 LAB — BASIC METABOLIC PANEL WITH GFR
Anion gap: 12 (ref 5–15)
BUN: 18 mg/dL (ref 8–23)
CO2: 23 mmol/L (ref 22–32)
Calcium: 9.4 mg/dL (ref 8.9–10.3)
Chloride: 107 mmol/L (ref 98–111)
Creatinine, Ser: 1.05 mg/dL — ABNORMAL HIGH (ref 0.44–1.00)
GFR, Estimated: 60 mL/min — ABNORMAL LOW (ref >60)
Glucose, Bld: 93 mg/dL (ref 70–99)
Potassium: 3.9 mmol/L (ref 3.5–5.1)
Sodium: 142 mmol/L (ref 135–145)

## 2024-07-29 LAB — CBC
HCT: 39.9 % (ref 36.0–46.0)
Hemoglobin: 13.6 g/dL (ref 12.0–15.0)
MCH: 32.9 pg (ref 26.0–34.0)
MCHC: 34.1 g/dL (ref 30.0–36.0)
MCV: 96.6 fL (ref 80.0–100.0)
Platelets: 481 K/uL — ABNORMAL HIGH (ref 150–400)
RBC: 4.13 MIL/uL (ref 3.87–5.11)
RDW: 13.9 % (ref 11.5–15.5)
WBC: 17 K/uL — ABNORMAL HIGH (ref 4.0–10.5)
nRBC: 0 % (ref 0.0–0.2)

## 2024-07-29 MED ORDER — COLCHICINE 0.6 MG PO TABS
0.6000 mg | ORAL_TABLET | Freq: Every day | ORAL | Status: DC
  Administered 2024-07-29 – 2024-08-01 (×4): 0.6 mg via ORAL
  Filled 2024-07-29 (×5): qty 1

## 2024-07-29 MED ORDER — PREGABALIN 50 MG PO CAPS
100.0000 mg | ORAL_CAPSULE | Freq: Every day | ORAL | Status: DC
  Administered 2024-07-29 – 2024-07-31 (×3): 100 mg via ORAL
  Filled 2024-07-29 (×5): qty 2

## 2024-07-29 NOTE — Progress Notes (Signed)
 Pt's oxygen saturation reading 87% at 1415 and has not had an updated reading on current health.RN called pt on video chat, pt was sitting up playing a video game. Suspect the motion from her playing the game is culprit. Pt is alert and oriented, in no distress.

## 2024-07-29 NOTE — Progress Notes (Addendum)
 0121---0126--Current health SPO2 alert for Hypoxia not alerting but showing below parameters (86-87%). Outreach calls to patient via phone. Patient Unable to reach, call automatically went to voicemail, message to call left. Contact call made to listed alternative contact (Significant other). Call went straight to voicemail, message left. Video call also made, patient unable to reach.  Will continue to monitor.  APP updated on SPO2 reading issues, Paramedic notified of update.    [id number]--Ji[place]jo video call made per APP response, no answer. APP notified, unable to reach and no significant changes to other data. Will continue to monitor and update APP with additional significant changes to data. Paramedic also updated with RN and APP plan.        0228--Current health data readings:    RR- 17   SPO2 92%    Pulse 70    0342--Alarm Hypoxia for SpO2-Patient called still unable to reach, provider aware of plan to continue to monitor other current health data and report significant changes.

## 2024-07-29 NOTE — Progress Notes (Addendum)
 Outreach to current Chartered loss adjuster via application chat to report no updated current health data populating across 30 mins.  0428--Per operation, there may have been a lapse in connectivity, data should reconnect soon. APP also made aware.   0445--Update per Current Health IT operator- Data is backfilling extremely slow on their end confirms patient may be sleeping on top of the wearable. Patient still unable to reach. Both IT operator and RN will continue to monitor.  0523--Data is now starting to slowly backfill into classic view of Current Health dashboard. Will continue to monitor.

## 2024-07-29 NOTE — Progress Notes (Signed)
 Outreach to Environmental education officer. No SPO2 reading  via dashboard.   Per Operator; due to significant movement; data is unable to capture, once patient is still data should backfill. Suggested to also reassess wearable placement once in the home.  1950--Data and SPO2 now capturing.

## 2024-07-29 NOTE — Progress Notes (Signed)
 Pt seen today for routine [facility] visit. Pt appears well and is up and moving around without any issues. Pt states she slept well.   Pt's arm band broke this morning. Per virtual RN, having issues since admission yesterday receiving readings from wearable. I disconnected main kit used for admission and reconnected a new main kit.   Vital signs and assessment obtained as noted.   Lung sounds are clear in upper fields but diminished Left>Right in lower fields.   [facility] portable pulse ox shows adequate O2 saturations as noted.   PT denies any pain.   Medications administered as noted. Pt refuses colace as she says she never has been on that and has not had any problems with constipation.  Pt had virtual visit with Dr. Fausto and plan of care discussed.   Pt encouraged to call back with any problems throughout the day.

## 2024-07-29 NOTE — Progress Notes (Signed)
 Pt seen for evening routine visit.  Vitals and assessment obtained.  Meds administered.  O2 concentrator set up with O2 humidifier in Pt's bedroom. 7 foot extension tubing added to Cheyenne Lucero and concentrator set on 2 LPM. Pt shown how to turn on and off and adjust flow rate.   Pt had virtual visit with RN and phone confirmed to receive calls from virtual RN phone number.  Pt encouraged to call with any problems.

## 2024-07-29 NOTE — Progress Notes (Addendum)
 Virtual assessment with Ms Delk. Witnessed and assisted medication administration with Lauraine Dow, paramedic. Pt without complaints. Encouraged to call with any concerns.

## 2024-07-29 NOTE — Progress Notes (Signed)
 Completed virtual rounds with MD,paramedic at patient bedside. POC reviewed and discussed ,patient voices understanding and agreement. Pt reminded to call RN for any needs, RN and MD available at all times. Pt voices understanding. Pt aware of next planned visit and next call from RN.

## 2024-07-29 NOTE — Progress Notes (Signed)
 Received call from patient, states her current health arm band has broken. Paramedic has new kit to set up on morning rounds.

## 2024-07-29 NOTE — Plan of Care (Signed)
  Problem: Education: Goal: Knowledge of General Education information will improve Description: Including pain rating scale, medication(s)/side effects and non-pharmacologic comfort measures Outcome: Progressing   Problem: Health Behavior/Discharge Planning: Goal: Ability to manage health-related needs will improve Outcome: Progressing   Problem: Clinical Measurements: Goal: Ability to maintain clinical measurements within normal limits will improve Outcome: Progressing Goal: Will remain free from infection Outcome: Progressing Goal: Diagnostic test results will improve Outcome: Progressing Goal: Respiratory complications will improve Outcome: Progressing Goal: Cardiovascular complication will be avoided Outcome: Progressing   Problem: Activity: Goal: Risk for activity intolerance will decrease Outcome: Progressing   Problem: Nutrition: Goal: Adequate nutrition will be maintained Outcome: Progressing   Problem: Activity: Goal: Ability to tolerate increased activity will improve Outcome: Progressing   Problem: Respiratory: Goal: Ability to maintain adequate ventilation will improve Outcome: Progressing Goal: Ability to maintain a clear airway will improve Outcome: Progressing

## 2024-07-29 NOTE — Progress Notes (Signed)
 RN able to reach patient via phone and video chat. Pt states she was so sleepy she didn't hear the calls last night. Pt states in the hospital she slept at semi-fowlers and at home she slept flat so perhaps her o2 sats were correct and the position change is why her sats were low. Will ask paramedic to assess at visit. Pt states she slept well. Pt informed of morning visit by paramedic followed by am video call with RN and MD. Pt had no needs at this time. Reminded her to call if any needs.

## 2024-07-29 NOTE — Progress Notes (Addendum)
 Progress Note   Patient: Cheyenne Lucero FMW:[id number] DOB: [birth year] DOA: 07/26/2024     3 DOS: the patient was seen and examined on 07/29/2024   Patient identified themself as Cheyenne Lucero  DOB 1961/05/10  Medic Lauraine Faes present at bedside during encounter and performed the physical exam. Patient was seen today via video chat; my physical location [facility] [place]   Brief hospital course:  [name] is a 63 y.o. female with hx of COPD, history of previous PE-resolution on VQ scan 07/17/2024, acquired hypothyroidism, essential hypertension, HLD, CKD3a and depression recent admission 8/6-8/10 treated for CAP and COPD exacerbation. Seen by PCP with persistent SOB, chest pain. and noted to have high WBC, low normal BP referred to ED. Concern for ongoing / relapsed CAP. CT Chest with question of LLL / lingular pna v atelectasis and small L pleural effusion, underlying mass not excluded. Treated with Vanc / Cefepime , 1 L IVF.    Patient has transferred [facility] to [facility] for management recurrent episodes of pneumonia.   At present, pt reports clinical improvement in shortness or breath as well as cough though they still persist to a degree. Minmal wheezing. No abdominal pain or diarrhea t present. Pt no longer smokes.     8/21 - After extensive discussion at the bedside, patient is agreeable to transfer to the hospital at home program.  8/22 - sputum culture growing pseudomonas sensitive to cefepime  and rare GPC's without identified organism.  Pt clinically feels improved.  Overnight O2 desats to 87% noted.   Assessment and Plan:  Recurrent/Persistent pneumonia Left Pleural Effusion CT chest showed similar or slightly progressed left pleural effusion and left lower lobe and lingular atelectasis.  Pneumonia or underlying mass is not excluded. Continued follow-up to resolution recommended Noted recurrence of symptoms after admission for similar issue  8/6-->8/10.  Discharge summary of 8/10 reviewed.  8/22  -- continue IV Vancomycin  & Cefepime  --will continue IV antibiotics for another 48 hours given complicating factors and prolonged course --Repeat CT chest as outpatient in 2 weeks to evaluate for resolution. --Plan for outpatient pulmonology referral in setting of recurrent pneumonia, pleural effusion, baseline COPD, and need for repeat CT chest    --Follow blood and sputum cultures  Sputum culture with +Pseudomonas and rare GPC's not yet identified --Cont supplemental O2 as needed though downtrending from 1-2L [place] to room air at present  --Continue prednisone   --Duonebs PRN --Mucinex  BID PRN  Parapneumonic Pericarditis (presumed) -- diagnosed during initial admission for PNA. Seen by Cardiology, notes reviewed. Pt denies ongoing chest pain, has a 'twinge' here and there nothing like she'd been having at the time. --Continue colchicine   Cardiology recommended long 6 week to 3 month course --Cardiology to arrange outpatient follow up     COPD - overall stable with minimal wheezing on admission, now improved --Continue with Breztri  and duonebs prn.  --Continue prednisone  in setting of recurrent pneumonia       Elevated troponin.  Noted 8/19 Trop 20s- appears chronic/baseline  No chest pain.  EKG with no acute ischemic changes.      Hyperlipidemia --Continue lipitor   Essential hypertension - overall BP's stable --Cont home regimen  --Titrate regimen   Generalized anxiety disorder --Continue Wellbutrin    Hypothyroidism -Continue levothyroxine    Peripheral neuropathy -Continue Lyrica    History of pulmonary embolism 2003-not on anticoagulation anymore  - Repeat VQ scan from 07/15/2024 no evidence of pulmonary embolism.  Appears resolved.  Subjective: Pt seen virtually during AM Medic visit today.  She reports feeling much better today.  Slept well.  Reports little cough, minimal.  No ongoing chest pain, only a  twinge occasionally, nothing as severe as she'd had last admission.  Confirms still taking colchicine .  No fever or chills.     Physical Exam: Vitals:   07/29/24 1106 07/29/24 1126 07/29/24 1200 07/29/24 1234  BP: (!) 178/96   132/82  Pulse: 77  (!) 54   Resp: 16  19 20   Temp: 97.6 F (36.4 C)     TempSrc: Oral     SpO2:   97% 95%  Weight:  69.1 kg    Height:       General exam: awake, alert, no acute distress HEENT: atraumatic, clear conjunctiva, anicteric sclera, moist mucus membranes, hearing grossly normal  Respiratory system: lungs clear upper b/l, diminished bases left more than right, no expiratory wheezes, rales or rhonchi, normal respiratory effort at rest on room air Cardiovascular system: normal S1/S2, RRR, no edema.   Gastrointestinal system: soft, NT, ND Central nervous system: A&O x3. no gross focal neurologic deficits, normal speech Skin: dry, intact, normal temperature & no rashes on visualized skin Psychiatry: normal mood, congruent affect, judgement and insight appear normal   Data Reviewed:  Notable labs --  Cr improved 1.26 >> 1.05. Otherwise BMP normal CBC with increased WBC 14.6 >> 17.0 (on prednisone ) Platelets elevated 481 likely reactive due to infection     Disposition: Status is: Inpatient Remains inpatient appropriate because: warrants further IV antibiotics given recurrent PNA and complicating features outlined above.   Planned Discharge Destination: Home    Time spent: 45 minutes  Author: Burnard DELENA Cunning, DO 07/29/2024 4:26 PM  For on call review [email or web address].

## 2024-07-29 NOTE — Progress Notes (Addendum)
 Arrived at Pt's house to administer Vancomycin . Vital signs obtained as noted. Pt had additional questions about respiratory panel results. Virtual visit coordinated by virtual RN with Dr. Fausto to address same.   Also discussed O2 tonight at bedtime. Pt slightly averse to the idea due to O2 drying her nose out. I told pt I will bring O2 humidifier this evening to help combat that. Pt agreeable to same.  Pt made aware I will return for other abx and med administration later as well.  Plan on signing IMM letter tomorrow

## 2024-07-30 LAB — CBC
HCT: 38.1 % (ref 36.0–46.0)
Hemoglobin: 12.9 g/dL (ref 12.0–15.0)
MCH: 32.9 pg (ref 26.0–34.0)
MCHC: 33.9 g/dL (ref 30.0–36.0)
MCV: 97.2 fL (ref 80.0–100.0)
Platelets: 463 K/uL — ABNORMAL HIGH (ref 150–400)
RBC: 3.92 MIL/uL (ref 3.87–5.11)
RDW: 14 % (ref 11.5–15.5)
WBC: 15.8 K/uL — ABNORMAL HIGH (ref 4.0–10.5)
nRBC: 0 % (ref 0.0–0.2)

## 2024-07-30 NOTE — Plan of Care (Signed)
  Problem: Education: Goal: Knowledge of General Education information will improve Description: Including pain rating scale, medication(s)/side effects and non-pharmacologic comfort measures Outcome: Progressing   Problem: Health Behavior/Discharge Planning: Goal: Ability to manage health-related needs will improve Outcome: Progressing   Problem: Clinical Measurements: Goal: Diagnostic test results will improve Outcome: Progressing Goal: Respiratory complications will improve Outcome: Progressing   Problem: Activity: Goal: Risk for activity intolerance will decrease Outcome: Progressing   Problem: Nutrition: Goal: Adequate nutrition will be maintained Outcome: Progressing   Problem: Coping: Goal: Level of anxiety will decrease Outcome: Progressing   Problem: Elimination: Goal: Will not experience complications related to bowel motility Outcome: Progressing Goal: Will not experience complications related to urinary retention Outcome: Progressing   Problem: Pain Managment: Goal: General experience of comfort will improve and/or be controlled Outcome: Progressing   Problem: Safety: Goal: Ability to remain free from injury will improve Outcome: Progressing   Problem: Skin Integrity: Goal: Risk for impaired skin integrity will decrease Outcome: Progressing   Problem: Activity: Goal: Ability to tolerate increased activity will improve Outcome: Progressing   Problem: Clinical Measurements: Goal: Ability to maintain a body temperature in the normal range will improve Outcome: Progressing   Problem: Respiratory: Goal: Ability to maintain adequate ventilation will improve Outcome: Progressing Goal: Ability to maintain a clear airway will improve Outcome: Progressing

## 2024-07-30 NOTE — Progress Notes (Signed)
 Completed virtual rounds with MD,paramedic at patient bedside. POC reviewed and discussed ,patient voices understanding and agreement. Pt reminded to call RN for any needs, RN and MD available at all times. Pt voices understanding. Pt aware of next planned visit and next call from RN.

## 2024-07-30 NOTE — Progress Notes (Signed)
 Pt seen for routine morning HatH visit. Pt appears well and states she slept extremely well last night. Pt did not use O2 due to it feeling like there was no air coming out and the machine being too loud. RN was made aware of this prior to my visit.   Vital signs and assessment obtained as noted. Lungs sounded much clearer on my exam. Pt stlll reports feeling somewhat SOB with exertion or with excessive talking but states it has lessened some since the last few days. Pt continues to have regular Bms daily.  Medications administered. Pt refused Colace due to feeling she does not need it and has never been on it before.  Pt had virtual visit with MD and RN and plan of care discussed.  Labs drawn for CBC.  IMM letter signed today 07/30/24 at 11:30 am. Copy left on Pt's kitchen table and other copy placed in Pt's shadow chart, both with patient labels on them.   PT made aware I will be most likely coming back around 6pm for evening visit and encouraged to call with any problems.

## 2024-07-30 NOTE — Plan of Care (Signed)

## 2024-07-30 NOTE — TOC Progression Note (Signed)
 Transition of Care [facility]) - Progression Note    Patient Details  Name: Cheyenne Lucero MRN: [id number] Date of Birth: 16-Mar-1961  Transition of Care [facility]) CM/SW Contact  Corean JAYSON Canary, RN Phone Number: 07/30/2024, 8:29 AM  Clinical Narrative:    Patient transitioned to H@H  program. Her oxygen level dipped at night and it was confirmed that she did not use oxygen last night. Stayed in the  range of 88-90's% saturations.  TOC will monitor patient for any oxygen needs . Patient does not normally wear oxygen.  Plan is for discharge tomorrow afternoon. IP Care Management will follow for needs, recommendations, and transitions of care  1630 Discussed in ID rounds, Patient will be referred for OP OSA testing upon DC  Expected Discharge Plan: Home/Self Care Barriers to Discharge: Continued Medical Work up               Expected Discharge Plan and Services   Discharge Planning Services: CM Consult   Living arrangements for the past 2 months: Apartment                                       Social Drivers of Health (SDOH) Interventions SDOH Screenings   Food Insecurity: No Food Insecurity (07/27/2024)  Housing: Low Risk  (07/27/2024)  Transportation Needs: No Transportation Needs (07/27/2024)  Utilities: Not At Risk (07/27/2024)  Financial Resource Strain: Low Risk  (06/15/2024)   Received from [facility]  Tobacco Use: Medium Risk (07/27/2024)    Readmission Risk Interventions     No data to display

## 2024-07-30 NOTE — Progress Notes (Signed)
 I was notified by virtual RN that pt does not have any of her Lyrica . I went to PT's home and took photos of her personal home supply of Lyrica  and uploaded to media tab for pharmacy to verify. After a few minutes, virtual RN notified me that pharmacy verified pt's home med and was okay to take. Med administration witnessed by virtual RN and documented by her as well. Per pharmacy, they can dispense more in the morning for the medic to pick up tomorrow.   BP obtained as well and current health armband readjusted and moved to other arm.   PT encouraged to call back with any problems.

## 2024-07-30 NOTE — Progress Notes (Signed)
 Oxygen saturations range 86-91% overnight without decompensation of other vital signs.

## 2024-07-30 NOTE — Progress Notes (Signed)
 Phone call completed with patient to introduce myself as the RN for the night. Patient denies any needs at this time. Plan made for evening medication administration and patient encouraged to call via phone or tablet if any needs arise.

## 2024-07-30 NOTE — Progress Notes (Signed)
 Pt seen for Hath routine evening visit.  Pt states she is feeling well and has not had any issues today.   Vital signs and assessment obtained as noted.  Medications administered as noted.   Pt encouraged to call back with any further problems tonight.

## 2024-07-30 NOTE — Progress Notes (Addendum)
 Progress Note   Patient: Cheyenne Lucero FMW:[id number] DOB: 07-31-1961 DOA: 07/26/2024     4 DOS: the patient was seen and examined on 07/30/2024   Patient identified themself as Cheyenne Lucero  DOB February 18, 1961  Medic Lauraine Faes present at bedside during encounter and performed the physical exam. Patient was seen today via video chat; my physical location [facility] [place]   Brief hospital course:  Cheyenne Lucero is a 63 y.o. female with hx of COPD, history of previous PE-resolution on VQ scan 07/17/2024, acquired hypothyroidism, essential hypertension, HLD, CKD3a and depression recent admission 8/6-8/10 treated for CAP and COPD exacerbation. Seen by PCP with persistent SOB, chest pain. and noted to have high WBC, low normal BP referred to ED. Concern for ongoing / relapsed CAP. CT Chest with question of LLL / lingular pna v atelectasis and small L pleural effusion, underlying mass not excluded. Treated with Vanc / Cefepime , 1 L IVF.    Patient has transferred [facility] to [facility] for management recurrent episodes of pneumonia.   At present, pt reports clinical improvement in shortness or breath as well as cough though they still persist to a degree. Minmal wheezing. No abdominal pain or diarrhea t present. Pt no longer smokes.     8/21 - After extensive discussion at the bedside, patient is agreeable to transfer to the hospital at home program.  8/22 - sputum culture growing pseudomonas sensitive to cefepime  and rare GPC's without identified organism.  Pt clinically feels improved.  Overnight O2 desats to 87% noted.  8/23 - pt didn't tolerate O2 overnight.  Pt feeling better, mild dyspnea on exertion, overall feeling much improved since admission this time.   Assessment and Plan:  Recurrent/Persistent pneumonia Left Pleural Effusion CT chest showed similar or slightly progressed left pleural effusion and left lower lobe and lingular atelectasis.  Pneumonia  or underlying mass is not excluded. Continued follow-up to resolution recommended Noted recurrence of symptoms after admission for similar issue 8/6-->8/10.  Discharge summary of 8/10 reviewed.  8/23 -- continue IV Cefepime  --d/c Vancomycin  --will continue IV antibiotics for another 24 hours --Repeat CT chest as outpatient in 2 weeks to evaluate for resolution. --Plan for outpatient pulmonology referral in setting of recurrent pneumonia, pleural effusion, baseline COPD, and need for repeat CT chest    --Follow blood and sputum cultures  Sputum culture with +Pseudomonas and rare GPC's not yet identified Blood cx negative to date --Supplment O2 if spO2 < 90% on room air --Continue prednisone   --Duonebs PRN --Mucinex  BID PRN  Parapneumonic Pericarditis (presumed) -- diagnosed during initial admission for PNA. Seen by Cardiology, notes reviewed. Pt denies ongoing chest pain, has a 'twinge' here and there nothing like she'd been having at the time. --Continue colchicine   Cardiology recommended long 6 week to 3 month course --Cardiology to arrange outpatient follow up   Nocturnal hypoxia - noted first overnight at home with sat 87% on room air.  O2 concentrator brought to the home, pt attempted to use but found the noise bothersome, and nasal cannula irritating. --Recommend outpatient sleep study for ?sleep apnea --If pt remains admitted tomorrow night, will attempt a nocturnal pulse ox study --Will have pulmonology follow up     COPD - overall stable with minimal wheezing on admission, now improved --Continue with Breztri  and duonebs prn.  --Continue prednisone  in setting of recurrent pneumonia       Elevated troponin.  Noted 8/19 Trop 20s- appears chronic/baseline  No chest pain.  EKG with no acute ischemic changes.      Hyperlipidemia --Continue lipitor   Essential hypertension - overall BP's stable --Cont home regimen  --Titrate regimen   Generalized anxiety  disorder --Continue Wellbutrin    Hypothyroidism -Continue levothyroxine    Peripheral neuropathy -Continue Lyrica    History of pulmonary embolism 2003-not on anticoagulation anymore  - Repeat VQ scan from 07/15/2024 no evidence of pulmonary embolism.  Appears resolved.      Subjective: Pt seen virtually during AM Medic visit today.  She reports feel well, better.  Slept very well.  Denies chest pain.  Has some mild dyspnea on exertion but this is improving.  No fever/chills.  Eating & drinking well.  No other acute complaints.    Physical Exam: Vitals:   07/29/24 1600 07/29/24 2000 07/30/24 0715 07/30/24 1100  BP: (!) 147/82 (!) 154/86  (!) 131/93  Pulse: 70 73 (!) 53 (!) 58  Resp: 18 16 18 16   Temp:    (!) 97.5 F (36.4 C)  TempSrc:    Oral  SpO2: 93% 94% 90% 95%  Weight:    69.5 kg  Height:       General exam: awake, alert, no acute distress Respiratory system: lungs clear upper b/l, mildly diminished bases but with improved aeration noted today, no expiratory wheezes, rales or rhonchi, normal respiratory effort at rest on room air Cardiovascular system: normal S1/S2, RRR, no edema.   Gastrointestinal system: soft, NT, ND Central nervous system: A&O x3. no gross focal neurologic deficits, normal speech Skin: dry, intact, normal temperature & no rashes on visualized skin Psychiatry: normal mood, congruent affect, judgement and insight appear normal   Data Reviewed:  Notable labs from  --   Cr improved 1.26 >> 1.05. Otherwise BMP normal on 8/22  WBC 14.6 >> 17.0 >> 15.8 (on prednisone )  Platelets elevated 481 >> 463 likely reactive due to infection     Disposition: Status is: Inpatient Remains inpatient appropriate because: warrants further IV antibiotics given recurrent PNA and complicating features outlined above.   Planned Discharge Destination: Home    Time spent: 45 minutes  Author: Burnard DELENA Cunning, DO 07/30/2024 5:22 PM  For on call review  [email or web address].

## 2024-07-30 NOTE — Progress Notes (Signed)
 Spoke with patient via phone and then via camera. Pt states she did not wear her oxygen at night. She felt it was too loud and didn't feel any air flow and couldn't sleep with it on. Will have paramedic Sarah try to trouble shoot on visit this am. Pt states she slept well.

## 2024-07-31 ENCOUNTER — Inpatient Hospital Stay (HOSPITAL_COMMUNITY)

## 2024-07-31 DIAGNOSIS — I160 Hypertensive urgency: Secondary | ICD-10-CM

## 2024-07-31 LAB — CULTURE, BLOOD (ROUTINE X 2)
Culture: NO GROWTH
Culture: NO GROWTH
Special Requests: ADEQUATE

## 2024-07-31 MED ORDER — CIPROFLOXACIN HCL 500 MG PO TABS: 500.0000 mg | ORAL_TABLET | Freq: Two times a day (BID) | ORAL | 0 refills | Status: AC

## 2024-07-31 MED ORDER — LOSARTAN POTASSIUM 100 MG PO TABS: 100.0000 mg | ORAL_TABLET | Freq: Every day | ORAL | 1 refills | Status: DC

## 2024-07-31 MED ORDER — LOSARTAN POTASSIUM 50 MG PO TABS
100.0000 mg | ORAL_TABLET | Freq: Every day | ORAL | Status: DC
  Administered 2024-07-31 – 2024-08-01 (×2): 100 mg via ORAL
  Filled 2024-07-31 (×3): qty 2

## 2024-07-31 MED ORDER — HYDRALAZINE HCL 50 MG PO TABS
50.0000 mg | ORAL_TABLET | Freq: Four times a day (QID) | ORAL | Status: DC | PRN
  Filled 2024-07-31 (×4): qty 1

## 2024-07-31 MED ORDER — PREGABALIN 50 MG PO CAPS: 100.0000 mg | ORAL_CAPSULE | Freq: Every day | ORAL | 0 refills | Status: DC

## 2024-07-31 MED ORDER — BUDESON-GLYCOPYRROL-FORMOTEROL 160-9-4.8 MCG/ACT IN AERO: 2.0000 | INHALATION_SPRAY | Freq: Two times a day (BID) | RESPIRATORY_TRACT | 1 refills | Status: DC

## 2024-07-31 MED ORDER — AMLODIPINE BESYLATE 5 MG PO TABS
7.5000 mg | ORAL_TABLET | Freq: Once | ORAL | Status: AC
  Administered 2024-07-31: 7.5 mg via ORAL

## 2024-07-31 NOTE — Progress Notes (Signed)
 Spoke with pt via phone and video. Pt self administered synthroid  and Breztri . Pt states she had a good night. VSS.Informed her of planned paramedic visit between 10 and 11 and pt agreeable.

## 2024-07-31 NOTE — Plan of Care (Signed)
  Problem: Education: Goal: Knowledge of General Education information will improve Description: Including pain rating scale, medication(s)/side effects and non-pharmacologic comfort measures Outcome: Adequate for Discharge   Problem: Health Behavior/Discharge Planning: Goal: Ability to manage health-related needs will improve Outcome: Adequate for Discharge   Problem: Clinical Measurements: Goal: Ability to maintain clinical measurements within normal limits will improve Outcome: Adequate for Discharge Goal: Will remain free from infection Outcome: Adequate for Discharge Goal: Diagnostic test results will improve Outcome: Adequate for Discharge Goal: Respiratory complications will improve Outcome: Adequate for Discharge Goal: Cardiovascular complication will be avoided Outcome: Adequate for Discharge   Problem: Activity: Goal: Risk for activity intolerance will decrease Outcome: Adequate for Discharge   Problem: Activity: Goal: Risk for activity intolerance will decrease Outcome: Adequate for Discharge   Problem: Nutrition: Goal: Adequate nutrition will be maintained Outcome: Adequate for Discharge   Problem: Coping: Goal: Level of anxiety will decrease Outcome: Adequate for Discharge   Problem: Elimination: Goal: Will not experience complications related to bowel motility Outcome: Adequate for Discharge Goal: Will not experience complications related to urinary retention Outcome: Adequate for Discharge   Problem: Pain Managment: Goal: General experience of comfort will improve and/or be controlled Outcome: Adequate for Discharge   Problem: Safety: Goal: Ability to remain free from injury will improve Outcome: Adequate for Discharge   Problem: Skin Integrity: Goal: Risk for impaired skin integrity will decrease Outcome: Adequate for Discharge   Problem: Activity: Goal: Ability to tolerate increased activity will improve Outcome: Adequate for Discharge    Problem: Clinical Measurements: Goal: Ability to maintain a body temperature in the normal range will improve Outcome: Adequate for Discharge   Problem: Respiratory: Goal: Ability to maintain adequate ventilation will improve Outcome: Adequate for Discharge Goal: Ability to maintain a clear airway will improve Outcome: Adequate for Discharge

## 2024-07-31 NOTE — Progress Notes (Incomplete)
 I arrived at the patients apartment (D), the purpose of the visit is for the PM encounter.   Patient contact was made at the door. The patient was ambulating. ABCs are intact.   Upon physical exam, no changes noted. H

## 2024-07-31 NOTE — Plan of Care (Signed)
  Problem: Education: Goal: Knowledge of General Education information will improve Description: Including pain rating scale, medication(s)/side effects and non-pharmacologic comfort measures Outcome: Progressing   Problem: Health Behavior/Discharge Planning: Goal: Ability to manage health-related needs will improve Outcome: Progressing   Problem: Clinical Measurements: Goal: Ability to maintain clinical measurements within normal limits will improve Outcome: Progressing Goal: Diagnostic test results will improve Outcome: Progressing Goal: Respiratory complications will improve Outcome: Progressing   Problem: Nutrition: Goal: Adequate nutrition will be maintained Outcome: Progressing   Problem: Coping: Goal: Level of anxiety will decrease Outcome: Progressing   Problem: Elimination: Goal: Will not experience complications related to bowel motility Outcome: Progressing Goal: Will not experience complications related to urinary retention Outcome: Progressing   Problem: Pain Managment: Goal: General experience of comfort will improve and/or be controlled Outcome: Progressing   Problem: Safety: Goal: Ability to remain free from injury will improve Outcome: Progressing   Problem: Skin Integrity: Goal: Risk for impaired skin integrity will decrease Outcome: Progressing   Problem: Activity: Goal: Ability to tolerate increased activity will improve Outcome: Progressing   Problem: Clinical Measurements: Goal: Ability to maintain a body temperature in the normal range will improve Outcome: Progressing   Problem: Respiratory: Goal: Ability to maintain adequate ventilation will improve Outcome: Progressing Goal: Ability to maintain a clear airway will improve Outcome: Progressing

## 2024-07-31 NOTE — Progress Notes (Signed)
 Phone call completed with patient who has just arrived back home after MRI test at [facility]. Patient reports no significant symptom change. She states that vision is still hazy in her right eye. No other pain or complaints at this time. Plan made for patient to call RN around 21:30 pm to complete bedtime medications.

## 2024-07-31 NOTE — Progress Notes (Signed)
 Contacted pt via camera. Pt was able to place the automatic blood pressure cuff and take her bp. 186/96. Paramedic is to return and administer losartan  po per MD.

## 2024-07-31 NOTE — Progress Notes (Signed)
 No phone calls from patient overnight. Patient did have a few current health alarms for oxygen level ranging from 85-88% at times. Oxygen level currently 93%. Paramedic had to make an additional trip to patient's home due to bedtime Lyrica  dose not being available. Patient's home supply had to be used as patient stated she would not be able to sleep without it. Repeat blood pressure when paramedic was in home at that time was 165/92.

## 2024-07-31 NOTE — Discharge Summary (Signed)
 Physician Discharge Summary   Patient: Cheyenne Lucero MRN: [id number] DOB: 07/10/1961  Admit date:     07/26/2024  Discharge date: 08/10/24  Discharge Physician: Burnard DELENA Cunning   PCP: Burney Darice CROME, MD   Patient identified themself as Cheyenne Lucero  DOB 1961/04/17  Medic Norman Miner present at bedside during encounter and performed the physical exam. Patient was seen today via video chat; my physical location [place], [place]   Recommendations at discharge:   Follow up as scheduled with Cardiology Follow up with Ophthalmology - referral sent to Dr. Gust, patient reported was prior ophthalmologist when seen for eye symptoms related to Graves disease years ago Follow up with Pulmonology - referral sent Repeat CT chest in 2 weeks to follow resolution of pneumonia Follow up with Primary Care in 1-2 weeks Follow up on blood pressure management and adjust regimen as needed.  BP control has been challenging with escalation of antihypertensives needed this admission. Follow pending TSH, T4, T3, ESR, CRP Consider recurrence of Graves disease with flare as possible etiology for new onset vision changes and accelerated hypertension.  Consider endocrinology referral / follow up.   Recommend outpatient sleep study to assess for sleep apnea   Discharge Diagnoses: Principal Problem:   CAP (community acquired pneumonia) Active Problems:   Hyperthyroidism   Essential hypertension   Elevated troponin   History of essential hypertension   History of COPD   History of pulmonary embolism-resolved   Hyperlipidemia   Hypertensive urgency   Visual disturbances  Resolved Problems:   * No resolved hospital problems. *  Hospital Course:  Cornisha Remee Charley is a 63 y.o. female with hx of COPD, history of previous PE-resolution on VQ scan 07/17/2024, acquired hypothyroidism, essential hypertension, HLD, CKD3a and depression recent admission 8/6-8/10 treated for CAP and COPD  exacerbation. Seen by PCP with persistent SOB, chest pain. and noted to have high WBC, low normal BP referred to ED. Concern for ongoing / relapsed CAP. CT Chest with question of LLL / lingular pna v atelectasis and small L pleural effusion, underlying mass not excluded. Treated with Vanc / Cefepime , 1 L IVF.    [facility] for management recurrent episodes of pneumonia.   At present, pt reports clinical improvement in shortness or breath as well as cough though they still persist to a degree. Minmal wheezing. No abdominal pain or diarrhea t present. Pt no longer smokes.      8/21 - After extensive discussion at the bedside, patient is agreeable to transfer to the hospital at home program.   8/22 - sputum culture growing pseudomonas sensitive to cefepime  and rare GPC's without identified organism.  Pt clinically feels improved.  Overnight O2 desats to 87% noted.   8/23 - pt didn't tolerate O2 overnight.  Pt feeling better, mild dyspnea on exertion, overall feeling much improved since admission this time.  8/24 - BP's severely elevated as high as systolic 200's this AM.  Medication regimen escalated.  MRI brain obtained for evaluation of vision changes was normal.  8/25 - BP's have improved but remain above goal.  Vision symptoms reported to be 80% better today.  Patient is medically stable for discharge from Hospital at Home today, with close outpatient follow up as outlined above.   Assessment and Plan:  Recurrent/Persistent pneumonia Left Pleural Effusion CT chest showed similar or slightly progressed left pleural effusion and left lower lobe and lingular atelectasis.  Pneumonia or underlying mass  is not excluded. Continued follow-up to resolution recommended Noted recurrence of symptoms after admission for similar issue 8/6-->8/10.  Discharge summary of 8/10 reviewed.   Sputum culture with +Pseudomonas and rare GPC's not yet  identified Blood cx negative    -- Treated with IV Cefepime  --discharge on PO Cipro  for 10 more days, for total 2 weeks course given pleural effusion and pericarditis, prolonged course of illness --d/c Vancomycin  --Repeat CT chest as outpatient in 2 weeks to evaluate for resolution. --Referral sent for outpatient pulmonology in setting of recurrent pneumonia, pleural effusion, baseline COPD, and need for repeat CT chest    --Supplment O2 if spO2 < 90% on room air --Completed course of prednisone   --Duonebs PRN --Mucinex  BID PRN   Hypertensive Urgency - 8/24 AM BP's elevated to 180-200 systolic. Essential hypertension - --BP regimen required significant escalation during admission.  Discharging on meds listed below. --Pt has close follow up already scheduled with Cardiology  --Titrate regimen --Follow up thyroid  studies to assess ?recurrent Graves dz   Parapneumonic Pericarditis (presumed) -- diagnosed during initial admission for PNA. Seen by Cardiology, notes reviewed. Pt denies ongoing chest pain, has a 'twinge' here and there nothing like she'd been having at the time. --Continue colchicine   Cardiology recommended long 6 week to 3 month course --Cardiology outpatient follow up    Nocturnal hypoxia - noted first overnight at home with sat 87% on room air.  O2 concentrator brought to the home, pt attempted to use but found the noise bothersome, and nasal cannula irritating. --Recommend outpatient sleep study for ?sleep apnea --If pt remains admitted tomorrow night, will attempt a nocturnal pulse ox study --Will have pulmonology follow up      COPD - overall stable with minimal wheezing on admission, now improved --Treated with Breztri  and duonebs prn.  --Due to cost, will d/c with Incruse and Symbicort  --Hopefully Pulmology can get auth for breztri  or trelegy --Completed prednisone  in setting of recurrent pneumonia --No further steroids needed at d/c --Pulmonology referral sent  for outpatient follow up    Elevated troponin.  Noted 8/19 Trop 20s- appears chronic/baseline  No chest pain.  EKG with no acute ischemic changes.   Hypothyroidism -Continue levothyroxine  --Thyroid  studies drawn today - follow results  History of Graves disease s/p ablation in 2019 ?if new onset vision changes, accelerated HTN are explained by recurrence of Graves with flare --Check TSH, free T4, ESR, CRP --Close PCP follow up for further management --Continue current levothyroxine  dose for now --On metoprolol  for BP as above   History of pulmonary embolism 2003- No longer on anticoagulation  Repeat VQ scan from 07/15/2024 no evidence of pulmonary embolism.  Appears resolved.  Peripheral neuropathy -Continue Lyrica    Hyperlipidemia --Continue lipitor   Generalized anxiety disorder --Continue Wellbutrin        Consultants: None Procedures performed: None  Disposition: Home Diet recommendation:  Regular diet DISCHARGE MEDICATION: Allergies as of 08/01/2024       Reactions   Morphine And Codeine Anaphylaxis   Ultram  [tramadol ] Nausea And Vomiting        Medication List     STOP taking these medications    amLODipine  2.5 MG tablet Commonly known as: NORVASC    losartan -hydrochlorothiazide 100-25 MG tablet Commonly known as: HYZAAR       TAKE these medications    acetaminophen  500 MG tablet Commonly known as: TYLENOL  Take 500-1,000 mg by mouth 2 (two) times daily as needed for moderate pain (pain score 4-6), fever or headache.  albuterol  108 (90 Base) MCG/ACT inhaler Commonly known as: VENTOLIN  HFA Inhale 2 puffs into the lungs every 6 (six) hours as needed for wheezing or shortness of breath.   aspirin  EC 81 MG tablet Take 81 mg by mouth daily.   atorvastatin  40 MG tablet Commonly known as: LIPITOR Take 1 tablet (40 mg total) by mouth at bedtime.   buPROPion  150 MG 24 hr tablet Commonly known as: WELLBUTRIN  XL Take 150 mg by mouth daily.    ciprofloxacin  500 MG tablet Commonly known as: Cipro  Take 1 tablet (500 mg total) by mouth 2 (two) times daily for 10 days.   cloNIDine  0.2 MG tablet Commonly known as: CATAPRES  Take 1 tablet (0.2 mg total) by mouth See admin instructions. Take 1 tablet (0.2mg ) by mouth at bedtime What changed: additional instructions   colchicine  0.6 MG tablet Take 1 tablet (0.6 mg total) by mouth daily.   cyanocobalamin  1000 MCG tablet Take 1 tablet (1,000 mcg total) by mouth daily. Notes to patient: Tomorrow 8/26   ezetimibe  10 MG tablet Commonly known as: ZETIA  Take 10 mg by mouth daily. Notes to patient: Tomorrow 8/26   guaiFENesin -dextromethorphan 100-10 MG/5ML syrup Commonly known as: ROBITUSSIN DM Take 10 mLs by mouth every 6 (six) hours as needed for cough.   hydrALAZINE  100 MG tablet Commonly known as: APRESOLINE  Take 1 tablet (100 mg total) by mouth every 8 (eight) hours.   Incruse Ellipta  62.5 MCG/ACT Aepb Generic drug: umeclidinium bromide  Inhale 1 puff into the lungs daily.   levothyroxine  100 MCG tablet Commonly known as: SYNTHROID  Take 100 mcg by mouth daily before breakfast. Notes to patient: Tomorrow 8/24   losartan  100 MG tablet Commonly known as: COZAAR  Take 1 tablet (100 mg total) by mouth daily.   metoprolol  tartrate 50 MG tablet Commonly known as: LOPRESSOR  Take 25 mg by mouth 2 (two) times daily.   mirtazapine  30 MG tablet Commonly known as: REMERON  Take 30 mg by mouth at bedtime.   pregabalin  50 MG capsule Commonly known as: LYRICA  Take 2 capsules (100 mg total) by mouth at bedtime.   Symbicort  80-4.5 MCG/ACT inhaler Generic drug: budesonide -formoterol  Inhale 2 puffs into the lungs 2 (two) times daily.   Vitamin D3 125 MCG (5000 UT) Caps Take 5,000 Units by mouth daily.        Follow-up Information     Burney Darice CROME, MD Follow up.   Specialty: Family Medicine Contact information: [address] [place]  [address] [phone]                Discharge Exam: Fredricka Weights   07/28/24 1700 07/29/24 1126 07/30/24 1100  Weight: 68.6 kg 69.1 kg 69.5 kg   General exam: awake, alert, no acute distress Respiratory system: lungs CTAB, on room air, normal respiratory effort. Cardiovascular system:no pedal edema.   RRR Central nervous system: A&O x4. no gross focal neurologic deficits, normal speech Extremities: moves all, normal tone Skin: dry, intact,brisk cap refill Psychiatry: normal mood, congruent affect, judgement and insight appear normal   Condition at discharge: stable  The results of significant diagnostics from this hospitalization (including imaging, microbiology, ancillary and laboratory) are listed below for reference.   Imaging Studies: ECHOCARDIOGRAM LIMITED Result Date: 08/03/2024    ECHOCARDIOGRAM LIMITED REPORT   Patient Name:   Cheyenne Lucero [facility] Date of Exam: 08/03/2024 Medical Rec #:  [id number]          Height:       64.0 in  Accession #:    [id number]         Weight:       153.2 lb Date of Birth:  21-Mar-1961         BSA:          1.747 m Patient Age:    62 years           BP:           140/90 mmHg Patient Gender: F                  HR:           63 bpm. Exam Location:  [address] Procedure: 2D Echo, Limited Echo, Limited Color Doppler and Cardiac Doppler            (Both Spectral and Color Flow Doppler were utilized during            procedure). Indications:    I31.3 Pericardial Effusion  History:        Patient has prior history of Echocardiogram examinations, most                 recent 07/14/2024. CAD, COPD; Risk Factors:Hypertension. Pulmonary                 Embolus.  Sonographer:    Carl Rodgers-Jones RDCS Referring Phys: [id number] MIHAI CROITORU IMPRESSIONS  1. Limited study to R/O pericardial effusion; trivial effusion noted in subcostal views.  2. Left ventricular ejection fraction, by estimation, is 60 to 65%. The left ventricle has normal function. The left ventricle  has no regional wall motion abnormalities. Left ventricular diastolic parameters are consistent with Grade I diastolic dysfunction (impaired relaxation).  3. Right ventricular systolic function is normal. The right ventricular size is normal.  4. The mitral valve is normal in structure. Trivial mitral valve regurgitation.  5. The aortic valve is tricuspid. Aortic valve regurgitation is not visualized. No aortic stenosis is present.  6. The inferior vena cava is normal in size with greater than 50% respiratory variability, suggesting right atrial pressure of 3 mmHg. FINDINGS  Left Ventricle: Left ventricular ejection fraction, by estimation, is 60 to 65%. The left ventricle has normal function. The left ventricle has no regional wall motion abnormalities. The left ventricular internal cavity size was normal in size. There is  no left ventricular hypertrophy. Left ventricular diastolic parameters are consistent with Grade I diastolic dysfunction (impaired relaxation). Right Ventricle: The right ventricular size is normal. Right ventricular systolic function is normal. Left Atrium: Left atrial size was normal in size. Right Atrium: Right atrial size was normal in size. Pericardium: Trivial pericardial effusion is present. Mitral Valve: The mitral valve is normal in structure. Trivial mitral valve regurgitation. Tricuspid Valve: The tricuspid valve is normal in structure. Tricuspid valve regurgitation is trivial. No evidence of tricuspid stenosis. Aortic Valve: The aortic valve is tricuspid. Aortic valve regurgitation is not visualized. No aortic stenosis is present. Pulmonic Valve: The pulmonic valve was grossly normal. Aorta: The aortic root is normal in size and structure. Venous: The inferior vena cava is normal in size with greater than 50% respiratory variability, suggesting right atrial pressure of 3 mmHg. Additional Comments: Limited study to R/O pericardial effusion; trivial effusion noted in subcostal views.   LEFT VENTRICLE PLAX 2D LVIDd:         4.00 cm Diastology LVIDs:         2.10 cm LV e' medial:    6.52 cm/s LV PW:  0.90 cm LV E/e' medial:  8.9 LV IVS:        0.80 cm LV e' lateral:   10.09 cm/s                        LV E/e' lateral: 5.7  RIGHT VENTRICLE             IVC RV S prime:     10.80 cm/s  IVC diam: 1.40 cm TAPSE (M-mode): 2.1 cm LEFT ATRIUM         Index LA diam:    3.70 cm 2.12 cm/m   AORTA Ao Root diam: 3.10 cm MITRAL VALVE MV Area (PHT): 2.89 cm MV Decel Time: 263 msec MV E velocity: 57.90 cm/s MV A velocity: 71.50 cm/s MV E/A ratio:  0.81 Redell Shallow MD Electronically signed by Redell Shallow MD Signature Date/Time: 08/03/2024/8:25:37 AM    Final    MR BRAIN WO CONTRAST Result Date: 07/31/2024 CLINICAL DATA:  Accelerated Hypertension and vision changes EXAM: MRI HEAD WITHOUT CONTRAST TECHNIQUE: Multiplanar, multiecho pulse sequences of the brain and surrounding structures were obtained without intravenous contrast. COMPARISON:  CT head Apr 13, 2018. FINDINGS: Brain: No acute infarction, hemorrhage, hydrocephalus, extra-axial collection or mass lesion. Vascular: Normal flow voids. Skull and upper cervical spine: Normal marrow signal. Sinuses/Orbits: Clear sinuses.  No acute orbital findings. IMPRESSION: No evidence of acute intracranial abnormality Electronically Signed   By: Gilmore GORMAN Molt M.D.   On: 07/31/2024 19:38   CT Chest Wo Contrast Result Date: 07/26/2024 CLINICAL DATA:  Pneumonia. EXAM: CT CHEST WITHOUT CONTRAST TECHNIQUE: Multidetector CT imaging of the chest was performed following the standard protocol without IV contrast. RADIATION DOSE REDUCTION: This exam was performed according to the departmental dose-optimization program which includes automated exposure control, adjustment of the mA and/or kV according to patient size and/or use of iterative reconstruction technique. COMPARISON:  Chest CT dated 07/13/2024. FINDINGS: Evaluation of this exam is limited in the absence  of intravenous contrast. Cardiovascular: There is no cardiomegaly. Small pericardial effusion. Mild atherosclerotic calcification of the thoracic aorta. No aneurysmal dilatation. The central pulmonary arteries are grossly unremarkable. Mediastinum/Nodes: No hernia or or mediastinal adenopathy. The esophagus is grossly unremarkable. No mediastinal fluid collection. Lungs/Pleura: Similar size small left pleural effusion. There is partial compressive atelectasis of the left lower lobe and lingula similar or slightly progressed since the prior CT. Pneumonia or underlying mass is not excluded. Continued follow-up to resolution recommended. There is background of centrilobular emphysema. No pneumothorax. The central airways are patent. Upper Abdomen: No acute abnormality.  Fatty liver. Musculoskeletal: Degenerative changes of the spine and scoliosis. No acute osseous pathology. IMPRESSION: 1. Similar or slightly progressed left pleural effusion and left lower lobe and lingular atelectasis. Pneumonia or underlying mass is not excluded. Continued follow-up to resolution recommended. 2. Aortic Atherosclerosis (ICD10-I70.0) and Emphysema (ICD10-J43.9). Electronically Signed   By: Vanetta Chou M.D.   On: 07/26/2024 19:09   DG Chest 2 View Result Date: 07/26/2024 CLINICAL DATA:  Chest pain EXAM: CHEST - 2 VIEW COMPARISON:  Chest x-ray 07/15/2024 FINDINGS: There is a new moderate-sized left pleural effusion. Can not exclude underlying left basilar atelectasis/airspace disease. The heart is enlarged, unchanged. Right lung is clear. Aorta is ectatic. There is no pneumothorax or acute fracture. IMPRESSION: New moderate-sized left pleural effusion. Can not exclude underlying left basilar atelectasis/airspace disease. Electronically Signed   By: Greig Pique M.D.   On: 07/26/2024 17:45   NM Pulmonary Perfusion  Result Date: 07/15/2024 EXAM: NM Lung Perfusion Scan. CLINICAL HISTORY: Pulmonary embolism (PE) suspected, high  prob; sob, pleuritic chest pain, elevated d-dimer; remote h/o PE. Pt has been experiencing SOB that has been ongoing since Sunday. She quit smoking 6 years ago and has a hx of PE and DVT. TECHNIQUE: Radiolabeled MAA was administered intravenously and planar images of the lungs were obtained in multiple projections. RADIOPHARMACEUTICAL: 4.4 millicurie (technetium albumin aggregated (MAA) injection solution 4.4 millicurie TECHNETIUM TO 99M ALBUMIN AGGREGATED) COMPARISON: Chest radiograph from 07/15/2024. FINDINGS: PERFUSION: No segmental perfusion defects identified. Mild non-segmental decreased radiotracer activity is identified within the apical segment of the right upper lobe. IMPRESSION: 1. No segmental perfusion defects to indicate pulmonary emboli. 2. Mild non-segmental decreased radiotracer activity within the apical segment of the right upper lobe. 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 Electronically signed by: Taylor Stroud MD 07/15/2024 04:33 PM EDT RP Workstation: [id number]   DG CHEST PORT 1 VIEW Result Date: 07/15/2024 CLINICAL DATA:  Possible community-acquired pneumonia. EXAM: PORTABLE CHEST 1 VIEW COMPARISON:  07/13/2024 FINDINGS: Lungs are adequately inflated demonstrate persistent left base/retrocardiac opacification likely slightly worse. Mild blunting of the left costophrenic angle which may represent a small amount of pleural fluid. Cardiomediastinal silhouette and remainder of the exam is unchanged. IMPRESSION: Left base/retrocardiac opacification likely slightly worse and may be due to infection versus atelectasis. Possible small amount of left pleural fluid. Electronically Signed   By: Daniel  Boyle M.D.   On: 07/15/2024 09:46   ECHOCARDIOGRAM COMPLETE Result Date: 07/14/2024 C     ECHOCARDIOGRAM REPORT   Patient Name:   Cheyenne Lucero Date of Exam: 07/14/2024 Medical Rec #:  [id number]           Height:       64.0 in Accession #:    [id number]         Weight:       155.7 lb Date of Birth:  06/27/1961         BSA:          1.759 m Patient Age:    62 years           BP:           144/68 mmHg Patient Gender: F                  HR:           87  bpm. Exam Location:  Inpatient Procedure: 2D Echo, Cardiac Doppler and Color Doppler (Both Spectral and Color            Flow Doppler were utilized during procedure). Indications:    Chest Pain R07.9  History:        Patient has prior history of Echocardiogram examinations, most                 recent 09/02/2019. COPD, Signs/Symptoms:Chest Pain; Risk                 Factors:Hypertension.  Sonographer:    Thea Norlander RCS Referring Phys: [id number] JUSTIN B HOWERTER IMPRESSIONS  1. Left ventricular ejection fraction, by estimation, is 60 to 65%. The left ventricle has normal function. Left ventricular endocardial border not optimally defined to evaluate regional wall motion. Left ventricular diastolic parameters are consistent with Grade I diastolic dysfunction (impaired relaxation).  2. Right ventricular systolic function is normal. The right ventricular size is normal.  3. The mitral  valve is normal in structure. No evidence of mitral valve regurgitation. No evidence of mitral stenosis.  4. The aortic valve is tricuspid. Aortic valve regurgitation is trivial. Aortic valve sclerosis/calcification is present, without any evidence of aortic stenosis. Aortic valve Vmax measures 1.63 m/s.  5. A small pericardial effusion is present. The pericardial effusion is circumferential. The pericardial effusion appears to contain fibrous material.  6. Recommend repeat limited study with definity contrast for further evaluation of wall motion. Consider cardiac MRI for further visualization of the pericardium FINDINGS  Left Ventricle: Left ventricular ejection fraction, by estimation, is 60 to 65%. The left ventricle has normal function. Left ventricular endocardial border not  optimally defined to evaluate regional wall motion. The left ventricular internal cavity size was normal in size. There is no left ventricular hypertrophy. Left ventricular diastolic parameters are consistent with Grade I diastolic dysfunction (impaired relaxation). Normal left ventricular filling pressure. Right Ventricle: The right ventricular size is normal. No increase in right ventricular wall thickness. Right ventricular systolic function is normal. Left Atrium: Left atrial size was normal in size. Right Atrium: Right atrial size was normal in size. Pericardium: A small pericardial effusion is present. The pericardial effusion is circumferential. The pericardial effusion appears to contain fibrous material. Mitral Valve: The mitral valve is normal in structure. No evidence of mitral valve regurgitation. No evidence of mitral valve stenosis. Tricuspid Valve: The tricuspid valve is normal in structure. Tricuspid valve regurgitation is not demonstrated. No evidence of tricuspid stenosis. Aortic Valve: The aortic valve is tricuspid. Aortic valve regurgitation is trivial. Aortic valve sclerosis/calcification is present, without any evidence of aortic stenosis. Aortic valve peak gradient measures 10.6 mmHg. Pulmonic Valve: The pulmonic valve was normal in structure. Pulmonic valve regurgitation is not visualized. No evidence of pulmonic stenosis. Aorta: The aortic root is normal in size and structure. Venous: The inferior vena cava was not well visualized. IAS/Shunts: No atrial level shunt detected by color flow Doppler.  LEFT VENTRICLE PLAX 2D LVIDd:         4.40 cm   Diastology LVIDs:         3.10 cm   LV e' medial:    9.45 cm/s LV PW:         1.10 cm   LV E/e' medial:  6.3 LV IVS:        0.80 cm   LV e' lateral:   13.30 cm/s LVOT diam:     2.10 cm   LV E/e' lateral: 4.5 LV SV:         69 LV SV Index:   39 LVOT Area:     3.46 cm  RIGHT VENTRICLE RV S prime:     16.70 cm/s LEFT ATRIUM           Index LA diam:       3.50 cm 1.99 cm/m LA Vol (A4C): 37.7 ml 21.43 ml/m  AORTIC VALVE AV Area (Vmax): 2.45 cm AV Vmax:        163.00 cm/s AV Peak Grad:   10.6 mmHg LVOT Vmax:      115.50 cm/s LVOT Vmean:     78.350 cm/s LVOT VTI:       0.198 m  AORTA Ao Root diam: 3.40 cm Ao Asc diam:  3.50 cm MITRAL VALVE MV Area (PHT): 3.46 cm    SHUNTS MV Decel Time: 219 msec    Systemic VTI:  0.20 m MV E velocity: 59.70 cm/s  Systemic Diam: 2.10 cm MV A  velocity: 83.30 cm/s MV E/A ratio:  0.72 Wilbert Bihari MD Electronically signed by Wilbert Bihari MD Signature Date/Time: 07/14/2024/5:11:12 PM    Final    CT CHEST ABDOMEN PELVIS WO CONTRAST Result Date: 07/13/2024 CLINICAL DATA:  Abdominal pain, acute, nonlocalized Acute kidney injury 4-day history of some cough some exertional shortness of breath and chest pain across the upper left and right side of the chest. Shortness make is worse with the coughing. HX COPD EXAM: CT CHEST, ABDOMEN AND PELVIS WITHOUT CONTRAST TECHNIQUE: Multidetector CT imaging of the chest, abdomen and pelvis was performed following the standard protocol without IV contrast. RADIATION DOSE REDUCTION: This exam was performed according to the departmental dose-optimization program which includes automated exposure control, adjustment of the mA and/or kV according to patient size and/or use of iterative reconstruction technique. COMPARISON:  None Available. FINDINGS: CT CHEST FINDINGS Cardiovascular: Normal heart size. Small volume pericardial effusion. The thoracic aorta is normal in caliber. No atherosclerotic plaque of the thoracic aorta. No coronary artery calcifications. Mediastinum/Nodes: No enlarged mediastinal, hilar, or axillary lymph nodes. Thyroid  gland, trachea, and esophagus demonstrate no significant findings. Lungs/Pleura: Moderate centrilobular emphysematous changes. Left lower lobe atelectasis. No pulmonary nodule. No pulmonary mass. Trace a small volume left pleural effusion. No pneumothorax. Musculoskeletal:  No chest wall abnormality. No suspicious lytic or blastic osseous lesions. No acute displaced fracture. Dextroscoliosis of the thoracolumbar spine. CT ABDOMEN PELVIS FINDINGS Hepatobiliary: The hepatic parenchyma is diffusely hypodense compared to the splenic parenchyma consistent with fatty infiltration. No focal liver abnormality. No gallstones, gallbladder wall thickening, or pericholecystic fluid. No biliary dilatation. Pancreas: No focal lesion. Normal pancreatic contour. No surrounding inflammatory changes. No main pancreatic ductal dilatation. Spleen: Normal in size without focal abnormality. Adrenals/Urinary Tract: No adrenal nodule bilaterally. No nephrolithiasis and no hydronephrosis. Fluid density lesions likely represent simple renal cysts. Simple renal cysts, in the absence of clinically indicated signs/symptoms, require no independent follow-up. No ureterolithiasis or hydroureter. The urinary bladder is unremarkable. Stomach/Bowel: Stomach is within normal limits. No evidence of bowel wall thickening or dilatation. Stool throughout the majority of the colon. Colonic diverticulosis. Appendix appears normal. Vascular/Lymphatic: No abdominal aorta or iliac aneurysm. Mild atherosclerotic plaque of the aorta and its branches. No abdominal, pelvic, or inguinal lymphadenopathy. Reproductive: Uterus and bilateral adnexa are unremarkable. Other: No intraperitoneal free fluid. No intraperitoneal free gas. No organized fluid collection. Musculoskeletal: No abdominal wall hernia or abnormality. No suspicious lytic or blastic osseous lesions. No acute displaced fracture. Levoscoliosis of the lumbar spine centered at the L3-L4 level with associated degenerative changes. IMPRESSION: 1. Small volume pericardial effusion. 2. Left lower lobe atelectasis with superimposed infection not excluded. Limited evaluation on this noncontrast study. 3. Trace too small volume left pleural effusion. 4. Hepatic steatosis. 5. Colonic  diverticulosis with no acute diverticulitis. 6. Aortic Atherosclerosis (ICD10-I70.0) and Emphysema (ICD10-J43.9). Electronically Signed   By: Morgane  Naveau M.D.   On: 07/13/2024 15:49   DG Chest 2 View Result Date: 07/13/2024 CLINICAL DATA:  chest pain EXAM: CHEST - 2 VIEW COMPARISON:  February 23, 2018 FINDINGS: Small left pleural effusion with patchy left basilar airspace opacities, likely atelectasis. Mild cardiomegaly. Tortuous aorta with aortic atherosclerosis. Moderate dextrocurvature of the thoracic spine, unchanged. No acute fracture or destructive lesions. Multilevel thoracic osteophytosis. IMPRESSION: Small left pleural effusion. Patchy left basilar airspace opacities, which may represent atelectasis or a developing bronchopneumonia, in the correct clinical context. Electronically Signed   By: Rogelia Myers M.D.   On: 07/13/2024 14:27  Microbiology: Results for orders placed or performed during the hospital encounter of 07/26/24  Culture, blood (Routine X 2) w Reflex to ID Panel     Status: None   Collection Time: 07/26/24  6:21 PM   Specimen: BLOOD LEFT HAND  Result Value Ref Range Status   Specimen Description   Final    BLOOD LEFT HAND Performed at [facility], [address] [place], [place] [address]    Special Requests   Final    BOTTLES DRAWN AEROBIC AND ANAEROBIC Blood Culture adequate volume Performed at [facility], [address], [place], [place] [address]    Culture   Final    NO GROWTH 5 DAYS Performed at [facility], [address] [place], [place] [address]    Report Status 07/31/2024 FINAL  Final  Culture, blood (Routine X 2) w Reflex to ID Panel     Status: None   Collection Time: 07/26/24  6:21 PM   Specimen: BLOOD  Result Value Ref Range Status   Specimen Description   Final    BLOOD RIGHT ANTECUBITAL Performed at [facility], [address] [place], [place] [address]    Special  Requests   Final    BOTTLES DRAWN AEROBIC AND ANAEROBIC Blood Culture results may not be optimal due to an inadequate volume of blood received in culture bottles Performed at [facility], [address], [place], [place] [address]    Culture   Final    NO GROWTH 5 DAYS Performed at [facility], [address], [place], [place] [address]    Report Status 07/31/2024 FINAL  Final  Expectorated Sputum Assessment w Gram Stain, Rflx to Resp Cult     Status: None   Collection Time: 07/27/24  1:37 AM   Specimen: Expectorated Sputum  Result Value Ref Range Status   Specimen Description EXPECTORATED SPUTUM  Final   Special Requests NONE  Final   Sputum evaluation   Final    THIS SPECIMEN IS ACCEPTABLE FOR SPUTUM CULTURE Performed at [facility], [address], [place], [place] [address]    Report Status 07/27/2024 FINAL  Final  MRSA Next Gen by PCR, Nasal     Status: None   Collection Time: 07/27/24  1:37 AM   Specimen: Urine, Clean Catch; Nasal Swab  Result Value Ref Range Status   MRSA by PCR Next Gen NOT DETECTED NOT DETECTED Final    Comment: (NOTE) The GeneXpert MRSA Assay (FDA approved for NASAL specimens only), is one component of a comprehensive MRSA colonization surveillance program. It is not intended to diagnose MRSA infection nor to guide or monitor treatment for MRSA infections. Test performance is not FDA approved in patients less than 43 years old. Performed at [facility], [address] [place], [place] [address]   Culture, Respiratory w Gram Stain     Status: None   Collection Time: 07/27/24  1:37 AM  Result Value Ref Range Status   Specimen Description EXPECTORATED SPUTUM  Final   Special Requests NONE Reflexed from [id number]  Final   Gram Stain   Final    NO WBC SEEN RARE GRAM POSITIVE COCCI RARE GRAM POSITIVE RODS Performed at [facility], [address] [place], [place] [address]    Culture RARE PSEUDOMONAS  AERUGINOSA  Final   Report Status 07/29/2024 FINAL  Final   Organism ID, Bacteria PSEUDOMONAS AERUGINOSA  Final  Susceptibility   Pseudomonas aeruginosa - MIC*    MEROPENEM 1 SENSITIVE Sensitive     CIPROFLOXACIN  0.25 SENSITIVE Sensitive     IMIPENEM 2 SENSITIVE Sensitive     PIP/TAZO Value in next row Sensitive ug/mL     16 SENSITIVEThis is a modified FDA-approved test that has been validated and its performance characteristics determined by the reporting laboratory.  This laboratory is certified under the Clinical Laboratory Improvement Amendments CLIA as qualified to perform high complexity clinical laboratory testing.    CEFEPIME  Value in next row Sensitive      16 SENSITIVEThis is a modified FDA-approved test that has been validated and its performance characteristics determined by the reporting laboratory.  This laboratory is certified under the Clinical Laboratory Improvement Amendments CLIA as qualified to perform high complexity clinical laboratory testing.    CEFTAZIDIME/AVIBACTAM Value in next row Sensitive ug/mL     16 SENSITIVEThis is a modified FDA-approved test that has been validated and its performance characteristics determined by the reporting laboratory.  This laboratory is certified under the Clinical Laboratory Improvement Amendments CLIA as qualified to perform high complexity clinical laboratory testing.    CEFTOLOZANE/TAZOBACTAM Value in next row Sensitive ug/mL     16 SENSITIVEThis is a modified FDA-approved test that has been validated and its performance characteristics determined by the reporting laboratory.  This laboratory is certified under the Clinical Laboratory Improvement Amendments CLIA as qualified to perform high complexity clinical laboratory testing.    TOBRAMYCIN Value in next row Sensitive      16 SENSITIVEThis is a modified FDA-approved test that has been validated and its performance characteristics determined by the reporting laboratory.  This  laboratory is certified under the Clinical Laboratory Improvement Amendments CLIA as qualified to perform high complexity clinical laboratory testing.    CEFTAZIDIME Value in next row Sensitive      16 SENSITIVEThis is a modified FDA-approved test that has been validated and its performance characteristics determined by the reporting laboratory.  This laboratory is certified under the Clinical Laboratory Improvement Amendments CLIA as qualified to perform high complexity clinical laboratory testing.    * RARE PSEUDOMONAS AERUGINOSA  Respiratory (~20 pathogens) panel by PCR     Status: None   Collection Time: 07/28/24  1:51 PM   Specimen: Nasopharyngeal Swab; Respiratory  Result Value Ref Range Status   Adenovirus NOT DETECTED NOT DETECTED Final   Coronavirus 229E NOT DETECTED NOT DETECTED Final    Comment: (NOTE) The Coronavirus on the Respiratory Panel, DOES NOT test for the novel  Coronavirus (2019 nCoV)    Coronavirus HKU1 NOT DETECTED NOT DETECTED Final   Coronavirus NL63 NOT DETECTED NOT DETECTED Final   Coronavirus OC43 NOT DETECTED NOT DETECTED Final   Metapneumovirus NOT DETECTED NOT DETECTED Final   Rhinovirus / Enterovirus NOT DETECTED NOT DETECTED Final   Influenza A NOT DETECTED NOT DETECTED Final   Influenza B NOT DETECTED NOT DETECTED Final   Parainfluenza Virus 1 NOT DETECTED NOT DETECTED Final   Parainfluenza Virus 2 NOT DETECTED NOT DETECTED Final   Parainfluenza Virus 3 NOT DETECTED NOT DETECTED Final   Parainfluenza Virus 4 NOT DETECTED NOT DETECTED Final   Respiratory Syncytial Virus NOT DETECTED NOT DETECTED Final   Bordetella pertussis NOT DETECTED NOT DETECTED Final   Bordetella Parapertussis NOT DETECTED NOT DETECTED Final   Chlamydophila pneumoniae NOT DETECTED NOT DETECTED Final   Mycoplasma pneumoniae NOT DETECTED NOT DETECTED Final    Comment: Performed at [facility],  [address], [place], [place] [address]    Labs: CBC: No results for  input(s): WBC, NEUTROABS, HGB, HCT, MCV, PLT in the last 168 hours.  Basic Metabolic Panel: No results for input(s): NA, K, CL, CO2, GLUCOSE, BUN, CREATININE, CALCIUM , MG, PHOS in the last 168 hours.  Liver Function Tests: No results for input(s): AST, ALT, ALKPHOS, BILITOT, PROT, ALBUMIN  in the last 168 hours.  CBG: No results for input(s): GLUCAP in the last 168 hours.  Discharge time spent: greater than 30 minutes.  Signed: Burnard DELENA Cunning, DO [facility] 08/10/2024

## 2024-07-31 NOTE — Progress Notes (Signed)
 Completed virtual rounds with MD,paramedic at patient bedside. POC reviewed and discussed ,patient voices understanding and agreement. Pt reminded to call RN for any needs, RN and MD available at all times. Pt voices understanding. Pt aware of next planned visit and next call from RN.    Pt's blood pressure elevated, MD addressed on virtual rounds with patient. Pt also agreed with possible discharge tomorrow vs today depending on her blood pressure readings post interventions

## 2024-07-31 NOTE — Progress Notes (Signed)
 At 1730 during Paramedic visit, pt states her right eye has been blurry today. She thought it was because of her blood pressure this am,but has been waxing and waning all day. No other neuro deficits on exam. MD Fausto notified and joined virtual call with RN. Plan for MRI, radiology aware. Plan in place with paramedic for transport

## 2024-07-31 NOTE — Progress Notes (Addendum)
 Progress Note   Patient: Cheyenne Lucero FMW:[id number] DOB: 1960/12/19 DOA: 07/26/2024     5 DOS: the patient was seen and examined on 07/31/2024  Patient identified themself as Cheyenne Lucero  DOB 1961-03-23  Medic Norman Miner present at bedside during encounter and performed the physical exam. Patient was seen today via video chat; my physical location [facility] [place]    Brief hospital course:  Cheyenne Lucero is a 63 y.o. female with hx of COPD, history of previous PE-resolution on VQ scan 07/17/2024, acquired hypothyroidism, essential hypertension, HLD, CKD3a and depression recent admission 8/6-8/10 treated for CAP and COPD exacerbation. Seen by PCP with persistent SOB, chest pain. and noted to have high WBC, low normal BP referred to ED. Concern for ongoing / relapsed CAP. CT Chest with question of LLL / lingular pna v atelectasis and small L pleural effusion, underlying mass not excluded. Treated with Vanc / Cefepime , 1 L IVF.    [facility] for management recurrent episodes of pneumonia.   At present, pt reports clinical improvement in shortness or breath as well as cough though they still persist to a degree. Minmal wheezing. No abdominal pain or diarrhea t present. Pt no longer smokes.     8/21 - After extensive discussion at the bedside, patient is agreeable to transfer to the hospital at home program.  8/22 - sputum culture growing pseudomonas sensitive to cefepime  and rare GPC's without identified organism.  Pt clinically feels improved.  Overnight O2 desats to 87% noted.  8/23 - pt didn't tolerate O2 overnight.  Pt feeling better, mild dyspnea on exertion, overall feeling much improved since admission this time.   Assessment and Plan:  Recurrent/Persistent pneumonia Left Pleural Effusion CT chest showed similar or slightly progressed left pleural effusion and left lower lobe and lingular atelectasis.  Pneumonia  or underlying mass is not excluded. Continued follow-up to resolution recommended Noted recurrence of symptoms after admission for similar issue 8/6-->8/10.  Discharge summary of 8/10 reviewed.  Sputum culture with +Pseudomonas and rare GPC's not yet identified Blood cx negative   8/24 -- continue IV Cefepime  --discharge on PO Cipro  for 10 more days, for total 2 weeks course given pleural effusion and pericarditis, prolonged course of illness --d/c Vancomycin  --will continue IV antibiotics for another 24 hours --Repeat CT chest as outpatient in 2 weeks to evaluate for resolution. --Referral sent for outpatient pulmonology in setting of recurrent pneumonia, pleural effusion, baseline COPD, and need for repeat CT chest    --Supplment O2 if spO2 < 90% on room air --Continue prednisone   --Duonebs PRN --Mucinex  BID PRN  Hypertensive Urgency - 8/24 AM BP's elevated to 180-200 systolic. Essential hypertension - --Pt given extra amlodipine  this AM - for total 10 mg --Adding back losartan  100 mg daily today --Cont amlodipine , metoprolol , losartan  --Pt has close follow up already scheduled with Cardiology  --Titrate regimen  Parapneumonic Pericarditis (presumed) -- diagnosed during initial admission for PNA. Seen by Cardiology, notes reviewed. Pt denies ongoing chest pain, has a 'twinge' here and there nothing like she'd been having at the time. --Continue colchicine   Cardiology recommended long 6 week to 3 month course --Cardiology outpatient follow up   Nocturnal hypoxia - noted first overnight at home with sat 87% on room air.  O2 concentrator brought to the home, pt attempted to use but found the noise bothersome, and nasal cannula irritating. --Recommend outpatient sleep study for ?sleep apnea --If pt remains admitted  tomorrow night, will attempt a nocturnal pulse ox study --Will have pulmonology follow up     COPD - overall stable with minimal wheezing on admission, now  improved --Continue with Breztri  and duonebs prn.  --Continue prednisone  in setting of recurrent pneumonia       Elevated troponin.  Noted 8/19 Trop 20s- appears chronic/baseline  No chest pain.  EKG with no acute ischemic changes.      Hyperlipidemia --Continue lipitor   Generalized anxiety disorder --Continue Wellbutrin    Hypothyroidism -Continue levothyroxine    Peripheral neuropathy -Continue Lyrica    History of pulmonary embolism 2003-not on anticoagulation anymore  - Repeat VQ scan from 07/15/2024 no evidence of pulmonary embolism.  Appears resolved.      Subjective: Pt seen virtually during AM Medic visit today.  She reports a headache and some visual disturbances described as floaters this AM in setting up her BP elevated to as high as 200 systolic.  From pneumonia standpoint, pt reports feeling much better.    Physical Exam: Vitals:   07/30/24 1100 07/30/24 1800 07/30/24 2110 07/31/24 1330  BP: (!) 131/93 (!) 149/111 (!) 165/92 (!) 186/96  Pulse: (!) 58 69 65   Resp: 16 16  17   Temp: (!) 97.5 F (36.4 C)     TempSrc: Oral     SpO2: 95% 96%  97%  Weight: 69.5 kg     Height:       General exam: awake, alert, no acute distress Respiratory system: lungs clear upper b/l with improved aeration, no expiratory wheezes or rhonchi, normal respiratory effort at rest on room air Cardiovascular system: normal S1/S2, RRR, no edema.   Gastrointestinal system: soft, NT, ND Central nervous system: A&O x3. no gross focal neurologic deficits, normal speech Skin: dry, intact, normal temperature & no rashes on visualized skin Psychiatry: normal mood, congruent affect, judgement and insight appear normal   Data Reviewed: No new labs today  Notable prior labs  --   Cr improved 1.26 >> 1.05. Otherwise BMP normal on 8/22  WBC 14.6 >> 17.0 >> 15.8 (on prednisone )  Platelets elevated 481 >> 463 likely reactive due to infection     Disposition: Status is:  Inpatient Remains inpatient appropriate because: BP uncontrolled this AM, pt symptomatic with headache.  BP needs better control prior to discharge.    Planned Discharge Destination: Home    Time spent: 45 minutes  Author: Burnard DELENA Cunning, DO 07/31/2024 4:13 PM  For on call review [email or web address].

## 2024-07-31 NOTE — Progress Notes (Signed)
 I arrived at the patients apartment (D), The purpose of the visit is for the AM encounter.   Patient contact was made at the door and found her, appearing well, in good spirits, expressing no complaints. The patient voices improvement in the acute symptoms.    Physical exam was unrekamabkle for acute or new findings. ABCs are intact. Skin is also intact with brisk cap refill, and no overt signs of hypoxia. Lung sounds are clear all fields. No pain is appreciated  The patient did voice that she did require her morning meds prior to medic arrival, seconady to feelin BP rising. The patient took them with virutal nurse.   Antibiotics were completed, assessment and vitals taken. Virtual appoimtnmet facilitated.

## 2024-08-01 ENCOUNTER — Other Ambulatory Visit (HOSPITAL_COMMUNITY): Payer: Self-pay

## 2024-08-01 DIAGNOSIS — H539 Unspecified visual disturbance: Secondary | ICD-10-CM

## 2024-08-01 LAB — SEDIMENTATION RATE: Sed Rate: 43 mm/h — ABNORMAL HIGH (ref 0–22)

## 2024-08-01 LAB — C-REACTIVE PROTEIN: CRP: 1.3 mg/dL — ABNORMAL HIGH (ref <1.0)

## 2024-08-01 LAB — TSH: TSH: 8.834 u[IU]/mL — ABNORMAL HIGH (ref 0.350–4.500)

## 2024-08-01 LAB — T4, FREE: Free T4: 1.07 ng/dL (ref 0.61–1.12)

## 2024-08-01 MED ORDER — INCRUSE ELLIPTA 62.5 MCG/ACT IN AEPB: 1.0000 | INHALATION_SPRAY | Freq: Every day | RESPIRATORY_TRACT | 1 refills | Status: DC

## 2024-08-01 MED ORDER — BUDESONIDE-FORMOTEROL FUMARATE 80-4.5 MCG/ACT IN AERO
2.0000 | INHALATION_SPRAY | Freq: Two times a day (BID) | RESPIRATORY_TRACT | 12 refills | Status: DC
  Filled 2024-08-01: qty 10.2, 30d supply, fill #0

## 2024-08-01 MED ORDER — BUDESONIDE-FORMOTEROL FUMARATE 80-4.5 MCG/ACT IN AERO: 2.0000 | INHALATION_SPRAY | Freq: Two times a day (BID) | RESPIRATORY_TRACT | 12 refills | Status: DC

## 2024-08-01 MED ORDER — CLONIDINE HCL 0.2 MG PO TABS
0.2000 mg | ORAL_TABLET | ORAL | 0 refills | Status: DC
  Filled 2024-08-01: qty 30, 30d supply, fill #0

## 2024-08-01 MED ORDER — AMLODIPINE BESYLATE 10 MG PO TABS
10.0000 mg | ORAL_TABLET | Freq: Every day | ORAL | Status: DC
  Administered 2024-08-01: 10 mg via ORAL
  Filled 2024-08-01 (×2): qty 1

## 2024-08-01 MED ORDER — HYDRALAZINE HCL 50 MG PO TABS
100.0000 mg | ORAL_TABLET | Freq: Three times a day (TID) | ORAL | Status: DC
  Filled 2024-08-01 (×6): qty 2

## 2024-08-01 MED ORDER — HYDRALAZINE HCL 100 MG PO TABS
100.0000 mg | ORAL_TABLET | Freq: Three times a day (TID) | ORAL | 1 refills | Status: DC
  Filled 2024-08-01: qty 90, 30d supply, fill #0

## 2024-08-01 MED ORDER — DOCUSATE SODIUM 100 MG PO CAPS
100.0000 mg | ORAL_CAPSULE | Freq: Two times a day (BID) | ORAL | Status: DC | PRN
  Filled 2024-08-01 (×3): qty 1

## 2024-08-01 MED ORDER — CLONIDINE HCL 0.2 MG PO TABS
0.2000 mg | ORAL_TABLET | Freq: Every day | ORAL | Status: DC
  Filled 2024-08-01: qty 1

## 2024-08-01 MED ORDER — HYDRALAZINE HCL 50 MG PO TABS
50.0000 mg | ORAL_TABLET | Freq: Three times a day (TID) | ORAL | Status: DC
  Administered 2024-08-01: 50 mg via ORAL
  Filled 2024-08-01 (×2): qty 1

## 2024-08-01 MED ORDER — AMLODIPINE BESYLATE 10 MG PO TABS
10.0000 mg | ORAL_TABLET | Freq: Every day | ORAL | 1 refills | Status: DC
  Filled 2024-08-01: qty 30, 30d supply, fill #0

## 2024-08-01 MED ORDER — HYDRALAZINE HCL 100 MG PO TABS: 100.0000 mg | ORAL_TABLET | Freq: Three times a day (TID) | ORAL | 1 refills | Status: DC

## 2024-08-01 MED ORDER — CLONIDINE HCL 0.2 MG PO TABS: 0.2000 mg | ORAL_TABLET | ORAL | 0 refills | Status: DC

## 2024-08-01 MED ORDER — PREGABALIN 50 MG PO CAPS
100.0000 mg | ORAL_CAPSULE | Freq: Every day | ORAL | 0 refills | Status: DC
  Filled 2024-08-01: qty 30, 15d supply, fill #0

## 2024-08-01 MED ORDER — AMLODIPINE BESYLATE 10 MG PO TABS: 10.0000 mg | ORAL_TABLET | Freq: Every day | ORAL | 1 refills | Status: DC

## 2024-08-01 MED ORDER — INCRUSE ELLIPTA 62.5 MCG/ACT IN AEPB
1.0000 | INHALATION_SPRAY | Freq: Every day | RESPIRATORY_TRACT | 1 refills | Status: DC
  Filled 2024-08-01: qty 30, 30d supply, fill #0

## 2024-08-01 NOTE — Plan of Care (Signed)
 Patient alert & oriented x 4. Calm,cooperative, and appropriate. Provider working on brining elevated BP down. Provider notified. Outpatient follow appointments scheduled. Patient referral placed for ophthalmology f/u.  Problem: Education: Goal: Knowledge of General Education information will improve Description: Including pain rating scale, medication(s)/side effects and non-pharmacologic comfort measures Outcome: Progressing   Problem: Health Behavior/Discharge Planning: Goal: Ability to manage health-related needs will improve Outcome: Progressing   Problem: Clinical Measurements: Goal: Ability to maintain clinical measurements within normal limits will improve Outcome: Progressing Goal: Will remain free from infection Outcome: Progressing Goal: Respiratory complications will improve Outcome: Progressing   Problem: Activity: Goal: Risk for activity intolerance will decrease scheduled Outcome: Progressing   Problem: Coping: Goal: Level of anxiety will decrease Outcome: Progressing   Problem: Safety: Goal: Ability to remain free from injury will improve Outcome: Progressing   Problem: Respiratory: Goal: Ability to maintain a clear airway will improve Outcome: Progressing

## 2024-08-01 NOTE — Progress Notes (Signed)
 0745 phone call to patient inform todays plan. 0800 virtual call after identifiers completed patient self administered levothyroxine . Patient took her BP 179/87, HR 63. Reports right eye blurriness improved 80% compared to yesterday. Provider notified of vitals and right eye improvement. Provider gave verbal order to give mornings losartan  and metoprolol  patient self administered. Educated on BP and eye pressure monitoring instructed patient to signal on current health device if any changes.

## 2024-08-01 NOTE — Plan of Care (Addendum)
 Patient discharging medications provided from [facility] pharmacy. AVS provided and reviewed. Virtual call with provider answer all questions an follow up discussed. Labs will be discussed tomorrow with patient with provider on the phone.  Provider requested patient to continue to monitor BP daily and log for follow up with PCP and ophthalmology will call to make appointment.  Problem: Health Behavior/Discharge Planning: Goal: Ability to manage health-related needs will improve Outcome: Progressing   Problem: Clinical Measurements: Goal: Ability to maintain clinical measurements within normal limits will improve 08/01/2024 1823 by Noah Pao, RN Outcome: Progressing 08/01/2024 1446 by Noah Pao, RN Outcome: Progressing Goal: Diagnostic test results will improve Outcome: Progressing   Problem: Activity: Goal: Risk for activity intolerance will decrease 08/01/2024 1823 by Noah Pao, RN Outcome: Progressing 08/01/2024 1446 by Noah Pao, RN Outcome: Progressing   Problem: Coping: Goal: Level of anxiety will decrease Outcome: Progressing   Problem: Safety: Goal: Ability to remain free from injury will improve 08/01/2024 1823 by Noah Pao, RN Outcome: Progressing 08/01/2024 1446 by Noah Pao, RN Outcome: Progressing   Problem: Activity: Goal: Ability to tolerate increased activity will improve Outcome: Progressing   Problem: Respiratory: Goal: Ability to maintain a clear airway will improve Outcome: Progressing

## 2024-08-01 NOTE — Progress Notes (Addendum)
 Hospital at Home - Progress Update  Patient identified themself as Cheyenne Lucero  DOB May 01, 1961  Medic Norman Miner present at bedside during encounter and performed the physical exam. Patient was seen today via video chat; my physical location [place]    Notified by [facility] team during evening Medic visit that patient has had visual disturbances most of today.  Given her elevated BP's this morning, concern for PRES syndrome.    Pt seen virtually with Medic present in the home. Pt reports vision is diminished, foggy appearing with the right eye more significant and describes as looking through a thick lens.  Pt denying headache and any other neurologic changes including numbness, tingling, weakness, severe dizziness, and reports ambulating well, no deficits in equilibrium.    BP remains elevated this evening but improved since this AM.  BP at 1330 was 186/96.  Current BP is 170/80. Pt given 10 mg amlodipine  this AM (usual dose is 2.5 mg), and losartan  100 mg was given this afternoon at 1415.  Pt reports recent changes underway in her BP regimen, losartan /hydrochlorothiazide had been held due to renal function (now recovered). And clonidine  is actively being weaned off.  Discussed below plan with patient and she is agreeable for obtaining MRI.   PLAN --  --Transport pt to the hospital for MRI brain to assess for PRES or CVA. --Medic to keep pt at the hospital until results are available, in case of need to transfer to inpatient unit based on MRI findings. --Micheline covering NP notified by staff message of above. --Follow MRI results. --PRN Hydralazine  added    ADDENDUM -- MRI brain normal.  Team notified of results and pt transported back to her home.   CRITICAL CARE Performed by: Burnard DELENA Cunning   Total critical care time: 45 minutes   Critical care time was exclusive of separately billable procedures and treating other patients.   Critical care was necessary to treat or  prevent imminent or life-threatening deterioration. Vision changes associated with accelerated hypertension.   Critical care was time spent personally by me on the following activities: development of treatment plan with patient and/or surrogate as well as nursing, discussions with consultants, evaluation of patient's response to treatment, examination of patient, obtaining history from patient or surrogate, ordering and performing treatments and interventions, ordering and review of laboratory studies, ordering and review of radiographic studies, pulse oximetry and re-evaluation of patient's condition.

## 2024-08-01 NOTE — Progress Notes (Addendum)
 1414 virtual with patient after identification complete took BP: 169/91, HR 68 reports feeling same as earlier right eye has not worsened but feels very fatigued, denies any difficulty with breathing, chest pressure/pain, dizziness, or palpitations. Provider had patient self administer hydralazine  50 mg PO virtually with RN and to recheck BP in a couple of hours. Discharge still pending.

## 2024-08-01 NOTE — Progress Notes (Signed)
 1032 virtual visit Provider, Chief Financial Officer. Patients BP 158/96 HR 50. Patient and medic report no additional symptoms. Patient now taking amlodipine  10mg . Provider discussed cardiology, ophthalmology follow up on discharge.

## 2024-08-01 NOTE — Progress Notes (Signed)
 Arrived to the patients home for the PM visit and D/C.   Found the patient in her residence, unchanged from the morning visit. ABCs are intact. No signs of acute changes or complaints.   Physical assessment is WDL.   ABX were administered, labs drawn, virtual visit facilitated.The patient was educated by the nurse on the discharge orders.

## 2024-08-02 ENCOUNTER — Other Ambulatory Visit (HOSPITAL_COMMUNITY): Payer: Self-pay

## 2024-08-03 ENCOUNTER — Ambulatory Visit (HOSPITAL_COMMUNITY)
Admission: RE | Admit: 2024-08-03 | Discharge: 2024-08-03 | Disposition: A | Discharge status: Home / Self Care | Source: Ambulatory Visit | Attending: Cardiology | Admitting: Cardiology

## 2024-08-03 ENCOUNTER — Ambulatory Visit: Payer: Self-pay | Admitting: Cardiovascular Disease

## 2024-08-03 DIAGNOSIS — I3139 Other pericardial effusion (noninflammatory): Secondary | ICD-10-CM | POA: Diagnosis present

## 2024-08-03 LAB — ECHOCARDIOGRAM LIMITED
Area-P 1/2: 2.89 cm2
S' Lateral: 2.1 cm

## 2024-08-05 LAB — T3, FREE: T3, Free: 2.2 pg/mL (ref 2.0–4.4)

## 2024-08-07 NOTE — Progress Notes (Unsigned)
 Cardiology Office Note   Date:  08/09/2024  ID:  Cheyenne Lucero, DOB 11-30-1961, MRN [id number] PCP: Burney Darice CROME, MD   HeartCare Providers Cardiologist:  Jerel Balding, MD   History of Present Illness Cheyenne Lucero is a 63 y.o. 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 Von.  She presented to [facility] on 07/13/2024 for chest pain and shortness of breath which she reported for 4 days with nonproductive cough.  She reported associated intermittent sharp left lateral chest wall discomfort with deep inspiration.  Denies any exertional elements of her symptoms.  Denies orthopnea, lower extremity edema, PND, palpitations, diaphoresis, dizziness, and presyncope.  Relevant workup in the ED since admission includes creatinine 1.39> 2.08> 1.35> 1.4, CBC showing leukocytosis with WBC 14.8, respiratory panel negative, Pro-Cal negative, troponin 34> 29.  CXR showed infection versus atelectasis, possible small amount of left pleural fluid.  CT of the chest showed small pericardial effusion, LLL atelectasis with superimposed infection.  VQ scan pending.  Echo showed LVEF 66 5%, G1 DD, normal RV function, small pericardial effusion containing fibrinous material.  Recommendations for follow-up cardiac MRI were made.  Diagnosis severe sepsis and acute hypoxic respiratory failure due to PNA.  Underwent echocardiogram resulting as above in cardiology was asked to consult for further recommendations.  Her history includes hypertension that she reported always having struggled to control.  Home regimen is clonidine  0.2 mg daily, Lopressor  25 mg twice daily, losartan  50 mg daily.  Main complaint at the time of consultation on 07/15/2024 was pleuritic chest pain.   She was doing well with her incentive spirometer.  She was on 2 L nasal cannula of oxygen but does not require this at home.  Today, she presents for follow-up after hospitalization for pleuritic chest pain and pneumonia.  She was hospitalized on August 8th for pleuritic chest pain and returned a week later due to worsening symptoms. During her stay, she was treated for pneumonia and severe sepsis. She continues to experience chest pain and congestion. Her troponin levels were elevated during hospitalization.  She has a history of pulmonary embolism 22 years ago after a long car ride and smoking, which she quit six years ago. Her current medications include losartan , clonidine , and amlodipine  for blood pressure management. Amlodipine  causes significant drops in blood pressure, leading to lightheadedness. Her recent blood pressure reading was 94/62. She is also on Lipitor and Zetia  for high cholesterol, with an LDL of 149 and triglycerides of 242. She has not yet increased her Lipitor dose due to recent initiation. No ankle swelling is noted.  Reports no shortness of breath nor dyspnea on exertion. Reports no chest pain, pressure, or tightness. No edema, orthopnea, PND. Reports no palpitations.   Discussed the use of AI scribe software for clinical note transcription with the patient, who gave verbal consent to proceed.   ROS: pertinent ROS in HPI  Studies Reviewed     Echo 08/03/24 IMPRESSIONS     1. Limited study to R/O pericardial effusion; trivial effusion noted in  subcostal views.   2. Left ventricular ejection fraction, by estimation, is 60 to 65%. The  left ventricle has normal function. The left ventricle has no regional  wall motion abnormalities. Left ventricular diastolic parameters are  consistent with Grade I  diastolic  dysfunction (impaired relaxation).   3. Right ventricular systolic function is normal. The right ventricular  size is normal.   4. The mitral valve is normal  in structure. Trivial mitral valve  regurgitation.   5. The aortic valve is tricuspid. Aortic valve regurgitation is not  visualized. No aortic stenosis is present.   6. The inferior vena cava is normal in size with greater than 50%  respiratory variability, suggesting right atrial pressure of 3 mmHg.   FINDINGS   Left Ventricle: Left ventricular ejection fraction, by estimation, is 60  to 65%. The left ventricle has normal function. The left ventricle has no  regional wall motion abnormalities. The left ventricular internal cavity  size was normal in size. There is   no left ventricular hypertrophy. Left ventricular diastolic parameters  are consistent with Grade I diastolic dysfunction (impaired relaxation).   Right Ventricle: The right ventricular size is normal. Right ventricular  systolic function is normal.   Left Atrium: Left atrial size was normal in size.   Right Atrium: Right atrial size was normal in size.   Pericardium: Trivial pericardial effusion is present.   Mitral Valve: The mitral valve is normal in structure. Trivial mitral  valve regurgitation.   Tricuspid Valve: The tricuspid valve is normal in structure. Tricuspid  valve regurgitation is trivial. No evidence of tricuspid stenosis.   Aortic Valve: The aortic valve is tricuspid. Aortic valve regurgitation is  not visualized. No aortic stenosis is present.   Pulmonic Valve: The pulmonic valve was grossly normal.   Aorta: The aortic root is normal in size and structure.   Venous: The inferior vena cava is normal in size with greater than 50%  respiratory variability, suggesting right atrial pressure of 3 mmHg.   Additional Comments: Limited study to R/O pericardial effusion; trivial  effusion noted in subcostal views.           Physical Exam VS:  BP 94/62 (BP Location: Left Arm, Patient Position: Sitting, Cuff Size: Normal)   Pulse 88   Ht 5' 4 (1.626 m)   Wt 151 lb (68.5 kg)   SpO2 90%   BMI 25.92  kg/m        Wt Readings from Last 3 Encounters:  08/09/24 151 lb (68.5 kg)  07/30/24 153 lb 3.2 oz (69.5 kg)  07/17/24 152 lb 14.4 oz (69.4 kg)    GEN: Well nourished, well developed in no acute distress NECK: No JVD; No carotid bruits CARDIAC: RRR, no murmurs, rubs, gallops RESPIRATORY:  Clear to auscultation without rales, wheezing or rhonchi  ABDOMEN: Soft, non-tender, non-distended EXTREMITIES:  No edema; No deformity   ASSESSMENT AND PLAN  Pneumonia, recently hospitalized, on antibiotics Recent hospitalization for pneumonia with severe sepsis. Congestion persists, completing antibiotics. Low blood pressure likely due to infection. - Complete current course of antibiotics. - Monitor blood pressure regularly.  Pleuritic chest pain Intermittent chest pain persists, previously linked to resolved pericardial effusion.  Pericardial effusion, resolved Pericardial effusion resolved as confirmed by echocardiogram. - Continue colchicine  for total of 1 month (stop date September 7)  Essential hypertension, difficult to control Hypertension difficult to control, current low blood pressure. Amlodipine  likely causing hypotension. Clonidine  necessary for management. - Reduce amlodipine  to 5 mg. - Monitor blood pressure at home. - Send blood pressure readings via MyChart for further dose adjustments.  Hyperlipidemia LDL elevated at 149 mg/dL, triglycerides at 757 mg/dL. On atorvastatin  and ezetimibe . Repatha effective but not covered by insurance. - Recheck lipid  panel in 8 weeks. - Consult with clinical pharmacist for potential resources for Repatha.   Dispo: She can see MD in 3 months  Signed, Orren LOISE Fabry, PA-C

## 2024-08-09 ENCOUNTER — Encounter: Payer: Self-pay | Admitting: Physician Assistant

## 2024-08-09 ENCOUNTER — Other Ambulatory Visit (HOSPITAL_COMMUNITY): Payer: Self-pay

## 2024-08-09 ENCOUNTER — Ambulatory Visit: Attending: Physician Assistant | Admitting: Physician Assistant

## 2024-08-09 VITALS — BP 94/62 | HR 88 | Ht 64.0 in | Wt 151.0 lb

## 2024-08-09 DIAGNOSIS — R6520 Severe sepsis without septic shock: Secondary | ICD-10-CM | POA: Diagnosis present

## 2024-08-09 DIAGNOSIS — R0781 Pleurodynia: Secondary | ICD-10-CM | POA: Insufficient documentation

## 2024-08-09 DIAGNOSIS — E785 Hyperlipidemia, unspecified: Secondary | ICD-10-CM | POA: Insufficient documentation

## 2024-08-09 DIAGNOSIS — I3139 Other pericardial effusion (noninflammatory): Secondary | ICD-10-CM | POA: Diagnosis present

## 2024-08-09 DIAGNOSIS — I10 Essential (primary) hypertension: Secondary | ICD-10-CM | POA: Diagnosis present

## 2024-08-09 DIAGNOSIS — A419 Sepsis, unspecified organism: Secondary | ICD-10-CM | POA: Insufficient documentation

## 2024-08-09 MED ORDER — AMLODIPINE BESYLATE 5 MG PO TABS: 5.0000 mg | ORAL_TABLET | Freq: Every day | ORAL | 3 refills | Status: DC

## 2024-08-09 NOTE — Patient Instructions (Addendum)
 Medication Instructions:  Your physician has recommended you make the following change in your medication:  DECREASE AMLODIPINE  TO 5 MG DAILY   *If you need a refill on your cardiac medications before your next appointment, please call your pharmacy*  Lab Work: TO BE DONE IN 4 WEEK: LIPIDS  If you have labs (blood work) drawn today and your tests are completely normal, you will receive your results only by: MyChart Message (if you have MyChart) OR A paper copy in the mail If you have any lab test that is abnormal or we need to change your treatment, we will call you to review the results.  Testing/Procedures: NONE  Follow-Up: At [facility], you and your health needs are our priority.  As part of our continuing mission to provide you with exceptional heart care, our providers are all part of one team.  This team includes your primary Cardiologist (physician) and Advanced Practice Providers or APPs (Physician Assistants and Nurse Practitioners) who all work together to provide you with the care you need, when you need it.  Your next appointment:   3 month(s)  Provider:   Jerel Balding, MD   We recommend signing up for the patient portal called MyChart.  Sign up information is provided on this After Visit Summary.  MyChart is used to connect with patients for Virtual Visits (Telemedicine).  Patients are able to view lab/test results, encounter notes, upcoming appointments, etc.  Non-urgent messages can be sent to your provider as well.   To learn more about what you can do with MyChart, go to [email or web address].   Other Instructions Please check your blood pressure 1-2 times per day for 2 weeks and bring readings to next appointment

## 2024-08-12 ENCOUNTER — Telehealth: Payer: Self-pay | Admitting: Pharmacist

## 2024-08-12 NOTE — Telephone Encounter (Signed)
-----   Message from Cheyenne Lucero sent at 08/09/2024  5:21 PM EDT ----- Question: she was on Repatha previously and had great LDL reduction however insurance stopped covering.  Do we have any resources we could offer her to cover this medication? Thank you!!!

## 2024-08-14 ENCOUNTER — Encounter: Payer: Self-pay | Admitting: Physician Assistant

## 2024-08-15 ENCOUNTER — Telehealth: Payer: Self-pay | Admitting: Pharmacy Technician

## 2024-08-15 ENCOUNTER — Other Ambulatory Visit (HOSPITAL_COMMUNITY): Payer: Self-pay

## 2024-08-15 MED ORDER — REPATHA SURECLICK 140 MG/ML ~~LOC~~ SOAJ: 140.0000 mg | SUBCUTANEOUS | 3 refills | Status: DC

## 2024-08-15 NOTE — Telephone Encounter (Signed)
 Pharmacy Patient Advocate Encounter  Received notification from [employer] that Prior Authorization for Repatha  has been APPROVED from 08/15/24 to 08/15/25. Ran test claim, Copay is $4.00- one month. This test claim was processed through [facility]- copay amounts may vary at other pharmacies due to pharmacy/plan contracts, or as the patient moves through the different stages of their insurance plan.   PA #/Case ID/Reference #: [id number]

## 2024-08-15 NOTE — Telephone Encounter (Signed)
 Clonidine  and losartan  taken off of med list

## 2024-08-15 NOTE — Telephone Encounter (Signed)
 Patient made aware of Repatha  PA. Patient was appreciative. Prescription sent to preferred pharmacy.

## 2024-08-15 NOTE — Addendum Note (Signed)
 Addended by: Estephania Licciardi K on: 08/15/2024 04:14 PM   Modules accepted: Orders

## 2024-08-15 NOTE — Telephone Encounter (Signed)
 Pharmacy Patient Advocate Encounter   Received notification from Pt Calls Messages that prior authorization for repatha  is required/requested.   Insurance verification completed.   The patient is insured through [employer] .   Per test claim: PA required; PA submitted to above mentioned insurance via Latent Key/confirmation #/EOC [facility] Status is pending

## 2024-08-17 ENCOUNTER — Other Ambulatory Visit (HOSPITAL_COMMUNITY): Payer: Self-pay

## 2024-08-17 MED ORDER — ATORVASTATIN CALCIUM 20 MG PO TABS
20.0000 mg | ORAL_TABLET | Freq: Every day | ORAL | 3 refills | Status: DC
  Filled 2024-08-17 (×3): qty 90, 90d supply, fill #0

## 2024-08-19 ENCOUNTER — Ambulatory Visit: Admitting: Cardiology

## 2024-08-26 ENCOUNTER — Institutional Professional Consult (permissible substitution) (HOSPITAL_BASED_OUTPATIENT_CLINIC_OR_DEPARTMENT_OTHER): Admitting: Internal Medicine

## 2024-08-29 ENCOUNTER — Ambulatory Visit

## 2024-08-29 VITALS — BP 134/86 | HR 85 | Ht 64.0 in | Wt 151.4 lb

## 2024-08-29 DIAGNOSIS — J432 Centrilobular emphysema: Secondary | ICD-10-CM | POA: Diagnosis not present

## 2024-08-29 DIAGNOSIS — J151 Pneumonia due to Pseudomonas: Secondary | ICD-10-CM | POA: Diagnosis not present

## 2024-08-29 DIAGNOSIS — Z87891 Personal history of nicotine dependence: Secondary | ICD-10-CM | POA: Diagnosis not present

## 2024-08-29 DIAGNOSIS — R0683 Snoring: Secondary | ICD-10-CM

## 2024-08-29 MED ORDER — UMECLIDINIUM-VILANTEROL 62.5-25 MCG/ACT IN AEPB: 1.0000 | INHALATION_SPRAY | Freq: Every day | RESPIRATORY_TRACT | 5 refills | Status: AC

## 2024-08-29 NOTE — Patient Instructions (Signed)
 Notification of test results are managed in the following manner: If there are any recommendations or changes to the plan of care discussed in office today, we will contact you and let you know what they are. If you do not hear from us , then your results are normal/expected and you can view them through your MyChart account, or a letter will be sent to you. Thank you again for trusting us  with your care  Pulmonary.  Stop symbicort  and incruse.  Start anoro ellipta  and cont to take as needed albuterol .  Lung function test.   CT scan has been ordered.

## 2024-08-29 NOTE — Progress Notes (Signed)
 New Patient Pulmonology Office Visit   Subjective:  Patient ID: Cheyenne Lucero, female    DOB: 02-25-1961  MRN: [id number]  Referred by: Fausto Burnard LABOR, DO  CC:  Chief Complaint  Patient presents with   Consult    Had pneumonia - started late July and hospitalized twice in August. Pt states she still a dry cough.  Pt states she had a HST 3 years ago, states she does not and will not use a CPAP or BiPAP - that results indicated she did not need one.      HPI Cheyenne Lucero is a 63 y.o. female with hypertension, COPD, hyperthyroidism now with acquired hypothyroidism, PE. Admitted in August 2025. Was admitted twice in Aug for same sided PNA: CT chest with left lower lobe and lingular opacity and was treated for pneumonia.  Sputum culture positive for Pseudomonas.  During the hospital [place] he was changed to Incruse and Symbicort  because of cost. Never had PNA before.   Now feeling better. Still has some lingering cough, no phlegm. No CP.  Markedly feels better compared to before.   Currently on symbicort  and incruse.  Perfumes and strong smells triggers breathing. Has seasonal allergies. No hx of asthma or Fam hx. No full PFT in the past.   Snores, no apnea/gasping arousal/tiredness at baseline. No nocturnal awakening. No dry mouth or morning HA. Per patient had HST which is neg.    Lung Health: Functional status: can walk at her pace for 15 min. Prior to PNA she had not issues with PNA.  Covid vaccine: 2 Influenza vaccine: yes Pneumonococcal vaccine: Follow w PCP.  Has had RSV in 2025 Smoking: quit in 2019. 1 pk x 35 years.  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 Cats at home x 3.   Test: Echo August 2025 EF 60-65%, grade 1 DD normal RV function no valvular abnormalities.  CT chest August 2025: Reviewed by me: Underlying emphysematous changes, left lower lobe and left upper lobe infiltrate with trace left lower  lobe effusion.  Minimal mediastinal lymphadenopathy.  Eosinophil count less than 300     08/29/2024    1:00 PM  Results of the Epworth flowsheet  Sitting and reading 0  Watching TV 0  Sitting, inactive in a public place (e.g. a theatre or a meeting) 0  As a passenger in a car for an hour without a break 0  Lying down to rest in the afternoon when circumstances permit 0  Sitting and talking to someone 0  Sitting quietly after a lunch without alcohol 0  In a car, while stopped for a few minutes in traffic 0  Total score 0    Allergies: Morphine and codeine and Ultram  [tramadol ]  History: Past Medical History:  Diagnosis Date   COPD (chronic obstructive pulmonary disease) (HCC)    Coronary artery disease 2004   heart event from a blood clot    Essential hypertension    Hypothyroidism (acquired)    Pulmonary embolus (HCC)    in Nov 2003 (after long-distance travel)   Past Surgical History:  Procedure Laterality Date   LEFT HEART CATH     Family History  Problem Relation Age of Onset   Lupus Mother    Social History   Socioeconomic History   Marital status: Media planner    Spouse name: Not on file   Number of children: Not on file   Years of education: Not on file  Highest education level: Not on file  Occupational History   Not on file  Tobacco Use   Smoking status: Former    Current packs/day: 0.00    Types: Cigarettes    Quit date: 08/08/2018    Years since quitting: 6.0   Smokeless tobacco: Never   Tobacco comments:    Smoked for 35 years, about 1ppd  Vaping Use   Vaping status: Never Used  Substance and Sexual Activity   Alcohol use: No   Drug use: No   Sexual activity: Not on file  Other Topics Concern   Not on file  Social History Narrative   Not on file   Social Drivers of Health   Financial Resource Strain: Low Risk  (06/15/2024)   [facility]   Overall Financial Resource Strain (CARDIA)    Difficulty of Paying Living  Expenses: Not hard at all  Food Insecurity: No Food Insecurity (07/27/2024)   Hunger Vital Sign    Worried About Running Out of Food in the Last Year: Never true    Ran Out of Food in the Last Year: Never true  Transportation Needs: No Transportation Needs (07/27/2024)   PRAPARE - Administrator, Civil Service (Medical): No    Lack of Transportation (Non-Medical): No  Physical Activity: Not on file  Stress: Not on file  Social Connections: Not on file  Intimate Partner Violence: Not At Risk (07/27/2024)   Humiliation, Afraid, Rape, and Kick questionnaire    Fear of Current or Ex-Partner: No    Emotionally Abused: No    Physically Abused: No    Sexually Abused: No    Immunization status: Immunization History  Administered Date(s) Administered   PFIZER(Purple Top)SARS-COV-2 Vaccination 03/09/2020, 04/02/2020    Current Meds:  Current Outpatient Medications:    acetaminophen  (TYLENOL ) 500 MG tablet, Take 500-1,000 mg by mouth 2 (two) times daily as needed for moderate pain (pain score 4-6), fever or headache., Disp: , Rfl:    albuterol  (VENTOLIN  HFA) 108 (90 Base) MCG/ACT inhaler, Inhale 2 puffs into the lungs every 6 (six) hours as needed for wheezing or shortness of breath., Disp: , Rfl:    amLODipine  (NORVASC ) 5 MG tablet, Take 1 tablet (5 mg total) by mouth daily. (Patient taking differently: Take 10 mg by mouth daily.), Disp: 180 tablet, Rfl: 3   aspirin  EC 81 MG tablet, Take 81 mg by mouth daily., Disp: , Rfl:    atorvastatin  (LIPITOR) 20 MG tablet, Take 1 tablet (20 mg total) by mouth daily., Disp: 90 tablet, Rfl: 3   buPROPion  (WELLBUTRIN  XL) 150 MG 24 hr tablet, Take 150 mg by mouth daily., Disp: , Rfl:    Cholecalciferol  (VITAMIN D3) 125 MCG (5000 UT) CAPS, Take 5,000 Units by mouth daily., Disp: , Rfl:    cyanocobalamin  1000 MCG tablet, Take 1 tablet (1,000 mcg total) by mouth daily., Disp: 90 tablet, Rfl: 1   Evolocumab  (REPATHA  SURECLICK) 140 MG/ML SOAJ, Inject  140 mg into the skin every 14 (fourteen) days., Disp: 6 mL, Rfl: 3   guaiFENesin -dextromethorphan (ROBITUSSIN DM) 100-10 MG/5ML syrup, Take 10 mLs by mouth every 6 (six) hours as needed for cough., Disp: 118 mL, Rfl: 0   levothyroxine  (SYNTHROID ) 100 MCG tablet, Take 100 mcg by mouth daily before breakfast., Disp: , Rfl:    metoprolol  tartrate (LOPRESSOR ) 50 MG tablet, Take 25 mg by mouth 2 (two) times daily., Disp: , Rfl:    mirtazapine  (REMERON ) 30 MG tablet, Take 30  mg by mouth at bedtime., Disp: , Rfl:    pregabalin  (LYRICA ) 50 MG capsule, Take 2 capsules (100 mg total) by mouth at bedtime., Disp: 30 capsule, Rfl: 0   umeclidinium-vilanterol (ANORO ELLIPTA ) 62.5-25 MCG/ACT AEPB, Inhale 1 puff into the lungs daily., Disp: 1 each, Rfl: 5      Objective:  BP 134/86   Pulse 85   Ht 5' 4 (1.626 m)   Wt 151 lb 6.4 oz (68.7 kg)   SpO2 95% Comment: RA  BMI 25.99 kg/m  BMI Readings from Last 3 Encounters:  08/29/24 25.99 kg/m  08/09/24 25.92 kg/m  07/30/24 26.30 kg/m    General: not in distress patient appearing healthy Lungs: clear to auscultation bilaterally.  Heart: regular rate rhythm, no murmur appreciated.  Abdomen: non tender, non distended. Normal BS.  Neuro: axox3.  Moves all extremities.    Diagnostic Review:  Last metabolic panel Lab Results  Component Value Date   GLUCOSE 93 07/29/2024   NA 142 07/29/2024   K 3.9 07/29/2024   CL 107 07/29/2024   CO2 23 07/29/2024   BUN 18 07/29/2024   CREATININE 1.05 (H) 07/29/2024   GFRNONAA 60 (L) 07/29/2024   CALCIUM  9.4 07/29/2024   PHOS 5.0 (H) 07/17/2024   PROT 6.6 07/27/2024   ALBUMIN  2.7 (L) 07/27/2024   BILITOT 1.1 07/27/2024   ALKPHOS 87 07/27/2024   AST 21 07/27/2024   ALT 24 07/27/2024   ANIONGAP 12 07/29/2024       Assessment & Plan:   Assessment & Plan Centrilobular emphysema (HCC) May have a component of underlying asthma though eosinophil counts have been negative.  With patient having recent  pneumonia will DC inhaled steroid. Stop symbicort  and incruse.  Start anoro ellipta  and cont to take as needed albuterol .  Lung function test.  Pneumonia of left lower lobe due to Pseudomonas species [facility]) CT form August reviewed by me.   Repeat CT scan to evaluate her left lung opacity. Snoring Low suspicion for OSA with recent negative home sleep study. Will try to obtain home sleep study report from PCP office.  Orders Placed This Encounter  Procedures   CT Chest Wo Contrast   Pulmonary function test      Return for Sch after CT scan, sch after PFTs.   Deontrey Massi, MD

## 2024-09-02 ENCOUNTER — Ambulatory Visit
Admission: RE | Admit: 2024-09-02 | Discharge: 2024-09-02 | Disposition: A | Discharge status: Home / Self Care | Source: Ambulatory Visit

## 2024-09-02 DIAGNOSIS — J151 Pneumonia due to Pseudomonas: Secondary | ICD-10-CM

## 2024-09-08 ENCOUNTER — Ambulatory Visit: Payer: Self-pay

## 2024-09-21 ENCOUNTER — Ambulatory Visit (INDEPENDENT_AMBULATORY_CARE_PROVIDER_SITE_OTHER)

## 2024-09-21 DIAGNOSIS — J432 Centrilobular emphysema: Secondary | ICD-10-CM

## 2024-09-21 LAB — PULMONARY FUNCTION TEST
DL/VA % pred: 72 %
DL/VA: 2.99 ml/min/mmHg/L
DLCO cor % pred: 56 %
DLCO cor: 11.66 ml/min/mmHg
DLCO unc % pred: 55 %
DLCO unc: 11.47 ml/min/mmHg
FEF 25-75 Post: 1.68 L/s
FEF 25-75 Pre: 1.31 L/s
FEF2575-%Change-Post: 27 %
FEF2575-%Pred-Post: 75 %
FEF2575-%Pred-Pre: 58 %
FEV1-%Change-Post: 4 %
FEV1-%Pred-Post: 80 %
FEV1-%Pred-Pre: 76 %
FEV1-Post: 2.04 L
FEV1-Pre: 1.96 L
FEV1FVC-%Change-Post: 0 %
FEV1FVC-%Pred-Pre: 93 %
FEV6-%Change-Post: 5 %
FEV6-%Pred-Post: 88 %
FEV6-%Pred-Pre: 83 %
FEV6-Post: 2.81 L
FEV6-Pre: 2.68 L
FEV6FVC-%Change-Post: 0 %
FEV6FVC-%Pred-Post: 102 %
FEV6FVC-%Pred-Pre: 102 %
FVC-%Change-Post: 5 %
FVC-%Pred-Post: 85 %
FVC-%Pred-Pre: 81 %
FVC-Post: 2.85 L
FVC-Pre: 2.71 L
Post FEV1/FVC ratio: 72 %
Post FEV6/FVC ratio: 99 %
Pre FEV1/FVC ratio: 72 %
Pre FEV6/FVC Ratio: 99 %
RV % pred: 88 %
RV: 1.87 L
TLC % pred: 90 %
TLC: 4.7 L

## 2024-09-21 NOTE — Patient Instructions (Signed)
 Full pft performed today

## 2024-09-21 NOTE — Progress Notes (Signed)
 Full pft performed today

## 2024-09-22 ENCOUNTER — Ambulatory Visit (INDEPENDENT_AMBULATORY_CARE_PROVIDER_SITE_OTHER)

## 2024-09-22 VITALS — BP 122/74 | HR 61 | Ht 65.0 in | Wt 155.0 lb

## 2024-09-22 DIAGNOSIS — J432 Centrilobular emphysema: Secondary | ICD-10-CM | POA: Insufficient documentation

## 2024-09-22 DIAGNOSIS — R942 Abnormal results of pulmonary function studies: Secondary | ICD-10-CM | POA: Diagnosis not present

## 2024-09-22 DIAGNOSIS — G4733 Obstructive sleep apnea (adult) (pediatric): Secondary | ICD-10-CM | POA: Diagnosis not present

## 2024-09-22 DIAGNOSIS — G4734 Idiopathic sleep related nonobstructive alveolar hypoventilation: Secondary | ICD-10-CM

## 2024-09-22 DIAGNOSIS — J439 Emphysema, unspecified: Secondary | ICD-10-CM

## 2024-09-22 DIAGNOSIS — J151 Pneumonia due to Pseudomonas: Secondary | ICD-10-CM | POA: Diagnosis not present

## 2024-09-22 DIAGNOSIS — J8410 Pulmonary fibrosis, unspecified: Secondary | ICD-10-CM

## 2024-09-22 NOTE — Progress Notes (Signed)
 New Patient Pulmonology Office Visit   Subjective:  Patient ID: Cheyenne Lucero, female    DOB: 02-16-1961  MRN: [id number]  Referred by: Theodoro Lakes, MD  CC:  Chief Complaint  Patient presents with   Medical Management of Chronic Issues    PT states no concerns     HPI Cheyenne Lucero is a 63 y.o. female with hypertension, COPD, acquired hypothyroidism, PE. Admitted in August 2025. Was admitted twice in Aug for same sided PNA: CT chest with left lower lobe and lingular opacity and was treated for pneumonia.  Sputum culture positive for Pseudomonas.  During the hospital she was changed to Incruse and Symbicort  because of cost. Never had PNA before.   08/29/2024: Saw posthospitalization: Improved cough, and felt close to baseline. Her Symbicort  and Incruse> Anoro Ellipta  only due to recent pneumonia.  There could be a component of asthma.  Low suspicion for OSA with recent HST.  Working on getting the sleep study report. ET: can walk at her pace for 15 min. Prior to PNA she had not issues with PNA.   09/22/24: cough is better and almost resolved. likes anoro ellipta . Using albuterol  twice/week. ET is improving. Coming back to baseline.   Lung Health: Functional status: can walk at her pace for 15 min. Prior to PNA she had not issues with PNA.  Covid vaccine: 2 Influenza vaccine: yes Pneumonococcal vaccine: Sept 2025.  Has had RSV in 2025 Smoking: quit in 2019. 1 pk x 35 years.  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 Cats at home x 3.   Test: Echo August 2025 EF 60-65%, grade 1 DD normal RV function no valvular abnormalities.  CT chest August 2025: Reviewed by me: Underlying emphysematous changes, left lower lobe and left upper lobe infiltrate with trace left lower lobe effusion.  Minimal mediastinal lymphadenopathy.  CT chest October 2025: Underlying severe emphysematous changes with marked improvement in left lower  lobe and left upper lobe infiltrate with trace effusion on the left.  There is some scarring on the left.  PFT October 2025: Normal spirometry [borderline normal], moderately reduced DLCO.  Eosinophil count less than 300  HST 10/2019: RDI 7.3, oxygen desaturations 506 minutes 180 pound.     08/29/2024    1:00 PM  Results of the Epworth flowsheet  Sitting and reading 0  Watching TV 0  Sitting, inactive in a public place (e.g. a theatre or a meeting) 0  As a passenger in a car for an hour without a break 0  Lying down to rest in the afternoon when circumstances permit 0  Sitting and talking to someone 0  Sitting quietly after a lunch without alcohol 0  In a car, while stopped for a few minutes in traffic 0  Total score 0    Allergies: Morphine and codeine and Ultram  [tramadol ]  History: Past Medical History:  Diagnosis Date   COPD (chronic obstructive pulmonary disease) (HCC)    Coronary artery disease 2004   heart event from a blood clot    Essential hypertension    Hypothyroidism (acquired)    Pulmonary embolus (HCC)    in Nov 2003 (after long-distance travel)   Past Surgical History:  Procedure Laterality Date   LEFT HEART CATH     Family History  Problem Relation Age of Onset   Lupus Mother    Social History   Socioeconomic History   Marital status: Media planner    Spouse  name: Not on file   Number of children: Not on file   Years of education: Not on file   Highest education level: Not on file  Occupational History   Not on file  Tobacco Use   Smoking status: Former    Current packs/day: 0.00    Types: Cigarettes    Quit date: 08/08/2018    Years since quitting: 6.1   Smokeless tobacco: Never   Tobacco comments:    Smoked for 35 years, about 1ppd  Vaping Use   Vaping status: Never Used  Substance and Sexual Activity   Alcohol use: No   Drug use: No   Sexual activity: Not on file  Other Topics Concern   Not on file  Social History Narrative    Not on file   Social Drivers of Health   Financial Resource Strain: Low Risk  (06/15/2024)   Received from [facility]   Overall Financial Resource Strain (CARDIA)    Difficulty of Paying Living Expenses: Not hard at all  Food Insecurity: No Food Insecurity (07/27/2024)   Hunger Vital Sign    Worried About Running Out of Food in the Last Year: Never true    Ran Out of Food in the Last Year: Never true  Transportation Needs: No Transportation Needs (07/27/2024)   PRAPARE - Administrator, Civil Service (Medical): No    Lack of Transportation (Non-Medical): No  Physical Activity: Not on file  Stress: Not on file  Social Connections: Not on file  Intimate Partner Violence: Not At Risk (07/27/2024)   Humiliation, Afraid, Rape, and Kick questionnaire    Fear of Current or Ex-Partner: No    Emotionally Abused: No    Physically Abused: No    Sexually Abused: No    Immunization status: Immunization History  Administered Date(s) Administered   PFIZER(Purple Top)SARS-COV-2 Vaccination 03/09/2020, 04/02/2020    Current Meds:  Current Outpatient Medications:    acetaminophen  (TYLENOL ) 500 MG tablet, Take 500-1,000 mg by mouth 2 (two) times daily as needed for moderate pain (pain score 4-6), fever or headache., Disp: , Rfl:    albuterol  (VENTOLIN  HFA) 108 (90 Base) MCG/ACT inhaler, Inhale 2 puffs into the lungs every 6 (six) hours as needed for wheezing or shortness of breath., Disp: , Rfl:    amLODipine  (NORVASC ) 5 MG tablet, Take 1 tablet (5 mg total) by mouth daily. (Patient taking differently: Take 10 mg by mouth daily.), Disp: 180 tablet, Rfl: 3   aspirin  EC 81 MG tablet, Take 81 mg by mouth daily., Disp: , Rfl:    atorvastatin  (LIPITOR) 20 MG tablet, Take 1 tablet (20 mg total) by mouth daily., Disp: 90 tablet, Rfl: 3   buPROPion  (WELLBUTRIN  XL) 150 MG 24 hr tablet, Take 150 mg by mouth daily., Disp: , Rfl:    Cholecalciferol  (VITAMIN D3) 125 MCG (5000 UT) CAPS, Take 5,000  Units by mouth daily., Disp: , Rfl:    cyanocobalamin  1000 MCG tablet, Take 1 tablet (1,000 mcg total) by mouth daily., Disp: 90 tablet, Rfl: 1   Evolocumab  (REPATHA  SURECLICK) 140 MG/ML SOAJ, Inject 140 mg into the skin every 14 (fourteen) days., Disp: 6 mL, Rfl: 3   levothyroxine  (SYNTHROID ) 100 MCG tablet, Take 100 mcg by mouth daily before breakfast., Disp: , Rfl:    metoprolol  tartrate (LOPRESSOR ) 50 MG tablet, Take 25 mg by mouth 2 (two) times daily., Disp: , Rfl:    mirtazapine  (REMERON ) 30 MG tablet, Take 30 mg by mouth at  bedtime., Disp: , Rfl:    pregabalin  (LYRICA ) 50 MG capsule, Take 2 capsules (100 mg total) by mouth at bedtime., Disp: 30 capsule, Rfl: 0   umeclidinium-vilanterol (ANORO ELLIPTA ) 62.5-25 MCG/ACT AEPB, Inhale 1 puff into the lungs daily., Disp: 1 each, Rfl: 5      Objective:  BP 122/74   Pulse 61   Ht 5' 5 (1.651 m)   Wt 155 lb (70.3 kg)   SpO2 97%   BMI 25.79 kg/m  BMI Readings from Last 3 Encounters:  09/22/24 25.79 kg/m  08/29/24 25.99 kg/m  08/09/24 25.92 kg/m    General: not in distress patient appearing healthy Lungs: clear to auscultation bilaterally.  Heart: regular rate rhythm, no murmur appreciated.  Abdomen: non tender, non distended. Normal BS.  Neuro: axox3.  Moves all extremities.    Diagnostic Review:  Last metabolic panel Lab Results  Component Value Date   GLUCOSE 93 07/29/2024   NA 142 07/29/2024   K 3.9 07/29/2024   CL 107 07/29/2024   CO2 23 07/29/2024   BUN 18 07/29/2024   CREATININE 1.05 (H) 07/29/2024   GFRNONAA 60 (L) 07/29/2024   CALCIUM  9.4 07/29/2024   PHOS 5.0 (H) 07/17/2024   PROT 6.6 07/27/2024   ALBUMIN  2.7 (L) 07/27/2024   BILITOT 1.1 07/27/2024   ALKPHOS 87 07/27/2024   AST 21 07/27/2024   ALT 24 07/27/2024   ANIONGAP 12 07/29/2024       Assessment & Plan:   Assessment & Plan Combined pulmonary fibrosis and emphysema (CPFE) (HCC) Lung function test showing normal spirometry but moderately  reduced DLCO. CT with significant emphysema. Continue Anoro Ellipta  and albuterol . Abnormal PFT Decreased DLCO likely due to CPFE.  Echo August 2025 did not show concerns for elevated right heart pressures. Monitor with repeat PFT in 6 months. Pneumonia of left lower lobe due to Pseudomonas species [facility]) Repeat CT scan 09/2024 with marked improvement left lower lobe infiltrate. OSA (obstructive sleep apnea) Nocturnal hypoxia No significant symptoms.  Previously mild OSA but significant desaturation. Patient does not want to be tested for OSA.  Nocturnal hypoxia due to emphysema.  Patient does not want to get tested for this currently.  She will reach out to us  if she is having more symptoms.  Discussed with the patient changes that can occur with chronic hypoxemia..   Orders Placed This Encounter  Procedures   Pulmonary Function Test    30 min.   Return for sch after PFTs.   Verlena Marlette, MD

## 2024-09-22 NOTE — Assessment & Plan Note (Signed)
 Repeat CT scan 09/2024 with marked improvement left lower lobe infiltrate.

## 2024-09-22 NOTE — Assessment & Plan Note (Deleted)
 Lung function test showing normal spirometry but moderately reduced DLCO. Continue Anoro Ellipta  and albuterol .

## 2024-09-22 NOTE — Assessment & Plan Note (Signed)
 Lung function test showing normal spirometry but moderately reduced DLCO. CT with significant emphysema. Continue Anoro Ellipta  and albuterol .

## 2024-09-22 NOTE — Patient Instructions (Signed)
 Notification of test results are managed in the following manner: If there are any recommendations or changes to the plan of care discussed in office today, we will contact you and let you know what they are. If you do not hear from us , then your results are normal/expected and you can view them through your MyChart account, or a letter will be sent to you. Thank you again for trusting us  with your care [facility].

## 2024-10-28 LAB — COLOGUARD: COLOGUARD: NEGATIVE

## 2024-11-18 ENCOUNTER — Ambulatory Visit: Admitting: Cardiovascular Disease

## 2024-11-21 ENCOUNTER — Encounter: Payer: Self-pay | Admitting: Cardiovascular Disease

## 2024-11-21 ENCOUNTER — Ambulatory Visit: Attending: Cardiovascular Disease | Admitting: Cardiovascular Disease

## 2024-11-21 VITALS — BP 129/68 | HR 60 | Ht 64.0 in | Wt 163.1 lb

## 2024-11-21 DIAGNOSIS — E782 Mixed hyperlipidemia: Secondary | ICD-10-CM | POA: Diagnosis present

## 2024-11-21 DIAGNOSIS — E785 Hyperlipidemia, unspecified: Secondary | ICD-10-CM

## 2024-11-21 DIAGNOSIS — I309 Acute pericarditis, unspecified: Secondary | ICD-10-CM | POA: Diagnosis present

## 2024-11-21 DIAGNOSIS — I10 Essential (primary) hypertension: Secondary | ICD-10-CM | POA: Insufficient documentation

## 2024-11-21 MED ORDER — METOPROLOL SUCCINATE ER 50 MG PO TB24: 50.0000 mg | ORAL_TABLET | Freq: Every day | ORAL | 3 refills | Status: AC

## 2024-11-21 MED ORDER — AMLODIPINE BESYLATE 5 MG PO TABS: 10.0000 mg | ORAL_TABLET | Freq: Every day | ORAL | 3 refills | Status: DC

## 2024-11-21 MED ORDER — ATORVASTATIN CALCIUM 20 MG PO TABS: 20.0000 mg | ORAL_TABLET | Freq: Every day | ORAL | 3 refills | Status: AC

## 2024-11-21 MED ORDER — REPATHA SURECLICK 140 MG/ML ~~LOC~~ SOAJ: 140.0000 mg | SUBCUTANEOUS | 3 refills | Status: AC

## 2024-11-21 NOTE — Progress Notes (Unsigned)
°  Cardiology Office Note   Date:  11/21/2024  ID:  Burnett Lieber, DOB [birth year], MRN [id number] PCP: Burney Darice CROME, MD  [facility] Providers Cardiologist:  Jerel Balding, MD { Click to update primary MD,subspecialty MD or APP then REFRESH:1}    History of Present Illness Cheyenne Lucero is a 63 y.o. female ***  ROS: ***  Studies Reviewed      *** Risk Assessment/Calculations {Does this patient have ATRIAL FIBRILLATION?:[phone]}         Physical Exam VS:  BP 129/68 (BP Location: Left Arm, Patient Position: Sitting, Cuff Size: Normal)   Pulse 60   Ht 5' 4 (1.626 m)   Wt 163 lb 1.6 oz (74 kg)   SpO2 94%   BMI 28.00 kg/m        Wt Readings from Last 3 Encounters:  11/21/24 163 lb 1.6 oz (74 kg)  09/22/24 155 lb (70.3 kg)  08/29/24 151 lb 6.4 oz (68.7 kg)    GEN: Well nourished, well developed in no acute distress NECK: No JVD; No carotid bruits CARDIAC: ***RRR, no murmurs, rubs, gallops RESPIRATORY:  Clear to auscultation without rales, wheezing or rhonchi  ABDOMEN: Soft, non-tender, non-distended EXTREMITIES:  No edema; No deformity   ASSESSMENT AND PLAN Postpneumonic acute pericarditis: Resolved, unlikely to leave sequelae.  Hypercholesterolemia Elevated cholesterol levels. Previously trialed Repatha  with good results but insurance issues delayed initiation. Atorvastatin  is well-tolerated without side effects. Repatha  can be used in conjunction with atorvastatin  for synergistic effect, reducing LDL by approximately 50% each. HDL remains low at 31, which is not modifiable with current medications. - Initiated Repatha  every two weeks - Continue atorvastatin  - Ordered lipid panel in mid-March to assess cholesterol levels - Encouraged 2.5 hours of moderate exercise per week to improve HDL levels  Hypertension Blood pressure is well-controlled on current regimen. Previously on amlodipine  5 mg, now increased to 10 mg. Metoprolol  regimen  adjusted to once daily succinate form for convenience. - Prescribed amlodipine  10 mg - Switched metoprolol  to succinate form for once daily dosing - Transferred prescriptions to preferred pharmacy  [facility]   {Are you ordering a CV Procedure (e.g. stress test, cath, DCCV, TEE, etc)?   Press F2        :[phone]}  Dispo: ***  Signed, Jerel Balding, MD

## 2024-11-21 NOTE — Patient Instructions (Addendum)
 Medication Instructions:  Stop Metoprolol  Tartrate Start Metoprolol  Succinate 50 mg daily Start Amlodipine  10 mg daily Repatha  140 mg every 14 days, subcutaneous injection *If you need a refill on your cardiac medications before your next appointment, please call your pharmacy*  Lab Work: Lipid panel- fasting labs in 3 months If you have labs (blood work) drawn today and your tests are completely normal, you will receive your results only by: MyChart Message (if you have MyChart) OR A paper copy in the mail If you have any lab test that is abnormal or we need to change your treatment, we will call you to review the results.  Testing/Procedures: None ordered  Follow-Up: At [facility], you and your health needs are our priority.  As part of our continuing mission to provide you with exceptional heart care, our providers are all part of one team.  This team includes your primary Cardiologist (physician) and Advanced Practice Providers or APPs (Physician Assistants and Nurse Practitioners) who all work together to provide you with the care you need, when you need it.  Your next appointment:   1 year(s)  Provider:   Jerel Balding, MD    We recommend signing up for the patient portal called MyChart.  Sign up information is provided on this After Visit Summary.  MyChart is used to connect with patients for Virtual Visits (Telemedicine).  Patients are able to view lab/test results, encounter notes, upcoming appointments, etc.  Non-urgent messages can be sent to your provider as well.   To learn more about what you can do with MyChart, go to [email or web address].

## 2024-11-23 ENCOUNTER — Encounter: Payer: Self-pay | Admitting: Cardiovascular Disease

## 2024-11-23 MED ORDER — AMLODIPINE BESYLATE 5 MG PO TABS: 10.0000 mg | ORAL_TABLET | Freq: Every day | ORAL | 3 refills | Status: AC

## 2025-03-06 ENCOUNTER — Ambulatory Visit

## 2025-03-06 ENCOUNTER — Encounter
